# Patient Record
Sex: Male | Born: 1955 | Race: White | Hispanic: No | Marital: Married | Smoking: Former smoker
Health system: Southern US, Community
[De-identification: ages and names within clinical notes are randomized; demographics above are authoritative.]

## PROBLEM LIST (undated history)

## (undated) DIAGNOSIS — I10 Essential (primary) hypertension: Secondary | ICD-10-CM

## (undated) DIAGNOSIS — M1990 Unspecified osteoarthritis, unspecified site: Secondary | ICD-10-CM

## (undated) DIAGNOSIS — E119 Type 2 diabetes mellitus without complications: Secondary | ICD-10-CM

## (undated) DIAGNOSIS — I2510 Atherosclerotic heart disease of native coronary artery without angina pectoris: Secondary | ICD-10-CM

## (undated) DIAGNOSIS — E785 Hyperlipidemia, unspecified: Secondary | ICD-10-CM

## (undated) DIAGNOSIS — I639 Cerebral infarction, unspecified: Secondary | ICD-10-CM

## (undated) DIAGNOSIS — N209 Urinary calculus, unspecified: Secondary | ICD-10-CM

## (undated) DIAGNOSIS — C6290 Malignant neoplasm of unspecified testis, unspecified whether descended or undescended: Secondary | ICD-10-CM

## (undated) HISTORY — DX: Type 2 diabetes mellitus without complications: E11.9

## (undated) HISTORY — DX: Atherosclerotic heart disease of native coronary artery without angina pectoris: I25.10

## (undated) HISTORY — DX: Urinary calculus, unspecified: N20.9

## (undated) HISTORY — DX: Unspecified osteoarthritis, unspecified site: M19.90

## (undated) HISTORY — DX: Essential (primary) hypertension: I10

## (undated) HISTORY — DX: Hyperlipidemia, unspecified: E78.5

## (undated) HISTORY — PX: OTHER SURGICAL HISTORY: SHX169

## (undated) HISTORY — DX: Malignant neoplasm of unspecified testis, unspecified whether descended or undescended: C62.90

## (undated) HISTORY — DX: Cerebral infarction, unspecified: I63.9

## (undated) HISTORY — PX: COLONOSCOPY: SHX174

---

## 1976-09-28 DIAGNOSIS — C6290 Malignant neoplasm of unspecified testis, unspecified whether descended or undescended: Secondary | ICD-10-CM

## 1976-09-28 HISTORY — DX: Malignant neoplasm of unspecified testis, unspecified whether descended or undescended: C62.90

## 2003-09-29 HISTORY — PX: CORONARY STENT PLACEMENT: SHX1402

## 2004-07-14 ENCOUNTER — Inpatient Hospital Stay (HOSPITAL_COMMUNITY): Admission: AD | Admit: 2004-07-14 | Discharge: 2004-07-16 | Payer: Self-pay | Admitting: Endocrinology

## 2004-08-06 ENCOUNTER — Ambulatory Visit: Payer: Self-pay | Admitting: Cardiology

## 2004-09-03 ENCOUNTER — Ambulatory Visit: Payer: Self-pay | Admitting: Endocrinology

## 2004-11-24 ENCOUNTER — Ambulatory Visit: Payer: Self-pay | Admitting: Endocrinology

## 2004-11-28 ENCOUNTER — Ambulatory Visit: Payer: Self-pay | Admitting: Cardiology

## 2004-12-01 ENCOUNTER — Ambulatory Visit: Payer: Self-pay | Admitting: Endocrinology

## 2005-04-23 ENCOUNTER — Encounter: Payer: Self-pay | Admitting: Cardiology

## 2005-05-11 ENCOUNTER — Ambulatory Visit: Payer: Self-pay | Admitting: Endocrinology

## 2005-05-18 ENCOUNTER — Ambulatory Visit: Payer: Self-pay | Admitting: Endocrinology

## 2005-06-29 ENCOUNTER — Ambulatory Visit: Payer: Self-pay | Admitting: Cardiology

## 2005-07-06 ENCOUNTER — Ambulatory Visit: Payer: Self-pay | Admitting: Cardiology

## 2006-07-09 ENCOUNTER — Ambulatory Visit: Payer: Self-pay | Admitting: Endocrinology

## 2006-07-09 LAB — CONVERTED CEMR LAB
ALT: 22 units/L (ref 0–40)
AST: 23 units/L (ref 0–37)
Albumin: 3.6 g/dL (ref 3.5–5.2)
Alkaline Phosphatase: 74 units/L (ref 39–117)
BUN: 17 mg/dL (ref 6–23)
Basophils Absolute: 0 10*3/uL (ref 0.0–0.1)
Basophils Relative: 0.6 % (ref 0.0–1.0)
Bilirubin Urine: NEGATIVE
CO2: 27 meq/L (ref 19–32)
Calcium: 9.7 mg/dL (ref 8.4–10.5)
Chloride: 108 meq/L (ref 96–112)
Chol/HDL Ratio, serum: 3.1
Cholesterol: 127 mg/dL (ref 0–200)
Creatinine, Ser: 1.1 mg/dL (ref 0.4–1.5)
Eosinophil percent: 2 % (ref 0.0–5.0)
GFR calc non Af Amer: 75 mL/min
Glomerular Filtration Rate, Af Am: 90 mL/min/{1.73_m2}
Glucose, Bld: 107 mg/dL — ABNORMAL HIGH (ref 70–99)
HCT: 43.5 % (ref 39.0–52.0)
HDL: 40.6 mg/dL (ref >39.0)
Hemoglobin, Urine: NEGATIVE
Hemoglobin: 14.4 g/dL (ref 13.0–17.0)
Ketones, ur: NEGATIVE mg/dL
LDL Cholesterol: 67 mg/dL (ref 0–99)
Leukocytes, UA: NEGATIVE
Lymphocytes Relative: 24.1 % (ref 12.0–46.0)
MCHC: 33 g/dL (ref 30.0–36.0)
MCV: 91.8 fL (ref 78.0–100.0)
Monocytes Absolute: 0.5 10*3/uL (ref 0.2–0.7)
Monocytes Relative: 5.9 % (ref 3.0–11.0)
Neutro Abs: 5.1 10*3/uL (ref 1.4–7.7)
Neutrophils Relative %: 67.4 % (ref 43.0–77.0)
Nitrite: NEGATIVE
PSA: 0.22 ng/mL (ref 0.10–4.00)
Platelets: 109 10*3/uL — ABNORMAL LOW (ref 150–400)
Potassium: 5.4 meq/L — ABNORMAL HIGH (ref 3.5–5.1)
RBC: 4.74 M/uL (ref 4.22–5.81)
RDW: 13.4 % (ref 11.5–14.6)
Sodium: 142 meq/L (ref 135–145)
Specific Gravity, Urine: 1.02 (ref 1.000–1.03)
TSH: 1 microintl units/mL (ref 0.35–5.50)
Total Bilirubin: 0.6 mg/dL (ref 0.3–1.2)
Total Protein, Urine: NEGATIVE mg/dL
Total Protein: 6.5 g/dL (ref 6.0–8.3)
Triglyceride fasting, serum: 96 mg/dL (ref 0–149)
Urine Glucose: NEGATIVE mg/dL
Urobilinogen, UA: 0.2 (ref 0.0–1.0)
VLDL: 19 mg/dL (ref 0–40)
WBC: 7.7 10*3/uL (ref 4.5–10.5)
pH: 6 (ref 5.0–8.0)

## 2006-07-26 ENCOUNTER — Ambulatory Visit: Payer: Self-pay | Admitting: Cardiology

## 2006-07-27 ENCOUNTER — Ambulatory Visit: Payer: Self-pay | Admitting: Endocrinology

## 2007-02-25 ENCOUNTER — Ambulatory Visit: Payer: Self-pay | Admitting: Endocrinology

## 2007-02-25 LAB — CONVERTED CEMR LAB
ALT: 18 units/L (ref 0–40)
AST: 18 units/L (ref 0–37)
Albumin: 3.9 g/dL (ref 3.5–5.2)
Alkaline Phosphatase: 72 units/L (ref 39–117)
Bilirubin, Direct: 0.1 mg/dL (ref 0.0–0.3)
Cholesterol: 141 mg/dL (ref 0–200)
Creatinine,U: 134.8 mg/dL
HDL: 44.9 mg/dL (ref >39.0)
Hgb A1c MFr Bld: 6.3 % — ABNORMAL HIGH (ref 4.6–6.0)
LDL Cholesterol: 69 mg/dL (ref 0–99)
Microalb Creat Ratio: 4.5 mg/g (ref 0.0–30.0)
Microalb, Ur: 0.6 mg/dL (ref 0.0–1.9)
Total Bilirubin: 0.6 mg/dL (ref 0.3–1.2)
Total CHOL/HDL Ratio: 3.1
Total Protein: 7.3 g/dL (ref 6.0–8.3)
Triglycerides: 134 mg/dL (ref 0–149)
VLDL: 27 mg/dL (ref 0–40)

## 2007-03-11 ENCOUNTER — Ambulatory Visit: Payer: Self-pay | Admitting: Cardiology

## 2007-03-21 ENCOUNTER — Ambulatory Visit: Payer: Self-pay | Admitting: Cardiology

## 2007-04-07 ENCOUNTER — Ambulatory Visit: Payer: Self-pay | Admitting: Physician Assistant

## 2007-05-05 ENCOUNTER — Encounter: Payer: Self-pay | Admitting: Physician Assistant

## 2007-05-09 ENCOUNTER — Ambulatory Visit: Payer: Self-pay | Admitting: Cardiology

## 2007-07-02 ENCOUNTER — Encounter: Payer: Self-pay | Admitting: *Deleted

## 2007-07-02 DIAGNOSIS — K8020 Calculus of gallbladder without cholecystitis without obstruction: Secondary | ICD-10-CM | POA: Insufficient documentation

## 2007-07-02 DIAGNOSIS — I25118 Atherosclerotic heart disease of native coronary artery with other forms of angina pectoris: Secondary | ICD-10-CM | POA: Insufficient documentation

## 2007-07-02 DIAGNOSIS — E782 Mixed hyperlipidemia: Secondary | ICD-10-CM | POA: Insufficient documentation

## 2007-07-02 DIAGNOSIS — Z8547 Personal history of malignant neoplasm of testis: Secondary | ICD-10-CM | POA: Insufficient documentation

## 2007-07-02 DIAGNOSIS — I2510 Atherosclerotic heart disease of native coronary artery without angina pectoris: Secondary | ICD-10-CM | POA: Insufficient documentation

## 2007-07-02 DIAGNOSIS — F17200 Nicotine dependence, unspecified, uncomplicated: Secondary | ICD-10-CM | POA: Insufficient documentation

## 2007-07-25 ENCOUNTER — Ambulatory Visit: Payer: Self-pay | Admitting: Endocrinology

## 2007-07-25 LAB — CONVERTED CEMR LAB
Hgb A1c MFr Bld: 6.2 % — ABNORMAL HIGH (ref 4.6–6.0)
Sed Rate: 23 mm/hr — ABNORMAL HIGH (ref 0–20)

## 2007-07-29 ENCOUNTER — Ambulatory Visit: Payer: Self-pay

## 2007-08-12 ENCOUNTER — Telehealth (INDEPENDENT_AMBULATORY_CARE_PROVIDER_SITE_OTHER): Payer: Self-pay | Admitting: *Deleted

## 2008-07-05 ENCOUNTER — Ambulatory Visit: Payer: Self-pay | Admitting: Endocrinology

## 2008-07-07 LAB — CONVERTED CEMR LAB
ALT: 20 units/L (ref 0–53)
AST: 22 units/L (ref 0–37)
Albumin: 3.8 g/dL (ref 3.5–5.2)
Alkaline Phosphatase: 66 units/L (ref 39–117)
BUN: 17 mg/dL (ref 6–23)
Basophils Absolute: 0 10*3/uL (ref 0.0–0.1)
Basophils Relative: 0.5 % (ref 0.0–3.0)
Bilirubin Urine: NEGATIVE
Bilirubin, Direct: 0.1 mg/dL (ref 0.0–0.3)
CO2: 29 meq/L (ref 19–32)
Calcium: 9.7 mg/dL (ref 8.4–10.5)
Chloride: 103 meq/L (ref 96–112)
Cholesterol: 146 mg/dL (ref 0–200)
Creatinine, Ser: 1 mg/dL (ref 0.4–1.5)
Creatinine,U: 123.6 mg/dL
Eosinophils Absolute: 0.2 10*3/uL (ref 0.0–0.7)
Eosinophils Relative: 2.1 % (ref 0.0–5.0)
GFR calc Af Amer: 100 mL/min
GFR calc non Af Amer: 83 mL/min
Glucose, Bld: 120 mg/dL — ABNORMAL HIGH (ref 70–99)
HCT: 45.5 % (ref 39.0–52.0)
HDL: 41.4 mg/dL (ref >39.0)
Hemoglobin, Urine: NEGATIVE
Hemoglobin: 15.4 g/dL (ref 13.0–17.0)
Hgb A1c MFr Bld: 6.2 % — ABNORMAL HIGH (ref 4.6–6.0)
Ketones, ur: NEGATIVE mg/dL
LDL Cholesterol: 79 mg/dL (ref 0–99)
Leukocytes, UA: NEGATIVE
Lymphocytes Relative: 22.5 % (ref 12.0–46.0)
MCHC: 33.9 g/dL (ref 30.0–36.0)
MCV: 93.2 fL (ref 78.0–100.0)
Microalb Creat Ratio: 1.6 mg/g (ref 0.0–30.0)
Microalb, Ur: 0.2 mg/dL (ref 0.0–1.9)
Monocytes Absolute: 0.4 10*3/uL (ref 0.1–1.0)
Monocytes Relative: 5.8 % (ref 3.0–12.0)
Neutro Abs: 5.1 10*3/uL (ref 1.4–7.7)
Neutrophils Relative %: 69.1 % (ref 43.0–77.0)
Nitrite: NEGATIVE
PSA: 0.29 ng/mL (ref 0.10–4.00)
Platelets: 126 10*3/uL — ABNORMAL LOW (ref 150–400)
Potassium: 4.9 meq/L (ref 3.5–5.1)
RBC: 4.88 M/uL (ref 4.22–5.81)
RDW: 13.3 % (ref 11.5–14.6)
Sodium: 140 meq/L (ref 135–145)
Specific Gravity, Urine: 1.015 (ref 1.000–1.03)
TSH: 2.11 microintl units/mL (ref 0.35–5.50)
Total Bilirubin: 0.7 mg/dL (ref 0.3–1.2)
Total CHOL/HDL Ratio: 3.5
Total Protein, Urine: NEGATIVE mg/dL
Total Protein: 7.1 g/dL (ref 6.0–8.3)
Triglycerides: 127 mg/dL (ref 0–149)
Urine Glucose: NEGATIVE mg/dL
Urobilinogen, UA: 0.2 (ref 0.0–1.0)
VLDL: 25 mg/dL (ref 0–40)
WBC: 7.3 10*3/uL (ref 4.5–10.5)
pH: 7 (ref 5.0–8.0)

## 2008-07-13 ENCOUNTER — Ambulatory Visit: Payer: Self-pay | Admitting: Endocrinology

## 2008-07-13 DIAGNOSIS — R609 Edema, unspecified: Secondary | ICD-10-CM | POA: Insufficient documentation

## 2008-09-03 ENCOUNTER — Ambulatory Visit: Payer: Self-pay | Admitting: Cardiology

## 2008-09-27 ENCOUNTER — Ambulatory Visit: Payer: Self-pay | Admitting: Internal Medicine

## 2008-10-02 ENCOUNTER — Telehealth: Payer: Self-pay | Admitting: Endocrinology

## 2008-10-04 ENCOUNTER — Ambulatory Visit: Payer: Self-pay | Admitting: Internal Medicine

## 2008-10-04 ENCOUNTER — Telehealth: Payer: Self-pay | Admitting: Endocrinology

## 2008-10-04 LAB — HM COLONOSCOPY

## 2009-06-13 ENCOUNTER — Ambulatory Visit: Payer: Self-pay | Admitting: Endocrinology

## 2009-06-13 DIAGNOSIS — D696 Thrombocytopenia, unspecified: Secondary | ICD-10-CM | POA: Insufficient documentation

## 2009-06-13 DIAGNOSIS — I10 Essential (primary) hypertension: Secondary | ICD-10-CM | POA: Insufficient documentation

## 2009-06-17 ENCOUNTER — Telehealth: Payer: Self-pay | Admitting: Endocrinology

## 2009-06-21 ENCOUNTER — Ambulatory Visit: Payer: Self-pay | Admitting: Cardiology

## 2009-06-21 ENCOUNTER — Encounter (INDEPENDENT_AMBULATORY_CARE_PROVIDER_SITE_OTHER): Payer: Self-pay | Admitting: *Deleted

## 2009-06-21 DIAGNOSIS — R079 Chest pain, unspecified: Secondary | ICD-10-CM | POA: Insufficient documentation

## 2009-07-08 ENCOUNTER — Encounter: Payer: Self-pay | Admitting: Cardiology

## 2009-07-08 ENCOUNTER — Ambulatory Visit: Payer: Self-pay | Admitting: Cardiology

## 2009-07-15 ENCOUNTER — Encounter: Payer: Self-pay | Admitting: Endocrinology

## 2009-07-15 ENCOUNTER — Ambulatory Visit: Payer: Self-pay | Admitting: Endocrinology

## 2009-07-15 DIAGNOSIS — M542 Cervicalgia: Secondary | ICD-10-CM | POA: Insufficient documentation

## 2009-07-25 ENCOUNTER — Telehealth: Payer: Self-pay | Admitting: Endocrinology

## 2009-08-30 ENCOUNTER — Telehealth: Payer: Self-pay | Admitting: Endocrinology

## 2009-09-11 ENCOUNTER — Telehealth: Payer: Self-pay | Admitting: Endocrinology

## 2009-09-17 ENCOUNTER — Telehealth: Payer: Self-pay | Admitting: Endocrinology

## 2009-12-06 ENCOUNTER — Ambulatory Visit: Payer: Self-pay | Admitting: Cardiology

## 2009-12-19 ENCOUNTER — Telehealth: Payer: Self-pay | Admitting: Endocrinology

## 2010-02-04 ENCOUNTER — Encounter: Payer: Self-pay | Admitting: Cardiology

## 2010-03-27 ENCOUNTER — Encounter: Payer: Self-pay | Admitting: Endocrinology

## 2010-04-01 ENCOUNTER — Telehealth: Payer: Self-pay | Admitting: Endocrinology

## 2010-04-02 ENCOUNTER — Telehealth: Payer: Self-pay | Admitting: Endocrinology

## 2010-04-02 ENCOUNTER — Ambulatory Visit: Payer: Self-pay | Admitting: Endocrinology

## 2010-04-02 DIAGNOSIS — I6350 Cerebral infarction due to unspecified occlusion or stenosis of unspecified cerebral artery: Secondary | ICD-10-CM | POA: Insufficient documentation

## 2010-04-02 DIAGNOSIS — M1990 Unspecified osteoarthritis, unspecified site: Secondary | ICD-10-CM | POA: Insufficient documentation

## 2010-05-23 ENCOUNTER — Encounter: Payer: Self-pay | Admitting: Endocrinology

## 2010-07-07 ENCOUNTER — Telehealth: Payer: Self-pay | Admitting: Endocrinology

## 2010-08-01 ENCOUNTER — Ambulatory Visit: Payer: Self-pay | Admitting: Endocrinology

## 2010-08-02 LAB — CONVERTED CEMR LAB
ALT: 27 units/L (ref 0–53)
AST: 25 units/L (ref 0–37)
Albumin: 3.7 g/dL (ref 3.5–5.2)
Alkaline Phosphatase: 63 units/L (ref 39–117)
BUN: 14 mg/dL (ref 6–23)
Basophils Absolute: 0 10*3/uL (ref 0.0–0.1)
Basophils Relative: 0.6 % (ref 0.0–3.0)
Bilirubin Urine: NEGATIVE
Bilirubin, Direct: 0.1 mg/dL (ref 0.0–0.3)
CO2: 27 meq/L (ref 19–32)
Calcium: 9.4 mg/dL (ref 8.4–10.5)
Chloride: 106 meq/L (ref 96–112)
Cholesterol: 143 mg/dL (ref 0–200)
Creatinine, Ser: 1.1 mg/dL (ref 0.4–1.5)
Eosinophils Absolute: 0.2 10*3/uL (ref 0.0–0.7)
Eosinophils Relative: 3.1 % (ref 0.0–5.0)
GFR calc non Af Amer: 73.57 mL/min (ref >60)
Glucose, Bld: 101 mg/dL — ABNORMAL HIGH (ref 70–99)
HCT: 41.6 % (ref 39.0–52.0)
HDL: 52.7 mg/dL (ref >39.00)
Hemoglobin, Urine: NEGATIVE
Hemoglobin: 14.4 g/dL (ref 13.0–17.0)
Hgb A1c MFr Bld: 6.6 % — ABNORMAL HIGH (ref 4.6–6.5)
Ketones, ur: NEGATIVE mg/dL
LDL Cholesterol: 70 mg/dL (ref 0–99)
Leukocytes, UA: NEGATIVE
Lymphocytes Relative: 34.9 % (ref 12.0–46.0)
Lymphs Abs: 1.7 10*3/uL (ref 0.7–4.0)
MCHC: 34.7 g/dL (ref 30.0–36.0)
MCV: 93 fL (ref 78.0–100.0)
Monocytes Absolute: 0.4 10*3/uL (ref 0.1–1.0)
Monocytes Relative: 8.5 % (ref 3.0–12.0)
Neutro Abs: 2.6 10*3/uL (ref 1.4–7.7)
Neutrophils Relative %: 52.9 % (ref 43.0–77.0)
Nitrite: NEGATIVE
PSA: 0.18 ng/mL (ref 0.10–4.00)
Platelets: 139 10*3/uL — ABNORMAL LOW (ref 150.0–400.0)
Potassium: 5.4 meq/L — ABNORMAL HIGH (ref 3.5–5.1)
RBC: 4.47 M/uL (ref 4.22–5.81)
RDW: 13.7 % (ref 11.5–14.6)
Sodium: 140 meq/L (ref 135–145)
Specific Gravity, Urine: <=1.005 (ref 1.000–1.030)
TSH: 1.27 microintl units/mL (ref 0.35–5.50)
Total Bilirubin: 0.6 mg/dL (ref 0.3–1.2)
Total CHOL/HDL Ratio: 3
Total Protein, Urine: NEGATIVE mg/dL
Total Protein: 6.5 g/dL (ref 6.0–8.3)
Triglycerides: 102 mg/dL (ref 0.0–149.0)
Urine Glucose: NEGATIVE mg/dL
Urobilinogen, UA: 0.2 (ref 0.0–1.0)
VLDL: 20.4 mg/dL (ref 0.0–40.0)
WBC: 4.9 10*3/uL (ref 4.5–10.5)
pH: 6.5 (ref 5.0–8.0)

## 2010-08-08 ENCOUNTER — Ambulatory Visit: Payer: Self-pay | Admitting: Endocrinology

## 2010-08-08 ENCOUNTER — Encounter: Payer: Self-pay | Admitting: Endocrinology

## 2010-08-08 DIAGNOSIS — B079 Viral wart, unspecified: Secondary | ICD-10-CM | POA: Insufficient documentation

## 2010-10-26 LAB — CONVERTED CEMR LAB
ALT: 21 units/L (ref 0–53)
AST: 21 units/L (ref 0–37)
Albumin: 3.8 g/dL (ref 3.5–5.2)
Alkaline Phosphatase: 67 units/L (ref 39–117)
BUN: 12 mg/dL (ref 6–23)
Basophils Absolute: 0.2 10*3/uL — ABNORMAL HIGH (ref 0.0–0.1)
Basophils Relative: 3.1 % — ABNORMAL HIGH (ref 0.0–3.0)
Bilirubin Urine: NEGATIVE
Bilirubin, Direct: 0.1 mg/dL (ref 0.0–0.3)
CO2: 32 meq/L (ref 19–32)
Calcium: 9.8 mg/dL (ref 8.4–10.5)
Chloride: 104 meq/L (ref 96–112)
Cholesterol: 144 mg/dL (ref 0–200)
Creatinine, Ser: 1 mg/dL (ref 0.4–1.5)
Creatinine,U: 155.1 mg/dL
Eosinophils Absolute: 0.1 10*3/uL (ref 0.0–0.7)
Eosinophils Relative: 1.9 % (ref 0.0–5.0)
GFR calc non Af Amer: 82.46 mL/min (ref >60)
Glucose, Bld: 166 mg/dL — ABNORMAL HIGH (ref 70–99)
HCT: 44.5 % (ref 39.0–52.0)
HDL: 46.1 mg/dL (ref >39.00)
Hemoglobin, Urine: NEGATIVE
Hemoglobin: 15.3 g/dL (ref 13.0–17.0)
Hgb A1c MFr Bld: 6.3 % (ref 4.6–6.5)
Ketones, ur: NEGATIVE mg/dL
LDL Cholesterol: 71 mg/dL (ref 0–99)
Leukocytes, UA: NEGATIVE
Lymphocytes Relative: 27.9 % (ref 12.0–46.0)
Lymphs Abs: 2.1 10*3/uL (ref 0.7–4.0)
MCHC: 34.4 g/dL (ref 30.0–36.0)
MCV: 92.1 fL (ref 78.0–100.0)
Microalb Creat Ratio: 8.4 mg/g (ref 0.0–30.0)
Microalb, Ur: 1.3 mg/dL (ref 0.0–1.9)
Monocytes Absolute: 0.4 10*3/uL (ref 0.1–1.0)
Monocytes Relative: 5.4 % (ref 3.0–12.0)
Neutro Abs: 4.7 10*3/uL (ref 1.4–7.7)
Neutrophils Relative %: 61.7 % (ref 43.0–77.0)
Nitrite: NEGATIVE
Platelets: 126 10*3/uL — ABNORMAL LOW (ref 150.0–400.0)
Potassium: 4.2 meq/L (ref 3.5–5.1)
RBC: 4.83 M/uL (ref 4.22–5.81)
RDW: 13.7 % (ref 11.5–14.6)
Sodium: 143 meq/L (ref 135–145)
Specific Gravity, Urine: 1.02 (ref 1.000–1.030)
TSH: 1.39 microintl units/mL (ref 0.35–5.50)
Total Bilirubin: 0.7 mg/dL (ref 0.3–1.2)
Total CHOL/HDL Ratio: 3
Total Protein, Urine: NEGATIVE mg/dL
Total Protein: 7.3 g/dL (ref 6.0–8.3)
Triglycerides: 135 mg/dL (ref 0.0–149.0)
Urine Glucose: NEGATIVE mg/dL
Urobilinogen, UA: 0.2 (ref 0.0–1.0)
VLDL: 27 mg/dL (ref 0.0–40.0)
WBC: 7.5 10*3/uL (ref 4.5–10.5)
pH: 6 (ref 5.0–8.0)

## 2010-10-28 NOTE — Letter (Signed)
Summary: [facility]  [facility]   Imported By: Lester Aberdeen 04/07/2010 09:18:41  _____________________________________________________________________  External Attachment:    Type:   Image     Comment:   External Document

## 2010-10-28 NOTE — Assessment & Plan Note (Signed)
Summary: PHYSICAL  STC   Vital Signs:  Patient profile:   55 year old male Height:      72 inches (182.88 cm) Weight:      271.38 pounds (123.35 kg) BMI:     36.94 O2 Sat:      92 % on Room air Temp:     98.1 degrees F (36.72 degrees C) oral Pulse rate:   80 / minute BP sitting:   132 / 72  (left arm) Cuff size:   large  Vitals Entered By: Brenton Grills CMA Duncan Dull) (August 08, 2010 3:50 PM)  O2 Flow:  Room air CC: Physical/pt declined flu shot/aj Is Patient Diabetic? Yes   Primary Provider:  Dr. Romero Belling  CC:  Physical/pt declined flu shot/aj.  History of Present Illness: here for regular wellness examination.  He's feeling pretty well in general, and does not drink or smoke.  he wants to taper off the wellbutrin, as he quit smoking 3 mos ago.  Current Medications (verified): 1)  Fish Oil 1000 Mg  Caps (Omega-3 Fatty Acids) .... Take 1 By Mouth Qd 2)  Glucophage 500 Mg  Tabs (Metformin Hcl) .... Take 1 By Mouth Two Times A Day Qd 3)  Zocor 80 Mg  Tabs (Simvastatin) .... Take 1 Tab By Mouth At Bedtime 4)  Actos 30 Mg Tabs (Pioglitazone Hcl) .... Once Daily.qdtab 5)  Celebrex 200 Mg Caps (Celecoxib) .Marland Kitchen.. 1 Qd 6)  Nitrostat 0.4 Mg Subl (Nitroglycerin) .Marland Kitchen.. 1 Tablet Under Tongue At Onset of Chest Pain; You May Repeat Every 5 Minutes For Up To 3 Doses. 7)  Aggrenox 25-200 Mg Xr12h-Cap (Aspirin-Dipyridamole) .Marland Kitchen.. 1 Two Times A Day 8)  Bupropion Hcl 200 Mg Xr12h-Tab (Bupropion Hcl) .Marland Kitchen.. 1 Once Daily 9)  Tramadol Hcl 50 Mg Tabs (Tramadol Hcl) .Marland Kitchen.. 1 Every 4 Hrs As Needed For Pain  Allergies (verified): No Known Drug Allergies  Past History:  Past Medical History: Last updated: 12/06/2009 Cholelithiasis CAD - DES RCA 10/05 Diabetes Type 2 Hyperlipidemia Hypertension Testicular cancer 1978  Family History: Reviewed history from 12/06/2009 and no changes required. No FH of Colon Cancer Family History of Diabetes: Father  Social History: Reviewed history from  12/06/2009 and no changes required. Married IT trainer Patient currently smokes. -30 cigarrettes daily Alcohol Use - no Daily Caffeine Use-5-6 cups daily Illicit Drug Use - no Patient does not get regular exercise  Review of Systems       The patient complains of weight gain.  The patient denies fever, vision loss, decreased hearing, chest pain, syncope, dyspnea on exertion, prolonged cough, headaches, abdominal pain, melena, hematochezia, severe indigestion/heartburn, hematuria, suspicious skin lesions, and depression.    Physical Exam  General:  obese.  no distress  Head:  head: no deformity eyes: no periorbital swelling, no proptosis external nose and ears are normal mouth: no lesion seen Neck:  Supple without thyroid enlargement or tenderness.  Lungs:  Clear to auscultation bilaterally. Normal respiratory effort.  Heart:  Regular rate and rhythm without murmurs or gallops noted. Normal S1,S2.   Abdomen:  abdomen is soft, nontender.  no hepatosplenomegaly.   not distended.  no hernia  Rectal:  normal external and internal exam.  heme neg  Prostate:  Normal size prostate without masses or tenderness.  Msk:  muscle bulk and strength are grossly normal.  no obvious joint swelling.  gait is normal and steady  Pulses:  dorsalis pedis intact bilat.  no carotid bruit  Extremities:  no deformity.  no ulcer on the feet.  feet are of normal color and temp.  no edema  Neurologic:  cn 2-12 grossly intact.   readily moves all 4's.   sensation is intact to touch on the feet  Skin:  normal texture and temp.  no rash.  not diaphoretic  Cervical Nodes:  No significant adenopathy.  Psych:  Alert and cooperative; normal mood and affect; normal attention span and concentration.   Additional Exam:  SEPARATE EVALUATION AND TREATMENT FOLLOWS--EACH PROBLEM HERE IS NEW, NOT RESPONDING TO TREATMENT, OR POSES SIGNIFICANT RISK TO THE PATIENT'S HEALTH:  liquid nitrogen is applied to 2 warts on the  left thumb     Impression & Recommendations:  Problem # 1:  ROUTINE GENERAL MEDICAL EXAM@HEALTH  CARE FACL (ICD-V70.0)  Medications Added to Medication List This Visit: 1)  Bupropion Hcl 100 Mg Xr12h-tab (Bupropion hcl) .Marland Kitchen.. 1 tab once daily  Other Orders: EKG w/ Interpretation (93000) Est. Patient 40-64 years ([address]) Cryotherapy/Destruction benign or premalignant lesion (1st lesion)  (17000)  Patient Instructions: 1)  reduce buproprion to 100 mg once daily.  take this x 1 month, then stop it.   2)  please consider these measures for your health:  minimize alcohol.  do not use tobacco products.  have a colonoscopy at least every 10 years from age 49.  keep firearms safely stored.  always use seat belts.  have working smoke alarms in your home.  see an eye doctor and dentist regularly.  never drive under the influence of alcohol or drugs (including prescription drugs).  those with fair skin should take precautions against the sun. 3)  Please schedule a follow-up appointment in 6 months. Prescriptions: BUPROPION HCL 100 MG XR12H-TAB (BUPROPION HCL) 1 tab once daily  #30 x 0   Entered and Authorized by:   Minus Breeding MD   Signed by:   Minus Breeding MD on 08/08/2010   Method used:   [facility]  [address] [id number]       [address]       [place], [place]  [address]       Ph: [phone] or [phone]       Fax: [phone]   RxID:   [id number]    Orders Added: 1)  EKG w/ Interpretation [93000] 2)  Est. Patient 40-64 years [99396] 3)  Cryotherapy/Destruction benign or premalignant lesion (1st lesion)  [17000]

## 2010-10-28 NOTE — Letter (Signed)
Summary: [facility]  [facility]   Imported By: Sherian Rein 05/28/2010 11:30:39  _____________________________________________________________________  External Attachment:    Type:   Image     Comment:   External Document

## 2010-10-28 NOTE — Assessment & Plan Note (Signed)
Summary: PER DAHLIA FU--STC   Vital Signs:  Patient profile:   55 year old male Height:      72 inches (182.88 cm) Weight:      249 pounds (113.18 kg) BMI:     33.89 O2 Sat:      95 % on Room air Temp:     97.3 degrees F (36.28 degrees C) oral Pulse rate:   69 / minute BP sitting:   118 / 82 Cuff size:   large  Vitals Entered By: Brenton Grills MA (April 02, 2010 11:49 AM)  O2 Flow:  Room air CC: f/u appt pt was in hosp. a few weeks ago/wants to discuss quitting smoking/pt no longer taking adult aspirin 81mg  or plavix/aj Comments pt currently taking Aggrenox SA capsule- taking 1 cap every day for 3-4 days then 1 capsule two times a day    Primary Provider:  Dr. Romero Belling  CC:  f/u appt pt was in hosp. a few weeks ago/wants to discuss quitting smoking/pt no longer taking adult aspirin 81mg  or plavix/aj.  History of Present Illness: the status of at least 3 ongoing medical problems is addressed today: pt was in hospital ([facility]) last week with a small cva. he feels better in general. risk-factors wave been well-controlled, except for smoking.  he wants to quit, but he is concerned about the risk of chantix. pt says his Zeb Comfort has been worse since he was reassigned to a different job.  Current Medications (verified): 1)  Fish Oil 1000 Mg  Caps (Omega-3 Fatty Acids) .... Take 1 By Mouth Qd 2)  Adult Aspirin Low Strength 81 Mg  Tbdp (Aspirin) .... Take 1 By Mouth Qd 3)  Plavix 75 Mg  Tabs (Clopidogrel Bisulfate) .... Take 1 By Mouth Qd 4)  Glucophage 500 Mg  Tabs (Metformin Hcl) .... Take 1 By Mouth Two Times A Day Qd 5)  Zocor 80 Mg  Tabs (Simvastatin) .... Take 1 Tab By Mouth At Bedtime 6)  Actos 30 Mg Tabs (Pioglitazone Hcl) .... Once Daily.qdtab 7)  Celebrex 200 Mg Caps (Celecoxib) .Marland Kitchen.. 1 Qd 8)  Nitrostat 0.4 Mg Subl (Nitroglycerin) .Marland Kitchen.. 1 Tablet Under Tongue At Onset of Chest Pain; You May Repeat Every 5 Minutes For Up To 3 Doses.  Allergies (verified): No Known Drug  Allergies  Past History:  Past Medical History: Last updated: 12/06/2009 Cholelithiasis CAD - DES RCA 10/05 Diabetes Type 2 Hyperlipidemia Hypertension Testicular cancer 1978  Review of Systems       no further numbness.  he has pain in the lower back with lifting  Physical Exam  General:  obese.  no distress  Pulses:  dorsalis pedis intact bilat.  Extremities:  no deformity.  no ulcer on the feet.  feet are of normal color and temp.  no edema  Neurologic:  sensation is intact to touch on the feet    Impression & Recommendations:  Problem # 1:  CVA (ICD-434.91) Assessment Improved  Problem # 2:  SMOKER (ICD-305.1) this places him at risk for another cva  Problem # 3:  OSTEOARTHRITIS (ICD-715.90) pain needs increased rx  Medications Added to Medication List This Visit: 1)  Aggrenox 25-200 Mg Xr12h-cap (Aspirin-dipyridamole) .Marland Kitchen.. 1 two times a day 2)  Bupropion Hcl 200 Mg Xr12h-tab (Bupropion hcl) .Marland Kitchen.. 1 once daily 3)  Tramadol Hcl 50 Mg Tabs (Tramadol hcl) .Marland Kitchen.. 1 every 4 hrs as needed for pain  Other Orders: Est. Patient Level IV (91478)  Patient Instructions: 1)  continue aggrenox. 2)  add welbutrin-xl 200 mg once daily.  call if you wish to increase this to 300 mg once daily. 3)  physical is due in 2-3 months. 4)  same celebrex 5)  also take tramadol 50 mg every 4 hrs as needed for pain.   Prescriptions: TRAMADOL HCL 50 MG TABS (TRAMADOL HCL) 1 every 4 hrs as needed for pain  #50 x 3   Entered and Authorized by:   Minus Breeding MD   Signed by:   Minus Breeding MD on 04/02/2010   Method used:   [facility]        [facility]  [address] [id number]       [address]       [place], [place]  [address]       Ph: [phone] or [phone]       Fax: [phone]   RxID:   [id number] BUPROPION HCL 200 MG XR12H-TAB (BUPROPION HCL) 1 once daily  #30 x 11   Entered and Authorized by:   Minus Breeding MD   Signed by:   Minus Breeding MD on 04/02/2010   Method used:   [facility]        [facility]  [address] [id number]       [address]       [place], [place]  [address]       Ph: [phone] or [phone]       Fax: [phone]   RxID:   [id number]

## 2010-10-28 NOTE — Assessment & Plan Note (Signed)
Summary: 4 MO FU PER JAN REMINDER-SRS   Visit Type:  Follow-up Primary Provider:  Dr. Romero Belling   History of Present Illness: 55 year old male presents for a followup visit. He denies any significant angina or unusual breathlessness with activity. 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 He denies using any nitroglycerin.  We reviewed his stress testing from last year, detailed below, and overall reassuring. He continues to follow with Dr. Everardo All about every 6 months. Blood pressure and heart rate are well controlled today.   Preventive Screening-Counseling & Management  Alcohol-Tobacco     Smoking Status: current     Packs/Day: 1.5     Year Started: x 42 yrs  Current Medications (verified): 1)  Fish Oil 1000 Mg  Caps (Omega-3 Fatty Acids) .... Take 1 By Mouth Qd 2)  Adult Aspirin Low Strength 81 Mg  Tbdp (Aspirin) .... Take 1 By Mouth Qd 3)  Plavix 75 Mg  Tabs (Clopidogrel Bisulfate) .... Take 1 By Mouth Qd 4)  Glucophage 500 Mg  Tabs (Metformin Hcl) .... Take 1 By Mouth Two Times A Day Qd 5)  Zocor 80 Mg  Tabs (Simvastatin) .... Take 1 Tab By Mouth At Bedtime 6)  Actos 30 Mg Tabs (Pioglitazone Hcl) .... Once Daily.qdtab 7)  Celebrex 200 Mg Caps (Celecoxib) .Marland Kitchen.. 1 Qd 8)  Nitrolingual 0.4 Mg/spray Soln (Nitroglycerin) .... 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 -30 cigarrettes daily Alcohol Use - no Daily Caffeine Use-5-6 cups daily Illicit Drug Use - no Patient does not get regular exercise  Review of Systems  The patient denies anorexia, fever, chest pain, syncope, peripheral edema, prolonged cough, headaches, hemoptysis, abdominal pain, melena, and hematochezia.         He has had some medial right elbow discomfort that sounds potentially related to tendon or ligament pain. I asked him to discuss this with his primary care provider if symptoms persist. Otherwise reviewed and negative.  Vital Signs:  Patient profile:   55 year old male Height:      72 inches Weight:      253.4 pounds Pulse rate:   53 / minute BP sitting:   119 / 72  (left arm) Cuff size:   large  Vitals Entered By: Hoover Brunette, LPN (December 06, 2009 3:46 PM) Is Patient Diabetic? Yes Comments routine f/u   Physical Exam  Additional Exam:  Overweight male in no acute distress. HEENT: Conjunctiva and lids normal, oropharynx clear. Neck: Supple, no carotid bruits or thyromegaly. Lungs: Clear to auscultation, nonlabored. Cardiac: Regular rate and rhythm, no S3. Extremity: No pitting edema, distal pulses 2+.    Nuclear Study  Procedure date:  07/08/2009  Findings:      [facility] Cardiolite:  No diagnostic ST segment changes noted. Probable inferior wall scar with very mild ischemia in the inferior apex, overall low risk. LVEF 58%.  Impression & Recommendations:  Problem #  1:  CORONARY ARTERY DISEASE (ICD-414.00)  Symptomatically stable on medical therapy with history of drug eluding stent placement to the right coronary artery in 2005. Followup Cardiolite in October of last year was overall reassuring in the absence of active symptoms. I reminded Mr. Boudoin to be observant for any change in his functional capacity, development of chest pain, or development of unusual shortness of breath. He will otherwise  continue to follow up Dr. Everardo All on medical therapy aimed at risk factor modification, I will plan to see him back in one year's time. Refills were given for nitroglycerin, Zocor, and Plavix.  His updated medication list for this problem includes:    Adult Aspirin Low Strength 81 Mg Tbdp (Aspirin) .Marland Kitchen... Take 1 by mouth qd    Plavix 75 Mg Tabs (Clopidogrel bisulfate) .Marland Kitchen... Take 1 by mouth qd    Nitrostat 0.4 Mg Subl (Nitroglycerin) .Marland Kitchen... 1 tablet under tongue at onset of chest pain; you may repeat every 5 minutes for up to 3 doses.  Problem # 2:  HYPERLIPIDEMIA (ICD-272.4)  Followed by Dr. Everardo All, and tolerating omega-3 supplements as well as Zocor. Would aim for LDL control around 70. His updated medication list for this problem includes:    Zocor 80 Mg Tabs (Simvastatin) .Marland Kitchen... Take 1 tab by mouth at bedtime  Problem # 3:  HYPERTENSION (ICD-401.9)  Blood pressure well controlled today.  His updated medication list for this problem includes:    Adult Aspirin Low Strength 81 Mg Tbdp (Aspirin) .Marland Kitchen... Take 1 by mouth qd  His updated medication list for this problem includes:    Adult Aspirin Low Strength 81 Mg Tbdp (Aspirin) .Marland Kitchen... Take 1 by mouth qd  Patient Instructions: 1)  Your physician wants you to follow-up in: 1 year. You will receive a reminder letter in the mail one-two months in advance. If you don't receive a letter, please call our office to schedule the follow-up appointment. 2)  Your physician recommends that you continue on your current medications as directed. Please refer to the Current Medication list given to you today. Prescriptions: NITROSTAT 0.4 MG SUBL (NITROGLYCERIN) 1 tablet under tongue at onset of chest pain; you may repeat every 5 minutes for up to 3 doses.  #25 x 3   Entered by:   Cyril Loosen, RN, BSN   Authorized by:   Loreli Slot, MD, [facility]   Signed by:   Cyril Loosen, RN, BSN on 12/06/2009   Method used:   [facility]        [facility]  [address]  [address] [id number]       [address]       [place], [place]  [address]       Ph: [phone] or [phone]       Fax: [phone]   RxID:   [id number] PLAVIX 75 MG  TABS (CLOPIDOGREL BISULFATE) take 1 by mouth qd  #90 x 3   Entered by:   Cyril Loosen, RN, BSN   Authorized by:   Loreli Slot, MD, [facility]   Signed by:   Cyril Loosen, RN, BSN on 12/06/2009   Method used:   Electronically to        [facility]* (mail-order)             ,          Ph: [phone]       Fax: [phone]   RxID:  [id number] ZOCOR 80 MG  TABS (SIMVASTATIN) Take 1 tab by mouth at bedtime  #90 x 3   Entered by:   Cyril Loosen, RN, BSN   Authorized by:   Loreli Slot, MD, [facility]   Signed by:   Cyril Loosen, RN, BSN on 12/06/2009   Method used:   Electronically to        [facility]* (mail-order)             ,          Ph: [phone]       Fax: [phone]   RxID:   [id number]

## 2010-10-28 NOTE — Letter (Signed)
Summary: Occupation Health: Request for Info at [facility]  Occupation Health: Request for Info at [facility]   Imported By: Cyril Loosen, RN, BSN 02/04/2010 11:06:31  _____________________________________________________________________  External Attachment:    Type:   Image     Comment:   External Document

## 2010-10-28 NOTE — Progress Notes (Signed)
  Phone Note Call from Patient Call back at [phone]   Caller: Spouse Summary of Call: Pt's spouse left message on triage about where rx's were sent--appt today Initial call taken by: Brenton Grills MA,  April 02, 2010 2:04 PM  Follow-up for Phone Call        Informed [facility] [address] [facility] [place]. Follow-up by: Brenton Grills MA,  April 02, 2010 2:05 PM

## 2010-10-28 NOTE — Progress Notes (Signed)
  Phone Note Refill Request Message from:  Fax from Pharmacy on December 19, 2009 11:29 AM  Refills Requested: Medication #1:  GLUCOPHAGE 500 MG  TABS take 1 by mouth two times a day qd   Dosage confirmed as above?Dosage Confirmed RX sent to [facility]  Initial call taken by: Josph Macho RMA,  December 19, 2009 11:29 AM    Prescriptions: GLUCOPHAGE 500 MG  TABS (METFORMIN HCL) take 1 by mouth two times a day qd  #180 x 2   Entered by:   Josph Macho RMA   Authorized by:   Minus Breeding MD   Signed by:   Josph Macho RMA on 12/19/2009   Method used:   Electronically to        [facility] (mail-order)             ,          Ph: [phone]       Fax: [phone]   RxID:   [id number]

## 2010-10-28 NOTE — Progress Notes (Signed)
Summary: Rx refill req  Phone Note Refill Request Message from:  Patient on July 07, 2010 11:14 AM  Refills Requested: Medication #1:  ACTOS 30 MG TABS once daily.qdtab   Dosage confirmed as above?Dosage Confirmed   Supply Requested: 3 months   Notes: Rx to [facility]  Method Requested: Electronic Initial call taken by: Margaret Pyle, CMA,  July 07, 2010 11:14 AM    Prescriptions: ACTOS 30 MG TABS (PIOGLITAZONE HCL) once daily.qdtab  #90 x 0   Entered by:   Margaret Pyle, CMA   Authorized by:   Minus Breeding MD   Signed by:   Margaret Pyle, CMA on 07/07/2010   Method used:   Electronically to        [facility]* (retail)             ,          Ph: [phone]       Fax: [phone]   RxID:   [id number]

## 2010-10-28 NOTE — Progress Notes (Signed)
Summary: Rx request  Phone Note Call from Patient   Caller: Spouse  Summary of Call: Pt spouse called requesting Rx for Chantix to [facility] pharmacy Initial call taken by: Margaret Pyle, CMA,  April 01, 2010 11:47 AM  Follow-up for Phone Call        ov is due.  let's take care of it then Follow-up by: Minus Breeding MD,  April 01, 2010 12:31 PM  Additional Follow-up for Phone Call Additional follow up Details #1::        Pt informed and transferred to sch appt Additional Follow-up by: Margaret Pyle, CMA,  April 01, 2010 3:49 PM

## 2011-01-06 ENCOUNTER — Ambulatory Visit: Payer: Self-pay | Admitting: Cardiology

## 2011-01-12 LAB — GLUCOSE, CAPILLARY
Glucose-Capillary: 102 mg/dL — ABNORMAL HIGH (ref 70–99)
Glucose-Capillary: 108 mg/dL — ABNORMAL HIGH (ref 70–99)

## 2011-01-26 ENCOUNTER — Telehealth: Payer: Self-pay

## 2011-01-26 DIAGNOSIS — D696 Thrombocytopenia, unspecified: Secondary | ICD-10-CM

## 2011-01-26 DIAGNOSIS — E875 Hyperkalemia: Secondary | ICD-10-CM

## 2011-01-26 DIAGNOSIS — E119 Type 2 diabetes mellitus without complications: Secondary | ICD-10-CM

## 2011-01-26 NOTE — Telephone Encounter (Signed)
Pt's spouse advised 

## 2011-01-26 NOTE — Telephone Encounter (Signed)
Pt's wife called to requesting pt had labs prior to appt scheduled for 05/11. Okay to schedule labs?

## 2011-01-26 NOTE — Telephone Encounter (Signed)
i ordered

## 2011-02-02 ENCOUNTER — Other Ambulatory Visit (INDEPENDENT_AMBULATORY_CARE_PROVIDER_SITE_OTHER): Payer: Self-pay

## 2011-02-02 DIAGNOSIS — E119 Type 2 diabetes mellitus without complications: Secondary | ICD-10-CM

## 2011-02-02 DIAGNOSIS — D696 Thrombocytopenia, unspecified: Secondary | ICD-10-CM

## 2011-02-02 DIAGNOSIS — E875 Hyperkalemia: Secondary | ICD-10-CM

## 2011-02-02 LAB — CBC WITH DIFFERENTIAL/PLATELET
Basophils Absolute: 0 10*3/uL (ref 0.0–0.1)
Basophils Relative: 0.3 % (ref 0.0–3.0)
Eosinophils Absolute: 0.1 10*3/uL (ref 0.0–0.7)
Eosinophils Relative: 2.3 % (ref 0.0–5.0)
HCT: 43.8 % (ref 39.0–52.0)
Hemoglobin: 15.2 g/dL (ref 13.0–17.0)
Lymphocytes Relative: 29.8 % (ref 12.0–46.0)
Lymphs Abs: 1.7 10*3/uL (ref 0.7–4.0)
MCHC: 34.7 g/dL (ref 30.0–36.0)
MCV: 91.9 fl (ref 78.0–100.0)
Monocytes Absolute: 0.4 10*3/uL (ref 0.1–1.0)
Monocytes Relative: 7.6 % (ref 3.0–12.0)
Neutro Abs: 3.4 10*3/uL (ref 1.4–7.7)
Neutrophils Relative %: 60 % (ref 43.0–77.0)
Platelets: 143 10*3/uL — ABNORMAL LOW (ref 150.0–400.0)
RBC: 4.77 Mil/uL (ref 4.22–5.81)
RDW: 13.3 % (ref 11.5–14.6)
WBC: 5.7 10*3/uL (ref 4.5–10.5)

## 2011-02-02 LAB — BASIC METABOLIC PANEL
BUN: 15 mg/dL (ref 6–23)
CO2: 27 mEq/L (ref 19–32)
Calcium: 10 mg/dL (ref 8.4–10.5)
Chloride: 103 mEq/L (ref 96–112)
Creatinine, Ser: 1.1 mg/dL (ref 0.4–1.5)
GFR: 75.81 mL/min (ref >60.00)
Glucose, Bld: 120 mg/dL — ABNORMAL HIGH (ref 70–99)
Potassium: 5.3 mEq/L — ABNORMAL HIGH (ref 3.5–5.1)
Sodium: 137 mEq/L (ref 135–145)

## 2011-02-02 LAB — HEMOGLOBIN A1C: Hgb A1c MFr Bld: 7.5 % — ABNORMAL HIGH (ref 4.6–6.5)

## 2011-02-05 ENCOUNTER — Encounter: Payer: Self-pay | Admitting: Cardiology

## 2011-02-06 ENCOUNTER — Encounter: Payer: Self-pay | Admitting: Cardiology

## 2011-02-06 ENCOUNTER — Encounter: Payer: Self-pay | Admitting: Endocrinology

## 2011-02-06 ENCOUNTER — Ambulatory Visit (INDEPENDENT_AMBULATORY_CARE_PROVIDER_SITE_OTHER): Payer: BC Managed Care – PPO | Admitting: Cardiology

## 2011-02-06 ENCOUNTER — Ambulatory Visit: Payer: Self-pay | Admitting: Cardiology

## 2011-02-06 ENCOUNTER — Ambulatory Visit (INDEPENDENT_AMBULATORY_CARE_PROVIDER_SITE_OTHER): Payer: BC Managed Care – PPO | Admitting: Endocrinology

## 2011-02-06 VITALS — BP 142/87 | HR 81 | Ht 72.0 in | Wt 286.0 lb

## 2011-02-06 VITALS — BP 134/72 | HR 82 | Temp 97.7°F | Ht 73.0 in | Wt 288.0 lb

## 2011-02-06 DIAGNOSIS — E119 Type 2 diabetes mellitus without complications: Secondary | ICD-10-CM

## 2011-02-06 DIAGNOSIS — I2510 Atherosclerotic heart disease of native coronary artery without angina pectoris: Secondary | ICD-10-CM

## 2011-02-06 DIAGNOSIS — I6350 Cerebral infarction due to unspecified occlusion or stenosis of unspecified cerebral artery: Secondary | ICD-10-CM

## 2011-02-06 DIAGNOSIS — E785 Hyperlipidemia, unspecified: Secondary | ICD-10-CM

## 2011-02-06 DIAGNOSIS — I10 Essential (primary) hypertension: Secondary | ICD-10-CM

## 2011-02-06 MED ORDER — SITAGLIPTIN PHOSPHATE 100 MG PO TABS: 100.0000 mg | ORAL_TABLET | Freq: Every day | ORAL | Status: DC

## 2011-02-06 MED ORDER — COLESEVELAM HCL 625 MG PO TABS: 1875.0000 mg | ORAL_TABLET | Freq: Two times a day (BID) | ORAL | Status: DC

## 2011-02-06 NOTE — Patient Instructions (Signed)
Your physician wants you to follow-up in: 6 months. You will receive a reminder letter in the mail one-two months in advance. If you don't receive a letter, please call our office to schedule the follow-up appointment. Your physician recommends that you continue on your current medications as directed. Please refer to the Current Medication list given to you today. 

## 2011-02-06 NOTE — Assessment & Plan Note (Signed)
Recent LDL at goal. He is on high-dose simvastatin, although has tolerated this long-term. Could consider switching to high-dose Lipitor.

## 2011-02-06 NOTE — Assessment & Plan Note (Signed)
Blood pressure trend up, likely related to increased weight. Sodium restriction, diet and exercise discussed.

## 2011-02-06 NOTE — Assessment & Plan Note (Signed)
Followed by Dr. Everardo All, recent medication adjustments made.

## 2011-02-06 NOTE — Progress Notes (Signed)
  Subjective:    Patient ID: [name] [name], male    DOB: July 15, 1956, 55 y.o.   MRN: [id number]  HPI Pt states 1 year of moderate edema of the legs, and assoc weight gain. no cbg record, but states cbg's are in the low-100's. Past Medical History  Diagnosis Date  . Coronary atherosclerosis of native coronary artery     DES RCA 10/05  . Stroke     Left pons 6/11 - Aggrenox started  . Mixed hyperlipidemia   . Type 2 diabetes mellitus   . Cholelithiasis   . Testicular cancer 1978    Past Surgical History  Procedure Date  . Testicular tumor resection     Right side    History   Social History  . Marital Status: Married    Spouse Name: N/A    Number of Children: 0  . Years of Education: N/A   Occupational History  . Truck driver    Social History Main Topics  . Smoking status: Former Smoker -- 1.5 packs/day for 43 years    Types: Cigarettes    Quit date: 04/21/2010  . Smokeless tobacco: Never Used  . Alcohol Use: No  . Drug Use: No  . Sexually Active: Not on file   Other Topics Concern  . Not on file   Social History Narrative  . No narrative on file    Current Outpatient Prescriptions on File Prior to Visit  Medication Sig Dispense Refill  . celecoxib (CELEBREX) 200 MG capsule Take 200 mg by mouth daily as needed.       . dipyridamole-aspirin (AGGRENOX) 25-200 MG per 12 hr capsule Take 1 capsule by mouth 2 (two) times daily.        . fish oil-omega-3 fatty acids 1000 MG capsule Take 1 capsule by mouth daily.        . metFORMIN (GLUCOPHAGE) 500 MG tablet Take 500 mg by mouth 2 (two) times daily with a meal.        . nitroGLYCERIN (NITROSTAT) 0.4 MG SL tablet Place 0.4 mg under the tongue every 5 (five) minutes as needed. May repeat every 5 minutes for up to 3 doses       . simvastatin (ZOCOR) 80 MG tablet Take 80 mg by mouth at bedtime.          No Known Allergies  Family History  Problem Relation Age of Onset  . Diabetes type II Father     BP 134/72   Pulse 82  Temp(Src) 97.7 F (36.5 C) (Oral)  Ht 6\' 1"  (1.854 m)  Wt 288 lb (130.636 kg)  BMI 38.00 kg/m2  SpO2 97%    Review of Systems Denies sob and nausea.    Objective:   Physical Exam Pulses: dorsalis pedis intact bilat.   Feet: no deformity.  no ulcer on the feet.  feet are of normal color and temp.  1+ bilat leg edema Neuro: sensation is intact to touch on the feet. Lab Results  Component Value Date   HGBA1C 7.5* 02/02/2011     Assessment & Plan:  Edema, prob due to actos Dm, needs increased rx Dyslipidemia.  welchol would help.

## 2011-02-06 NOTE — Progress Notes (Signed)
Clinical Summary [name] [name] is a 55 y.o.male presenting for followup. He was seen in March 2011.  He has quit smoking, however gained a significant amount of weight, at least 30 pounds. 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 We discussed diet and exercise today, also possible referral to a nutritionist, although he did not want to do that as yet. Reports compliance with his medications. Some were recently switched by Dr. Everardo [name].  Lab work from May 7 showed hemoglobin A1c 7.5, sodium 137, potassium 5.3, BUN 15, creatinine 1.1, hemoglobin 15.2, platelets 143. Previous lipids from November 2011 showed cholesterol 143, crit was rides 102, HDL 52, LDL 70.  ECG is normal. Most recent followup Cardiolite is noted below.  No Known Allergies  Current outpatient prescriptions:celecoxib (CELEBREX) 200 MG capsule, Take 200 mg by mouth daily as needed. , Disp: , Rfl: ;  colesevelam (WELCHOL) 625 MG tablet, Take 3 tablets (1,875 mg total) by mouth 2 (two) times daily with a meal., Disp: 540 tablet, Rfl: 3;  dipyridamole-aspirin (AGGRENOX) 25-200 MG per 12 hr capsule, Take 1 capsule by mouth 2 (two) times daily.  , Disp: , Rfl:  fish oil-omega-3 fatty acids 1000 MG capsule, Take 1 capsule by mouth daily.  , Disp: , Rfl: ;  metFORMIN (GLUCOPHAGE) 500 MG tablet, Take 500 mg by mouth 2 (two) times daily with a meal.  , Disp: , Rfl: ;  nitroGLYCERIN (NITROSTAT) 0.4 MG SL tablet, Place 0.4 mg under the tongue every 5 (five) minutes as needed. May repeat every 5 minutes for up to 3 doses , Disp: , Rfl:  simvastatin (ZOCOR) 80 MG tablet, Take 80 mg by mouth at bedtime.  , Disp: , Rfl: ;  sitaGLIPtan (JANUVIA) 100 MG tablet, Take 1 tablet (100 mg total) by mouth daily., Disp: 90 tablet, Rfl: 3;  DISCONTD: buPROPion (WELLBUTRIN SR) 100 MG 12 hr tablet, Take 100 mg by mouth daily.  , Disp: , Rfl: ;  DISCONTD: pioglitazone (ACTOS) 30 MG tablet, Take 30 mg by mouth daily.  ,  Disp: , Rfl:  DISCONTD: traMADol (ULTRAM) 50 MG tablet, Take 50 mg by mouth every 4 (four) hours as needed. For pain , Disp: , Rfl:   Past Medical History  Diagnosis Date  . Coronary atherosclerosis of native coronary artery     DES RCA 10/05  . Stroke     Left pons 6/11 - Aggrenox started  . Mixed hyperlipidemia   . Type 2 diabetes mellitus   . Cholelithiasis   . Testicular cancer 1978    Social History [name] [name] reports that he quit smoking about 9 months ago. His smoking use included Cigarettes. He has a 64.5 pack-year smoking history. He has never used smokeless tobacco. [name] [name] reports that he does not drink alcohol.  Review of Systems No palpitations or syncope. No melena or hematochezia. Otherwise reviewed and negative.  Physical Examination Filed Vitals:   02/06/11 1449  BP: 142/87  Pulse: 81  Obese male in no acute distress. HEENT: Conjunctiva and lids normal, oropharynx clear. Neck: Supple, no carotid bruits or thyromegaly. Lungs: Clear to auscultation, nonlabored. Cardiac: Regular rate and rhythm, no S3. Abdomen: Obese, nontender, bowel sounds present. Skin: Warm and dry. Musculoskeletal: No kyphosis. Extremity: No pitting edema, distal pulses 2+. Neuropsychiatric: Alert and oriented x3, affect appropriate.  ECG Normal sinus rhythm at 75.  Studies Cardiolite 07/08/2009: No diagnostic ST segment changes noted. Probable inferior wall scar  with very mild ischemia in the inferior apex, overall low risk. LVEF 58%.  Problem List and Plan

## 2011-02-06 NOTE — Patient Instructions (Addendum)
good diet and exercise habits significanly improve the control of your diabetes.  please let me know if you wish to be referred to a dietician, or for weight-loss surgery.  high blood sugar is very risky to your health.  you should see an eye doctor every year. controlling your blood pressure and cholesterol drastically reduces the damage diabetes does to your body.  this also applies to quitting smoking.  please discuss these with your doctor.  you should take an aspirin every day, unless you have been advised by a doctor not to. check your blood sugar 1 time a day.  vary the time of day when you check, between before the 3 meals, and at bedtime.  also check if you have symptoms of your blood sugar being too high or too low.  please keep a record of the readings and bring it to your next appointment here.  please call us sooner if you are having low blood sugar episodes.  Please make a follow-up appointment in 3 months.  Stop actos.  Add welchol 6x625 mg daily (with any meal, but not along with other medications), and januvia 100 mg each morning.

## 2011-02-06 NOTE — Assessment & Plan Note (Signed)
Clinically stable on medical therapy. ECG is normal. At this point we plan observation. I spoke with the patient and his wife about the importance of diet and exercise with goal of weight loss.

## 2011-02-06 NOTE — Assessment & Plan Note (Signed)
Left pons stroke in June of last year, now on Aggrenox. Clinically stable by report.

## 2011-02-10 NOTE — Assessment & Plan Note (Signed)
[facility]                          [facility]   [name] [name], [name] [name]                      MRN:          [id number]  DATE:03/11/2007                            DOB:          1956-05-01    PRIMARY CARE PHYSICIAN:  Sean A. Everardo [name], M.D.   REASON FOR VISIT:  Cardiac followup and shortness of breath.   HISTORY OF PRESENT ILLNESS:  I saw [name] [name] back in October of 2006.  Since then, he was seen by [name] [name] in October of 2007.  He reports  generally doing fairly well, without angina, although he has had  progressive dyspnea on exertion over the last three months.  He reports  gaining a significant amount of weight in the interim, although his  weights by our scales are only up two pounds over the last year.  He  reports being compliant with his medications.  His resting  electrocardiogram today is normal.  He has not had any interval ischemic  testing since his intervention with thrombectomy and drug eluting stent  placement to the right coronary artery in 2005.  He has had no problem  with cough, hemoptysis, wheezing, orthopnea or PND.  He has had some  lower extremity edema.   ALLERGIES:  No known drug allergies.   PRESENT MEDICATIONS:  1. Actos 45 mg p.o. daily.  2. Metformin 500 mg p.o. b.i.d.  3. Plavix 75 mg p.o. daily.  4. Fish oil 1000 mg p.o. b.i.d.  5. Lipitor 40 mg p.o. daily.  6. Aspirin 81 mg p.o. daily.  7. Avalide 12.5 mg p.o. daily.  8. Sublingual nitroglycerin 0.4 mg p.r.n.   REVIEW OF SYSTEMS:  As described in History of Present Illness.   EXAMINATION:  Blood pressure 99/65, heart rate is 83, weight is 246.6  pounds.  This is an obese male, in no acute distress, without active  chest pain or dizziness.  HEENT:  Conjunctiva is normal.  Oropharynx is clear.  NECK:  Supple, no jugular venous pressure, without bruits.  No  thyromegaly is noted.  LUNGS:  Clear without labored breathing at rest.  CARDIAC EXAM:   Reveals a regular rate and rhythm, no S3 gallop or  pericardial rub.  ABDOMEN:  Soft, nontender, normoactive bowel sounds.  EXTREMITIES:  Exhibit trace edema below the knees.  Skin is warm and  dry.  Distal pulses are 2+.  MUSCULOSKELETAL:  No kyphosis is noted.  NEUROPSYCHIATRIC:  Patient is alert and oriented times three.   IMPRESSION/RECOMMENDATIONS:  Coronary artery disease, status post  thrombectomy and drug eluting stent placement to the right coronary  artery in October of 2005, associated with normal left ventricular  systolic function.  He has manifested progressive dyspnea on exertion,  although no obvious chest pain.  Resting electrocardiogram is normal.  We will plan a followup exercise Myoview and also, given his lower  extremity edema, an echocardiogram.  I will then have him follow up in  the office over the next two to three weeks for review.     [name] Sidle, MD  Electronically Signed  SGM/MedQ  DD: 03/11/2007  DT: 03/11/2007  Job #: [id number]   cc:   [name] Signs A. Everardo All, MD

## 2011-02-10 NOTE — Assessment & Plan Note (Signed)
[facility]                          [facility]   SYNCERE, EBLE                      MRN:          [id number]  DATE:04/07/2007                            DOB:          1956-07-12    CARDIOLOGY OFFICE NOTE   ENDOCRINOLOGIST:  Cleophas Dunker. Everardo All, M.D.   HISTORY OF PRESENT ILLNESS:  [name] [name] is a 55 year old male patient  followed by Dr. Diona Browner with a history of coronary artery disease  status post thrombectomy and CYPHER stenting to the distal RCA October  2005, who presents to the office today for followup.  He last saw Dr.  Diona Browner on March 11, 2007 with complaints of dyspnea on exertion.  At  that time, he was set up for a stress Cardiolite study and  echocardiogram.  The patient exercised for 9 minutes and achieved a work  level of 10.4 METS.  His nuclear images were negative for ischemia.  His  echocardiogram revealed normal left ventricular function with an EF of  65%.  He returns today for followup.  He notes that he is doing well.  He actually feels like his shortness of breath is better since he  learned the negative results of his test.  He denies any wheezing.  His  wife says he does have a nonproductive cough.  He also snores and she  thinks he has some apneic episodes.  He really denies any true daytime  hypersomnolence.  He does continue to smoke about a pack-and-a-half per  day.  He does admit to smoking for 40+ years.   CURRENT MEDICATIONS:  1. Actos 45 mg a day.  2. Metformin 500 mg b.i.d.  3. Plavix 75 mg daily.  4. Fish oil.  5. Lipitor 40 mg daily.  6. Aspirin 81 mg daily.  7. Avalide ?/12.5 mg daily.  8. Nitroglycerin p.r.n. chest pain.   ALLERGIES:  No known drug allergies.   PHYSICAL EXAM:  He is a well-developed, well-nourished male in no acute  distress.  Blood pressure 124/77, pulse 73, weight 242 pounds.  HEENT:  Normal.  NECK:  Without JVD.  CARDIAC:  S1, S2.  Regular rate and rhythm  without murmurs.  LUNGS:  Clear to auscultation bilaterally without wheeze, rales, or  rhonchi.  ABDOMEN:  Soft and nontender with normoactive bowel sounds.  No  organomegaly.  EXTREMITIES:  Without edema.  Calves soft and nontender.  SKIN:  Warm and dry.  NEUROLOGIC:  He is alert and oriented x3.  Cranial nerves 2-12 grossly  intact.   IMPRESSION:  1. Dyspnea on exertion.  2. Coronary artery disease.      a.     Status post thrombectomy/CYPHER stenting to the distal right       coronary artery October 2005.  3. Good left ventricular function.  4. Recent negative Cardiolite study.  5. Hyperlipidemia.  6. Diabetes mellitus.  7. History of mild thrombocytopenia.  8. Continued tobacco abuse.      a.     Rule out chronic obstructive pulmonary disease.   PLAN:  The patient presents to the office today  for followup on his  dyspnea on exertion and recent testing.  As noted above, the stress test  and echocardiogram were both normal.  No further cardiovascular testing  is warranted at this time.  I had a long talk with the patient about  smoking.  I think he probably has a degree of COPD.  I recommended a  chest x-ray and pulmonary function test with DLCO.  If he has an  obstructive lung defect, then he would require followup with his primary  care physician.  He is going to check with Dr. Everardo All to see if he can  continue followup with him for all of his primary care issues, or set  himself up with a primary care doctor in [place].  In any event, the  results will go to his primary care physician for further followup.  He  also has symptoms suggestive of sleep apnea, but notes that he would  never wear the CPAP machine and is not interested in testing at this  time.  I have explained to him the importance of testing for and  treating sleep apnea, and if he ever changes his mind in the near  future, he should let us know.  I have set him up with followup with Dr.  Diona Browner in the next 4  months' time.      Tereso Newcomer, PA-C  Electronically Signed      Learta Codding, MD,FACC  Electronically Signed   SW/MedQ  DD: 04/07/2007  DT: 04/07/2007  Job #: [id number]

## 2011-02-10 NOTE — Assessment & Plan Note (Signed)
[facility]                          [facility]   [name] [name], [name] [name]                      MRN:          [id number]  DATE:09/03/2008                            DOB:          11-23-1955    PRIMARY CARE PHYSICIAN:  Sean A. Everardo All, MD   REASON FOR VISIT:  Routine followup.   HISTORY OF PRESENT ILLNESS:  [name] [name] comes in for an annual visit.  He  denies having any significant exertional angina or limiting  breathlessness.  His electrocardiogram today is normal showing sinus  rhythm at 69 beats per minute.  He continues on the medications outlined  below and had lipids obtained by Dr. Everardo All back in October  demonstrating good LDL control at 79, HDL of 41, total cholesterol of  146, and triglycerides of 96.  He states that he is due to have a  screening colonoscopy in the near future.  He does remain on aspirin and  Plavix with previous history of thrombectomy and drug-eluting stent  placement to the distal right coronary artery in October 2005.  Last  ischemic evaluation was in June 2008 showing no evidence of ischemia.  He is not reporting any wheezing or breathlessness beyond NYHA class II.  Today, we talked about smoking cessation and also trying to maintain a  more regular exercise regimen.  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   ALLERGIES:  No known drug allergies.   MEDICATIONS:  1. Metformin 500 mg p.o. b.i.d.  2. Plavix 75 mg p.o. daily.  3. Omega-3 supplements 1000 mg p.o. b.i.d.  4. Aspirin 81 mg p.o. daily.  5. Avalide 150/12.5 mg p.o. daily.  6. Simvastatin 80 mg p.o. nightly.  7. Actos 30 mg p.o. daily.  8. Sublingual nitroglycerin 0.4 mg p.r.n.   REVIEW OF SYSTEMS:  As described in the history of present illness.  No  claudication, palpitations, or syncope.  Otherwise, negative.   PHYSICAL EXAMINATION:  VITALS SIGNS:  Blood pressure is 121/73, heart  rate  is 75, weight is 244 pounds.  GENERAL:  This is an obese male in no acute distress.  NECK:  Examination of the neck reveals no elevated jugular venous  pressure.  No loud bruits.  No thyromegaly.  CHEST:  Lungs are clear without labored breathing at rest.  HEART:  Cardiac exam reveals a regular rate and rhythm.  No pericardial  rub or S3 gallop.  ABDOMEN:  Abdomen is soft and nontender.  No bruits.  EXTREMITIES:  Extremities exhibit no significant pitting edema.  Distal  pulses are 2+.   IMPRESSION AND RECOMMENDATIONS:  1. Coronary artery disease status post thrombectomy with drug-eluting      stent placement to the distal right coronary artery in October 2005      and associated with normal left ventricular systolic function.  Mr.      [name] is symptomatically stable and had a nonischemic Cardiolite in      2008.  We will continue medical therapy and annual followup unless  he develops symptoms in the interim.  I think he should be able to      temporarily discontinue Plavix for screening colonoscopy as needed.  2. Hyperlipidemia, well controlled on simvastatin and omega-3      supplements.     Jonelle Sidle, MD  Electronically Signed    SGM/MedQ  DD: 09/03/2008  DT: 09/03/2008  Job #: [id number]   cc:   Gregary Signs A. Everardo All, MD

## 2011-02-10 NOTE — Assessment & Plan Note (Signed)
[facility]                          [facility]   ADRIENE, PADULA                      MRN:          [id number]  DATE:05/09/2007                            DOB:          1956/05/01    CARDIOLOGIST:  Jonelle Sidle, MD   PRIMARY CARE PHYSICIAN:  Cleophas Dunker. Everardo All, MD   HISTORY OF PRESENT ILLNESS:  Mr. Marchitto is a 55 year old male patient  followed by Dr. Diona Browner with a history of coronary artery disease, who  I last saw on April 07, 2007.  Please see that note for complete details.  He had recently been evaluated with a stress Myoview test as well as an  echocardiogram, which both returned normal.  He complained of dyspnea on  exertion and we set him up for PFTs with DLCO.  PFTs came back and  revealed normal airway flow rates but moderately severe degree of  decrease in his DLCO at 50% of predicted.  The patient returns today for  follow-up.  He says he is actually feeling much better.  He denies any  more dyspnea on exertion.  He denies any chest pain.  He denies  orthopnea, PND or pedal edema.  He denies any syncope.   CURRENT MEDICATIONS:  1. Actos 45 mg a day.  2. Metformin 500 mg b.i.d.  3. Plavix 75 mg daily.  4. Fish oil 1 g b.i.d.  5. Lipitor 40 mg daily.  6. Aspirin 81 mg daily.  7. Avalide 12.5 mg daily.  8. Nitroglycerin p.r.n. chest pain.   ALLERGIES:  No known drug allergies.   SOCIAL HISTORY:  He continues to smoke a pack and a half of cigarettes  per day.   IMPRESSION:  1. Dyspnea on exertion, resolved.  2. Coronary artery disease.      a.     Status post thrombectomy/Cypher stenting to the distal right       coronary artery in October 2005.      b.     Recent negative Cardiolite study.  3. Good left ventricular function.  4. Hyperlipidemia.  5. Diabetes mellitus.  6. History of mild thrombocytopenia.  7. Continued tobacco abuse with recent abnormal pulmonary function      tests.   PLAN:  The  patient presents now today for follow-up.  As noted above,  his PFTs revealed significant reduction in the DLCO.  Otherwise his PFTs  were fairly normal.  Although the patient has symptomatically improved,  I think he needs further workup with pulmonology.  We will refer him to  Dr. Orson Aloe for further evaluation.  No medication change has been  made today, and he can follow up with Dr. Diona Browner in 6 months' time.  Again I reviewed the benefits of proceeding with testing for sleep apnea  and treatment of sleep apnea.  He continues to refuse.  I also stressed  to him the importance of quitting smoking again.      Tereso Newcomer, PA-C  Electronically Signed      Jonelle Sidle, MD  Electronically Signed   SW/MedQ  DD: 05/09/2007  DT: 05/10/2007  Job #: [id number]   cc:   Gregary Signs A. Everardo All, MD

## 2011-02-13 NOTE — Cardiovascular Report (Signed)
NAME:  TORRY, ISTRE               ACCOUNT NO.:  [id number]   MEDICAL RECORD NO.:  [id number]          PATIENT TYPE:  INP   LOCATION:  6529                         FACILITY:  [facility]   PHYSICIAN:  Learta Codding, M.D. LHCDATE OF BIRTH:  [birth year]   DATE OF PROCEDURE:  07/15/2004  DATE OF DISCHARGE:  07/16/2004                              CARDIAC CATHETERIZATION   REFERRING PHYSICIAN:  Dr. Romero Belling   CARDIOLOGIST:  Dr. Antoine Poche   PROCEDURES PERFORMED:  1.  Left heart catheterization with selective coronary angiography.  2.  Ventriculography.  3.  Single vessel coronary artery disease with a high grade stenosis of the      distal right coronary artery.   INDICATIONS:  Patient is a 55 year old male with history of substernal chest  pain.  The patient is admitted for catheterization to rule out underlying  coronary artery disease.   DESCRIPTION OF PROCEDURE:  After informed consent was obtained the patient  was brought to the catheterization laboratory.  The right groin was  sterilely prepped and draped.  A 6-French arterial sheath was placed in the  right femoral artery using the modified Seldinger technique.  Standard JL4  and JR4 catheters were used for coronary angiography.  A 6-French pigtail  catheter was used for ventriculography.  At termination of the procedure the  patient proceeded to have intervention done by Dr. Gerri Spore.   No complications were, however, encountered during the diagnostic  catheterization.   FINDINGS:   HEMODYNAMICS:  Left ventricular pressure 117/8 mmHg.  Aortic pressure 117/68  mmHg.   VENTRICULOGRAPHY:  Ejection fraction 60% with normal wall motion.  No  significant mitral regurgitation.   SELECTIVE CORONARY ANGIOGRAPHY:  Left main coronary artery was a large  caliber vessel.  No evidence of flow-limiting disease.   Left anterior descending artery and diagonal branch were free of flow-  limiting disease.   Circumflex coronary artery  demonstrated no evidence of flow-limiting  lesions.   Right coronary artery was a large caliber vessel with focal stenosis in the  distal aspect of the right coronary artery.  The lesion was approximately  80% after intracoronary nitroglycerin.   RECOMMENDATIONS:  Results of the catheterization were discussed with the  patient.  Dr. Gerri Spore plans to proceed to PCI to the right coronary  artery.      GED/MEDQ  D:  12/03/2004  T:  12/03/2004  Job:  [id number]   cc:   Rollene Rotunda, M.D.

## 2011-02-13 NOTE — Cardiovascular Report (Signed)
NAME:  [name] [name], [name] [name]               ACCOUNT NO.:  [id number]   MEDICAL RECORD NO.:  [id number]          PATIENT TYPE:  INP   LOCATION:  2017                         FACILITY:  [facility]   PHYSICIAN:  Carole Binning, M.D. LHCDATE OF BIRTH:  Dec 13, 1955   DATE OF PROCEDURE:  07/15/2004  DATE OF DISCHARGE:                              CARDIAC CATHETERIZATION   PROCEDURE PERFORMED:  Thrombectomy followed by percutaneous transluminal  coronary angioplasty with placement of a drug eluting stent of the distal  right coronary artery.   INDICATIONS FOR PROCEDURE:  [name] is a 55 year old male with recently  diagnosed diabetes.  He presented to the hospital with unstable angina.  Cardiac catheterization today by Dr. Andee Lineman revealed an 80% stenosis in the  distal right coronary artery.  We, therefore, opted to proceed with  percutaneous coronary intervention.   PROCEDURE NOTE:  We utilized the pre-existing 6 French sheath in the right  femoral artery.  Heparin and Integrillin were administered per protocol.  We  used a 6 [place] JR4 guiding catheter.  Our initial guide shots revealed the  presence of a small thrombus associated with his lesion.  It was not clear  whether this thrombus was related to the guide catheter.  Nevertheless, we  opted to proceed with thrombectomy prior to stent placement.  We advanced an  [employer]ahi soft coronary guide wire into the distal right coronary artery.  We  then performed thrombectomy with a catheter for a total of two passes, this  removed any residual evidence of thrombus.  We then positioned a 3.5 by 18  mm Cypher drug eluting stent across the lesion and deployed the stent at 17  atmospheres.  Following this, we positioned a 3.75 by  12 mm Quantum balloon  and inflated this to 18 atmospheres in the proximal aspect of the stent and  14 atmospheres in the mid aspect of the stent.  Intermittent doses of  intracoronary nitroglycerin were administered.  Final  angiographic images  were obtained revealing patency of the right coronary artery with 0% of  residual stenosis at the stent site and TIMI III flow.   COMPLICATIONS:  None.   RESULTS:  Successful thrombectomy followed by placement of a drug eluting  stent in the distal right coronary artery.  An 80% stenosis with thrombus  was reduced to 0% residual with TIMI III flow.   PLAN:  Integrillin will be continued overnight.  The patient will be treated  with Plavix for a minimum of six months and preferably 12 months.       MWP/MEDQ  D:  07/15/2004  T:  07/15/2004  Job:  [id number]   cc:   Gregary Signs A. Everardo All, M.D. [facility]   Rollene Rotunda, M.D.

## 2011-02-13 NOTE — Discharge Summary (Signed)
[name] [name], [name] [name]               ACCOUNT NO.:  [id number]   MEDICAL RECORD NO.:  [id number]          PATIENT TYPE:  INP   LOCATION:  6529                         FACILITY:  [facility]   PHYSICIAN:  Cornell Barman, P.A. LHCDATE OF BIRTH:  Sep 02, 1956   DATE OF ADMISSION:  07/14/2004  DATE OF DISCHARGE:  07/16/2004                                 DISCHARGE SUMMARY   DISCHARGE DIAGNOSES:  1.  Chest pain.  2.  Single vessel coronary artery disease.  3.  Status post PCI to the RCA.  4.  Hypertriglyceridemia.  5.  Tobacco abuse.   BRIEF ADMISSION HISTORY:  Mr. Frady is a 55 year old white male who  presented with two weeks of intermittent superior mid chest pain.  The pain  lasts 5-10 minutes.   PAST MEDICAL HISTORY:  1.  Osteoarthritis.  2.  Adult onset diabetes mellitus.  3.  Hypertension.  4.  Hypercholesterolemia.  5.  History of renal calculi.  6.  Testicular cancer.   HOSPITAL COURSE:  #1 - CARDIOVASCULAR:  The patient presented with chest  pain.  Serial cardiac enzymes were negative.  The patient was seen in  consultation by Cardiology.  The patient had an abnormal EKG and multiple  cardiac risk factors.  Therefore, they recommended presenting with cardiac  catheterization.  Cardiac catheterization was performed on July 15, 2004,  revealing a single vessel disease.  The patient underwent PCI to the RCA.  EF was preserved at 60%.  It was recommended the patient stay on Plavix for  at least 12 months.  Also, recommended managing as many risk factors as  possible including diabetes, hypertension, high cholesterol and tobacco  abuse.   #2 - ADULT ONSET DIABETES MELLITUS.  The patient's blood sugars were well  controlled.   #3 - HYPERCHOLESTEROLEMIA.  Fasting lipid profile did reveal that his  cholesterol was normal at 155.  However, triglycerides were elevated at 206,  HDL was low at 37.  Will have him continue his Lipitor for now.   #4 - PULMONARY.  The patient has ongoing  tobacco abuse and is currently not  interested in smoking cessation.   DISCHARGE LABORATORIES:  CBC was normal except for platelets of 109,000.  B-  MET was also normal.  TSH and D. dimer were all normal.  Chest x-ray was  negative.   DISCHARGE MEDICATIONS:  1.  Actos 45 mg daily.  2.  Glucophage XR 1 g b.i.d. to resume on July 18, 2004.  3.  Glipizide 10 mg b.i.d.  4.  Lipitor 20 mg daily.  5.  Avalide 150/12.5 mg daily.  6.  Protonix 40 mg daily.  7.  Plavix 75 mg daily.  8.  Aspirin 325 mg daily.   FOLLOWUP:  Follow up with Dr. Everardo All in 1-2 weeks.  Follow up with Roxanne Mins, P.A. for Dr. Andee Lineman on October 26 at 11:30 a.m.       LC/MEDQ  D:  07/16/2004  T:  07/16/2004  Job:  [id number]   cc:   Gregary Signs A. Everardo All, M.D. [facility]   Learta Codding, M.D. [facility]

## 2011-02-13 NOTE — H&P (Signed)
NAMEJAYZIAH, [name] [name]               ACCOUNT NO.:  [id number]   MEDICAL RECORD NO.:  [id number]          PATIENT TYPE:  INP   LOCATION:  2017                         FACILITY:  [facility]   PHYSICIAN:  Sean A. Everardo All, M.D. [facility] OF BIRTH:  [birth year]   DATE OF ADMISSION:  07/14/2004  DATE OF DISCHARGE:                                HISTORY & PHYSICAL   REASON FOR ADMISSION:  Chest pain.   HISTORY OF PRESENT ILLNESS:  The patient is a 55 year old man with 2 weeks  of intermittent, severe pain at the mid chest.  Each episode lasted about 5  or 10 minutes and he had a total of about 15 episodes.  No associated  symptoms and no particular context.   Regarding his diabetes, his glucoses are still in the high 100s but he  states he has been taking his medications intermittently.   Regarding his dyslipidemia, he states he is taking his Lipitor as  prescribed.   PAST MEDICAL HISTORY:  1.  Osteoarthritis.  2.  Testicular cancer in 1978.  3.  Hypertension.  4.  Urolithiasis.  5.  Cigarette smoker.   MEDICATIONS:  1.  Glipizide XR 10 mg twice daily.  2.  Actos 45 mg a day.  3.  Glucophage XR 1000 mg twice daily.  4.  Avalide 150/12.5 one daily.  5.  Lipitor 20 mg daily.   SOCIAL HISTORY:  The patient is married.  He is a Naval architect.   FAMILY HISTORY:  There is no heart disease in his immediate family except  his father had an MI in his 69s.   REVIEW OF SYSTEMS:  Denies the following:  Fever, weight change, syncope,  shortness of breath, nausea, vomiting, rectal bleeding, hematuria, urinary  incontinence, fecal incontinence, decreased urinary force, skin rash,  anxiety, and depression.   PHYSICAL EXAMINATION:  VITAL SIGNS:  Blood pressure 138/78, heart rate is  92, temperature is 98.0, the weight is 228.  GENERAL:  No distress.  SKIN:  Not diaphoretic.  HEENT:  Head is atraumatic, sclerae nonicteric.  Pharynx with no erythema.  NECK:  Supple.  CHEST:  Clear to auscultation.   Chest walls nontender.  CARDIOVASCULAR:  No JVD, no edema, regular rate and rhythm, no murmur.  Pedal pulses are intact and carotid arteries have no bruit.  ABDOMEN:  Soft, obese, nontender.  No hepatosplenomegaly, no mass.  GENITAL, RECTAL:  Not done at this time due to patient condition.  EXTREMITIES:  Mild osteoarthritic changes.  NEUROLOGIC:  Alert, well oriented, moves all fours.  Cranial nerves appear  to be intact and gait is observed in the office to be normal.   Electrocardiogram shows a question of ST segment depression in lead III and  aVF.   IMPRESSION:  1.  Chest pain in a patient with risk factors.  2.  Diabetes.  Therapy is limited by nonadherence with medications.  3.  Dyslipidemia.   PLAN:  1.  Admit to [facility].  2.  Consult cardiology.  3.  Symptomatic therapy.  4.  Check home glucoses on this regimen.  5.  Recheck lipids.  6.  Full Code per the patient.       SAE/MEDQ  D:  07/14/2004  T:  07/14/2004  Job:  [id number]

## 2011-02-13 NOTE — Consult Note (Signed)
NAMEGREGORY, BARRICK               ACCOUNT NO.:  [id number]   MEDICAL RECORD NO.:  [id number]          PATIENT TYPE:  INP   LOCATION:  2017                         FACILITY:  [facility]   PHYSICIAN:  Delton See, P.A. LHCDATE OF BIRTH:  02-08-1956   DATE OF CONSULTATION:  DATE OF DISCHARGE:                                   CONSULTATION   [facility] CONSULTATION:   DATE OF CONSULTATION:  July 14, 2004.   BRIEF HISTORY:  We were asked to see this 55 year old male admitted to [facility] today from Dr. George Hugh office for evaluation of chest pain.  The patient has no previous cardiac history.   The patient saw Dr. Everardo All today at the patient's request.  He has been  having intermittent chest pain for approximately 2 weeks.  He was started on  Metformin approximately 1 month ago.  The patient thought the pain may be  due to the medication that was recently started.  The pain is somewhat  atypical, comes on at rest, is in the center of his chest.  There is no  radiation.  There is some associated shortness of breath and weakness.  No  diaphoresis, no nausea.  He describes it as a squeezing pain, and balls up  his fist when he speaks of the pain.  It is a 7 on a 1 to 10 scale, lasts  approximately 2 to 5 minutes at a time.  He feels like he has had about 12  episodes since this started.  Nothing seems to make the pain better or  worse.  It often occurs after eating.   PAST MEDICAL HISTORY:  1.  Diabetes mellitus.  2.  Hypertension.  3.  Elevated cholesterol levels.  4.  Renal calculi.  5.  Tobacco history.  6.  Testicular cancer.  7.  Arthritis.   ALLERGIES:  No known drug allergies.   CURRENT MEDICATIONS:  1.  Actos 45 mg daily.  2.  Glucophage-XR 1000 mg b.i.d.  3.  Glipizide 10 mg b.i.d.  4.  Lipitor 20 mg daily.  5.  Avalide 150/12.5 daily.  6.  Protonix.   SOCIAL HISTORY:  The patient lives in [place].  He is married.  He has no  children.  He  smokes 1-1/2 packs of cigarettes per day and has done so for  37 years.  He drinks alcohol occasionally.  He works as a Oncologist.  He is very sedentary.   FAMILY HISTORY:  His mother is in her 6s.  Her health status is unknown.  His father is in his 10s.  He had an MI in his mid 53s.  He has 2 brothers  and 3 sisters, none of whom have coronary disease.   REVIEW OF SYSTEMS:  Negative except for as noted above as well as DJD of the  neck.   PHYSICAL EXAMINATION:  GENERAL:  A pleasant, 55 year old white male in no  acute distress.  VITAL SIGNS:  Blood pressure 138/78, pulse 92, weight 228 pounds,  temperature 98.  HEENT:  Unremarkable.  NECK:  No bruits, no jugular venous distention.  HEART:  Regular rate and rhythm without murmur.  LUNGS:  Decreased breath sounds.  ABDOMEN:  Obese, soft, nontender.  EXTREMITIES:  Pulses intact without edema.  SKIN:  Warm and dry.   LABORATORY DATA:  EKG shows sinus rhythm, rate 88 beats per minute with T-  wave abnormalities in 3 and AVF.  Other labs are pending.   IMPRESSION:  1.  Chest pain, somewhat atypical.  2.  Diabetes mellitus.  3.  History of elevated lipids.  4.  Mild obesity.  5.  Hypertension.  6.  Renal calculi.  7.  Testicular cancer.  8.  Degenerative joint disease.  9.  Ongoing tobacco use.   PLAN:  The patient was seen by Dr. Antoine Poche and myself.  Cardiac  catheterization has been recommended.  The risks and benefits have been  discussed.  The patient and his wife are in agreement with this plan.  They  are somewhat concerned about his ability to quit smoking, and they requested  a nicotine patch.  Dr. Antoine Poche would rather try anxiolytics first and then  nicotine patch if he still feels he needs further help.  Cardiac  catheterization will be planned for tomorrow in the afternoon.  He will be  given a clear liquid breakfast.   DICTATED FOR:  Rollene Rotunda, MD.       DR/MEDQ  D:  07/14/2004  T:   07/14/2004  Job:  [id number]   cc:   Gregary Signs A. Everardo All, M.D. [facility]

## 2011-02-13 NOTE — Assessment & Plan Note (Signed)
[facility]                            [facility] OFFICE NOTE   [name] [name], [name] [name]                      MRN:          [id number]  DATE:07/26/2006                            DOB:          August 08, 1956    REASON FOR VISIT:  Annual followup.   [name] [name] is a 55 year old male, with known coronary artery disease, notable  for prior CYPHER stenting of the distal RCA, following initial treatment  with PTCA and thrombectomy, in October 2005.   The patient has done quite well since then, and since last seen here in the  clinic in October of last year by [name] [name], has not had any interim  development of recurrent angina pectoris. 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 Unfortunately, he continues to smoke, but has no desire to  stop given that he enjoys it too much. However, he does report compliance  with his medications.   CURRENT MEDICATIONS:  1. Actos 45 daily.  2. Metformin 500 b.i.d.  3. Plavix.  4. Coated aspirin 325 daily.  5. Fish oil 1000 b.i.d.  6. Avapro 150 daily.  7. Lipitor 40 daily.   PHYSICAL EXAMINATION:  VITAL SIGNS:  Blood pressure 114/72, pulse 80,  regular, weight 244.  GENERAL:  A 55 year old male sitting upright in no apparent distress.  NECK:  Palpable carotid pulses without bruits.  LUNGS:  Clear to auscultation in [name] fields.  HEART:  Regular rate and rhythm (S1, S2). No significant murmurs.  ABDOMEN:  Soft, nontender, with intact bowel sounds.  EXTREMITIES:  Palpable pulses with trace edema.  NEUROLOGIC:  No focal deficit.   IMPRESSION:  1. Single-vessel coronary artery disease.      a.     Status post thrombectomy/CYPHER stenting distal right coronary       artery October 2005.      b.     Normal left ventricular function.  2. Ongoing tobacco.  3. Hyperlipidemia.      a.     Followup by [name] [name].  4. Type 2 diabetes mellitus.  5. History of mild thrombocytopenia.      a.      Tolerating full dose aspirin/Plavix.   PLAN:  1. Continue Plavix indefinitely, and decrease aspirin to 81 mg daily.  2. Defer aggressive lipid management to [name] [name] with target LDL      goal of 70 or less, particularly in light of patient's known diabetes      mellitus.  3. Patient strongly encouraged to initiate a walking program, or some form      of regular exercise program, in order to lose weight and improve his      overall health profile. Also strongly advised to stop smoking, although      he made it quite clear that he is not quite yet ready to do so.  4. Schedule return clinic followup in one year with [name] [name], who      performed the initial diagnostic cardiac catheterization in 2005.     ______________________________  [name] Searing, PA-C  ______________________________  [name] Sidle, MD   GS/MedQ  DD: 07/26/2006  DT: 07/27/2006  Job #: [id number]   cc:   [name] Signs A. Everardo All, MD

## 2011-02-14 ENCOUNTER — Other Ambulatory Visit: Payer: Self-pay | Admitting: Cardiology

## 2011-02-16 NOTE — Telephone Encounter (Signed)
[place] pt. 

## 2011-02-24 ENCOUNTER — Telehealth: Payer: Self-pay

## 2011-02-24 NOTE — Telephone Encounter (Signed)
Pt's spouse called stating that pt is experiencing severe muscle cramps with Welchol. Pt has stopped medication since Sunday and is requesting alternate medication, please advise

## 2011-02-24 NOTE — Telephone Encounter (Signed)
F/u ov is due.  Let's address then.  You can stop welchol for now.

## 2011-02-25 NOTE — Telephone Encounter (Signed)
Last OV 02/06/2011, advised to follow up in 3 month. Does pt still need follow up appt now?

## 2011-02-25 NOTE — Telephone Encounter (Signed)
Pt advised.

## 2011-02-25 NOTE — Telephone Encounter (Signed)
Thank you. Stop welchol for now. We'll recheck at next ov

## 2011-03-13 ENCOUNTER — Other Ambulatory Visit: Payer: Self-pay | Admitting: *Deleted

## 2011-03-13 ENCOUNTER — Telehealth: Payer: Self-pay | Admitting: *Deleted

## 2011-03-13 MED ORDER — ASPIRIN-DIPYRIDAMOLE ER 25-200 MG PO CP12: 1.0000 | ORAL_CAPSULE | Freq: Two times a day (BID) | ORAL | Status: DC

## 2011-03-13 NOTE — Telephone Encounter (Signed)
Pt needs rx for Aggrenox sent in to [facility] in [place] and needs 90 day supply sent to [facility]-pt is almost out of medications

## 2011-03-13 NOTE — Telephone Encounter (Signed)
Pt's wife left message on voicemail asking for Dr. Diona Browner to refill the pt's Aggrenox. She requested in the message that [facility] and [facility] to [facility].  Spoke with pt's wife who states the doctor in the hospital (Dr. Doyne Keel) started him on this about 1 yr ago when he was admitted for a stroke. He does not see neurology or a specialist for the stroke. Pt's wife notified that primary MD should prescribe the Aggrenox for the patient. Pt's wife asked if we would have samples he could have. Notified pt's wife that we do not have samples of this medication as we do not generally prescribe it. Pt's wife will contact primary MD.

## 2011-03-14 ENCOUNTER — Other Ambulatory Visit: Payer: Self-pay | Admitting: Endocrinology

## 2011-04-27 ENCOUNTER — Other Ambulatory Visit: Payer: Self-pay | Admitting: Endocrinology

## 2011-04-30 ENCOUNTER — Ambulatory Visit: Payer: BC Managed Care – PPO

## 2011-04-30 DIAGNOSIS — E119 Type 2 diabetes mellitus without complications: Secondary | ICD-10-CM

## 2011-04-30 LAB — HEMOGLOBIN A1C: Hgb A1c MFr Bld: 7.8 % — ABNORMAL HIGH (ref 4.6–6.5)

## 2011-05-04 ENCOUNTER — Other Ambulatory Visit: Payer: BC Managed Care – PPO

## 2011-05-08 ENCOUNTER — Encounter: Payer: Self-pay | Admitting: Endocrinology

## 2011-05-08 ENCOUNTER — Ambulatory Visit (INDEPENDENT_AMBULATORY_CARE_PROVIDER_SITE_OTHER): Payer: BC Managed Care – PPO | Admitting: Endocrinology

## 2011-05-08 DIAGNOSIS — E119 Type 2 diabetes mellitus without complications: Secondary | ICD-10-CM

## 2011-05-08 DIAGNOSIS — R5383 Other fatigue: Secondary | ICD-10-CM

## 2011-05-08 DIAGNOSIS — R5381 Other malaise: Secondary | ICD-10-CM

## 2011-05-08 NOTE — Progress Notes (Signed)
Subjective:    Patient ID: [name] [name], male    DOB: 10/18/1955, 55 y.o.   MRN: [id number]  HPI Pt says he got abd pain with welchol.   He is discouraged about ongoing weight gain.  He is concerned that his dm will not be controlled on orals, because he needs to keep his cdl. He resumed the actos to improve dm control, but edema is less this time.    Past Medical History  Diagnosis Date  . Coronary atherosclerosis of native coronary artery     DES RCA 10/05  . Stroke     Left pons 6/11 - Aggrenox started  . Mixed hyperlipidemia   . Type 2 diabetes mellitus   . Cholelithiasis   . Testicular cancer 1978    Past Surgical History  Procedure Date  . Testicular tumor resection     Right side    History   Social History  . Marital Status: Married    Spouse Name: N/A    Number of Children: 0  . Years of Education: N/A   Occupational History  . Truck driver    Social History Main Topics  . Smoking status: Former Smoker -- 1.5 packs/day for 43 years    Types: Cigarettes    Quit date: 04/21/2010  . Smokeless tobacco: Never Used  . Alcohol Use: No  . Drug Use: No  . Sexually Active: Not on file   Other Topics Concern  . Not on file   Social History Narrative  . No narrative on file    Current Outpatient Prescriptions on File Prior to Visit  Medication Sig Dispense Refill  . ACTOS 30 MG tablet TAKE 1 TABLET DAILY  90 tablet  1  . celecoxib (CELEBREX) 200 MG capsule Take 200 mg by mouth daily as needed.       . dipyridamole-aspirin (AGGRENOX) 25-200 MG per 12 hr capsule Take 1 capsule by mouth 2 (two) times daily.  60 capsule  1  . fish oil-omega-3 fatty acids 1000 MG capsule Take 1 capsule by mouth 2 (two) times daily.       . metFORMIN (GLUCOPHAGE) 500 MG tablet TAKE 1 TABLET TWICE A DAY  180 tablet  2  . nitroGLYCERIN (NITROSTAT) 0.4 MG SL tablet Place 0.4 mg under the tongue every 5 (five) minutes as needed. May repeat every 5 minutes for up to 3 doses       .  simvastatin (ZOCOR) 80 MG tablet TAKE 1 TABLET AT BEDTIME  90 tablet  2  . sitaGLIPtan (JANUVIA) 100 MG tablet Take 1 tablet (100 mg total) by mouth daily.  90 tablet  3  . colesevelam (WELCHOL) 625 MG tablet Take 3 tablets (1,875 mg total) by mouth 2 (two) times daily with a meal.  540 tablet  3   No Known Allergies  Family History  Problem Relation Age of Onset  . Diabetes type II Father    BP 138/70  Pulse 70  Temp(Src) 98.7 F (37.1 C) (Oral)  Ht 6' (1.829 m)  Wt 293 lb (132.904 kg)  BMI 39.74 kg/m2  SpO2 97%  Review of Systems He reports weight gain.  He attributes doe to obesity.      Objective:   Physical Exam Pulses: dorsalis pedis intact bilat.   Feet: no deformity.  no ulcer on the feet.  feet are of normal color and temp.  Trace bilat leg edema Neuro: sensation is intact to touch on the feet. Lab Results  Component Value Date   HGBA1C 7.8* 04/30/2011      Assessment & Plan:  Dm, Needs increased rx, if it can be done with a regimen that avoids or minimizes hypoglycemia. Obesity:  This will need effective rx if he is to keep dm well-controlled without the need for insulin (truck driver) sbd pain, prob due to welchol.  He should try a lower dosage.

## 2011-05-08 NOTE — Patient Instructions (Addendum)
Please make a regular physical appointment in 3 months.  Reduce welchol to 3x625 mg daily (with any meal, but not along with other medications).

## 2011-06-03 ENCOUNTER — Encounter: Payer: Self-pay | Admitting: Endocrinology

## 2011-06-29 ENCOUNTER — Telehealth: Payer: Self-pay | Admitting: *Deleted

## 2011-06-29 NOTE — Telephone Encounter (Signed)
Wife left VM - Pt has been taking welchol and c/o breast tenderness. He wants to know if this is something to worry about? Or is this possible side effect of medication?

## 2011-06-29 NOTE — Telephone Encounter (Signed)
Pt's spouse informed, states that they will callback if they decide to move up appointment.

## 2011-06-29 NOTE — Telephone Encounter (Signed)
Very unlikely to be the medication.  We can check at next ov, which you can move-up if you wish

## 2011-06-29 NOTE — Telephone Encounter (Signed)
Left message for pt or spouse to callback office.

## 2011-07-03 ENCOUNTER — Telehealth: Payer: Self-pay | Admitting: Cardiology

## 2011-07-03 NOTE — Telephone Encounter (Signed)
[name] [name] called in regards to getting reminder for a 6 month fu for her husband. They feel he is doing ok now and Was under the impression it would be a year follow up. Explained to [name] [name] it would be next week before we  Returned her telephone call.

## 2011-07-07 ENCOUNTER — Encounter: Payer: Self-pay | Admitting: Endocrinology

## 2011-07-07 ENCOUNTER — Ambulatory Visit (INDEPENDENT_AMBULATORY_CARE_PROVIDER_SITE_OTHER): Payer: BC Managed Care – PPO | Admitting: Endocrinology

## 2011-07-07 VITALS — BP 138/88 | HR 96 | Temp 97.7°F | Ht 73.0 in | Wt 284.0 lb

## 2011-07-07 DIAGNOSIS — Z79899 Other long term (current) drug therapy: Secondary | ICD-10-CM | POA: Insufficient documentation

## 2011-07-07 DIAGNOSIS — M5416 Radiculopathy, lumbar region: Secondary | ICD-10-CM | POA: Insufficient documentation

## 2011-07-07 DIAGNOSIS — IMO0002 Reserved for concepts with insufficient information to code with codable children: Secondary | ICD-10-CM

## 2011-07-07 MED ORDER — OXYCODONE-ACETAMINOPHEN 10-325 MG PO TABS: 1.0000 | ORAL_TABLET | ORAL | Status: DC | PRN

## 2011-07-07 NOTE — Telephone Encounter (Signed)
Left message to call back on voice mail

## 2011-07-07 NOTE — Patient Instructions (Addendum)
Here is a prescription for a stronger pain medication. Let's check the mri of the spine.  you will receive a phone call, about a day and time for an appointment.

## 2011-07-07 NOTE — Progress Notes (Signed)
Subjective:    Patient ID: [name] [name], male    DOB: 1956-07-31, 55 y.o.   MRN: [id number]  HPI 10 days of severe pain at the lower back, which happened in the context of heavy lifting.  There is assoc radiation to the left hip and anterolat thigh. No help with advil.  Pain is not improving.   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 Past Medical History  Diagnosis Date  . Coronary atherosclerosis of native coronary artery     DES RCA 10/05  . Stroke     Left pons 6/11 - Aggrenox started  . Mixed hyperlipidemia   . Type 2 diabetes mellitus   . Cholelithiasis   . Testicular cancer 1978    Past Surgical History  Procedure Date  . Testicular tumor resection     Right side    History   Social History  . Marital Status: Married    Spouse Name: N/A    Number of Children: 0  . Years of Education: N/A   Occupational History  . Truck driver    Social History Main Topics  . Smoking status: Former Smoker -- 1.5 packs/day for 43 years    Types: Cigarettes    Quit date: 04/21/2010  . Smokeless tobacco: Never Used  . Alcohol Use: No  . Drug Use: No  . Sexually Active: Not on file   Other Topics Concern  . Not on file   Social History Narrative  . No narrative on file    Current Outpatient Prescriptions on File Prior to Visit  Medication Sig Dispense Refill  . ACTOS 30 MG tablet TAKE 1 TABLET DAILY  90 tablet  1  . celecoxib (CELEBREX) 200 MG capsule Take 200 mg by mouth daily as needed.       . colesevelam (WELCHOL) 625 MG tablet Take 1,875 mg by mouth daily.        Marland Kitchen dipyridamole-aspirin (AGGRENOX) 25-200 MG per 12 hr capsule Take 1 capsule by mouth 2 (two) times daily.  60 capsule  1  . fish oil-omega-3 fatty acids 1000 MG capsule Take 1 capsule by mouth 2 (two) times daily.       . metFORMIN (GLUCOPHAGE) 500 MG tablet TAKE 1 TABLET TWICE A DAY  180 tablet  2  . nitroGLYCERIN (NITROSTAT) 0.4 MG SL tablet Place 0.4 mg under the tongue every 5 (five) minutes as needed. May  repeat every 5 minutes for up to 3 doses       . simvastatin (ZOCOR) 80 MG tablet TAKE 1 TABLET AT BEDTIME  90 tablet  2  . sitaGLIPtan (JANUVIA) 100 MG tablet Take 1 tablet (100 mg total) by mouth daily.  90 tablet  3    No Known Allergies  Family History  Problem Relation Age of Onset  . Diabetes type II Father     BP 138/88  Pulse 96  Temp(Src) 97.7 F (36.5 C) (Oral)  Ht 6\' 1"  (1.854 m)  Wt 284 lb (128.822 kg)  BMI 37.47 kg/m2  SpO2 97%  Review of Systems There is slight numbness of the left anterolat thigh. No bowel or bladder retention.      Objective:   Physical Exam VITAL SIGNS:  See vs page GENERAL: no distress Spine: nontender Neuro: sensation is intact to touch on the left thigh MUSCULOSKELETAL: normal strength throughout the left lower extremity.   Gait: slightly favors left leg.        Assessment & Plan:  Left leg radiculopathy, prob L4, new Dm.  This is a relative contraindication to steroids. Htn, prob exac by pain

## 2011-07-08 NOTE — Telephone Encounter (Signed)
Pt's wife notified last office note indicates 6 month f/u. Pt's wife verbalized understanding. She states she will notify her husband and have him call to schedule.

## 2011-07-15 ENCOUNTER — Telehealth: Payer: Self-pay | Admitting: *Deleted

## 2011-07-15 ENCOUNTER — Ambulatory Visit (HOSPITAL_COMMUNITY)
Admission: RE | Admit: 2011-07-15 | Discharge: 2011-07-15 | Disposition: A | Discharge status: Home / Self Care | Payer: BC Managed Care – PPO | Source: Ambulatory Visit | Attending: Endocrinology | Admitting: Endocrinology

## 2011-07-15 DIAGNOSIS — M5416 Radiculopathy, lumbar region: Secondary | ICD-10-CM

## 2011-07-15 DIAGNOSIS — IMO0002 Reserved for concepts with insufficient information to code with codable children: Secondary | ICD-10-CM | POA: Insufficient documentation

## 2011-07-15 LAB — CREATININE, SERUM
GFR calc Af Amer: >90 mL/min (ref >90)
GFR calc non Af Amer: 90 mL/min — ABNORMAL LOW (ref >90)

## 2011-07-15 NOTE — Telephone Encounter (Signed)
Done

## 2011-07-15 NOTE — Telephone Encounter (Signed)
Pt is requesting order for Open MRI at [facility] had appt for MRI today but was unable to tolerate it.

## 2011-07-15 NOTE — Telephone Encounter (Signed)
Pt informed of order via VM, left message for pt to callback office with any questions/concerns.

## 2011-07-20 ENCOUNTER — Telehealth: Payer: Self-pay

## 2011-07-20 ENCOUNTER — Other Ambulatory Visit (HOSPITAL_COMMUNITY): Payer: BC Managed Care – PPO

## 2011-07-20 MED ORDER — TRIAZOLAM 0.25 MG PO TABS: 0.2500 mg | ORAL_TABLET | Freq: Once | ORAL | Status: DC

## 2011-07-20 NOTE — Telephone Encounter (Signed)
Pt's spouse called requesting Rx for Valium for pt to take prior to MRI, please advise.

## 2011-07-20 NOTE — Telephone Encounter (Signed)
i printed 

## 2011-07-20 NOTE — Telephone Encounter (Signed)
Rx faxed to [facility] in [place], pt's spouse informed.

## 2011-07-22 ENCOUNTER — Ambulatory Visit
Admission: RE | Admit: 2011-07-22 | Discharge: 2011-07-22 | Disposition: A | Discharge status: Home / Self Care | Payer: BC Managed Care – PPO | Source: Ambulatory Visit | Attending: Endocrinology | Admitting: Endocrinology

## 2011-07-22 DIAGNOSIS — M5416 Radiculopathy, lumbar region: Secondary | ICD-10-CM

## 2011-07-22 MED ORDER — GADOBENATE DIMEGLUMINE 529 MG/ML IV SOLN
20.0000 mL | Freq: Once | INTRAVENOUS | Status: AC | PRN
  Administered 2011-07-22: 20 mL via INTRAVENOUS

## 2011-07-24 ENCOUNTER — Telehealth: Payer: Self-pay | Admitting: *Deleted

## 2011-07-24 MED ORDER — OXYCODONE-ACETAMINOPHEN 10-325 MG PO TABS: 1.0000 | ORAL_TABLET | ORAL | Status: AC | PRN

## 2011-07-24 NOTE — Telephone Encounter (Signed)
i printed 

## 2011-07-24 NOTE — Telephone Encounter (Signed)
Pt's spouse called on behalf of pt, pt would like referral to pain management specialist. Also pt is still in a lot of pain and is requesting refill of pain medication given previously until he can get appointment with specialist-please advise

## 2011-07-24 NOTE — Telephone Encounter (Signed)
Pt's spouse informed, rx placed upfront in cabinet ready for pickup.

## 2011-08-03 ENCOUNTER — Other Ambulatory Visit (INDEPENDENT_AMBULATORY_CARE_PROVIDER_SITE_OTHER): Payer: BC Managed Care – PPO

## 2011-08-03 DIAGNOSIS — Z79899 Other long term (current) drug therapy: Secondary | ICD-10-CM

## 2011-08-03 DIAGNOSIS — R5383 Other fatigue: Secondary | ICD-10-CM

## 2011-08-03 DIAGNOSIS — E119 Type 2 diabetes mellitus without complications: Secondary | ICD-10-CM

## 2011-08-03 DIAGNOSIS — R5381 Other malaise: Secondary | ICD-10-CM

## 2011-08-03 LAB — BASIC METABOLIC PANEL
BUN: 20 mg/dL (ref 6–23)
Chloride: 105 mEq/L (ref 96–112)
GFR: 72.54 mL/min (ref >60.00)
Potassium: 4.5 mEq/L (ref 3.5–5.1)
Sodium: 139 mEq/L (ref 135–145)

## 2011-08-03 LAB — HEMOGLOBIN A1C: Hgb A1c MFr Bld: 7.3 % — ABNORMAL HIGH (ref 4.6–6.5)

## 2011-08-04 LAB — TESTOSTERONE: Testosterone: 181.4 ng/dL — ABNORMAL LOW (ref 350.00–890.00)

## 2011-08-07 ENCOUNTER — Other Ambulatory Visit (INDEPENDENT_AMBULATORY_CARE_PROVIDER_SITE_OTHER): Payer: BC Managed Care – PPO

## 2011-08-07 ENCOUNTER — Encounter: Payer: Self-pay | Admitting: Endocrinology

## 2011-08-07 ENCOUNTER — Ambulatory Visit (INDEPENDENT_AMBULATORY_CARE_PROVIDER_SITE_OTHER): Payer: BC Managed Care – PPO | Admitting: Endocrinology

## 2011-08-07 VITALS — BP 124/72 | HR 92 | Temp 98.2°F | Ht 73.0 in | Wt 285.0 lb

## 2011-08-07 DIAGNOSIS — M5416 Radiculopathy, lumbar region: Secondary | ICD-10-CM

## 2011-08-07 DIAGNOSIS — N62 Hypertrophy of breast: Secondary | ICD-10-CM

## 2011-08-07 DIAGNOSIS — Z0000 Encounter for general adult medical examination without abnormal findings: Secondary | ICD-10-CM

## 2011-08-07 DIAGNOSIS — E291 Testicular hypofunction: Secondary | ICD-10-CM

## 2011-08-07 DIAGNOSIS — Z8547 Personal history of malignant neoplasm of testis: Secondary | ICD-10-CM

## 2011-08-07 LAB — LUTEINIZING HORMONE: LH: 20.64 m[IU]/mL — ABNORMAL HIGH (ref 1.50–9.30)

## 2011-08-07 NOTE — Assessment & Plan Note (Signed)
Uncertain type.  Had surgery and chemotx in 1970's

## 2011-08-07 NOTE — Patient Instructions (Addendum)
Refer to pmr specialist.  you will receive a phone call, about a day and time for an appointment. please consider these measures for your health:  minimize alcohol.  do not use tobacco products.  have a colonoscopy at least every 10 years from age 55.  Women should have an annual mammogram from age 69.  keep firearms safely stored.  always use seat belts.  have working smoke alarms in your home.  see an eye doctor and dentist regularly.  never drive under the influence of alcohol or drugs (including prescription drugs).  those with fair skin should take precautions against the sun. please let me know what your wishes would be, if artificial life support measures should become necessary.  it is critically important to prevent falling down (keep floor areas well-lit, dry, and free of loose objects.  If you have a cane, walker, or wheelchair, you should use it, even for short trips around the house.  Also, try not to rush) blood tests are being requested for you today.  please call [phone] to hear your test results.  You will be prompted to enter the 9-digit "MRN" number that appears at the top left of this page, followed by #.  Then you will hear the message.   Please come back for a follow-up appointment in 3 months. Continue your dietary efforts. (update: we discussed code status.  pt requests full code, but would not want to be started or maintained on artificial life-support measures if there was not a reasonable chance of recovery) (update: i left message on phone-tree: i advised androgel).

## 2011-08-07 NOTE — Progress Notes (Signed)
Subjective:    Patient ID: [name] [name], male    DOB: 09-03-1956, 55 y.o.   MRN: [id number]  HPI here for regular wellness examination.  He's feeling pretty well in general, and says chronic med probs are stable, except as noted below Low-back pain is improved but not resolved. Past Medical History  Diagnosis Date  . Coronary atherosclerosis of native coronary artery     DES RCA 10/05  . Stroke     Left pons 6/11 - Aggrenox started  . Mixed hyperlipidemia   . Type 2 diabetes mellitus   . Cholelithiasis   . Testicular cancer 1978    Past Surgical History  Procedure Date  . Testicular tumor resection     Right side    History   Social History  . Marital Status: Married    Spouse Name: N/A    Number of Children: 0  . Years of Education: N/A   Occupational History  . Truck driver    Social History Main Topics  . Smoking status: Former Smoker -- 1.5 packs/day for 43 years    Types: Cigarettes    Quit date: 04/21/2010  . Smokeless tobacco: Never Used  . Alcohol Use: No  . Drug Use: No  . Sexually Active: Not on file   Other Topics Concern  . Not on file   Social History Narrative  . No narrative on file    Current Outpatient Prescriptions on File Prior to Visit  Medication Sig Dispense Refill  . ACTOS 30 MG tablet TAKE 1 TABLET DAILY  90 tablet  1  . celecoxib (CELEBREX) 200 MG capsule Take 200 mg by mouth daily as needed.       . colesevelam (WELCHOL) 625 MG tablet Take 1,875 mg by mouth daily.        Marland Kitchen dipyridamole-aspirin (AGGRENOX) 25-200 MG per 12 hr capsule Take 1 capsule by mouth 2 (two) times daily.  60 capsule  1  . fish oil-omega-3 fatty acids 1000 MG capsule Take 1 capsule by mouth 2 (two) times daily.       . metFORMIN (GLUCOPHAGE) 500 MG tablet TAKE 1 TABLET TWICE A DAY  180 tablet  2  . nitroGLYCERIN (NITROSTAT) 0.4 MG SL tablet Place 0.4 mg under the tongue every 5 (five) minutes as needed. May repeat every 5 minutes for up to 3 doses       .  simvastatin (ZOCOR) 80 MG tablet TAKE 1 TABLET AT BEDTIME  90 tablet  2  . sitaGLIPtan (JANUVIA) 100 MG tablet Take 1 tablet (100 mg total) by mouth daily.  90 tablet  3    No Known Allergies  Family History  Problem Relation Age of Onset  . Diabetes type II Father     BP 124/72  Pulse 92  Temp(Src) 98.2 F (36.8 C) (Oral)  Ht 6\' 1"  (1.854 m)  Wt 285 lb (129.275 kg)  BMI 37.60 kg/m2  SpO2 96%    Review of Systems  Constitutional: Negative for fever.       He has lost weight, due to his efforts  HENT: Negative for hearing loss.   Eyes: Negative for visual disturbance.  Respiratory:       Slight doe  Cardiovascular: Negative for chest pain.  Gastrointestinal: Negative for anal bleeding.  Genitourinary: Negative for hematuria.  Musculoskeletal: Negative for gait problem.  Skin: Negative for rash.  Neurological: Negative for syncope and numbness.  Hematological: Does not bruise/bleed easily.  Psychiatric/Behavioral: Negative for  dysphoric mood.      Objective:   Physical Exam VS: see vs page GEN: no distress HEAD: head: no deformity eyes: no periorbital swelling, no proptosis external nose and ears are normal mouth: no lesion seen NECK: supple, thyroid is not enlarged CHEST WALL: no deformity LUNGS: clear to auscultation CV: reg rate and rhythm, no murmur ABD: abdomen is soft, nontender.  no hepatosplenomegaly.  not distended.  no hernia RECTAL: normal external and internal exam.  heme neg. PROSTATE:  Normal size.  No nodule MUSCULOSKELETAL: muscle bulk and strength are grossly normal.  no obvious joint swelling.  gait is normal and steady EXTEMITIES: no deformity.  no ulcer on the feet.  feet are of normal color and temp.  no edema PULSES: dorsalis pedis intact bilat.  no carotid bruit NEURO:  cn 2-12 grossly intact.   readily moves all 4's.  sensation is intact to touch on the feet SKIN:  Normal texture and temperature.  No rash or suspicious lesion is visible.    NODES:  None palpable at the neck PSYCH: alert, oriented x3.  Does not appear anxious nor depressed.     Assessment & Plan:  Wellness visit today, with problems stable, except as noted.    SEPARATE EVALUATION FOLLOWS--EACH PROBLEM HERE IS NEW, NOT RESPONDING TO TREATMENT, OR POSES SIGNIFICANT RISK TO THE PATIENT'S HEALTH: HISTORY OF THE PRESENT ILLNESS: Pt states 6 mos of slight pain at the breast areas, and assoc swelling.   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  LAB/XRAY RESULTS: Lab Results  Component Value Date   TESTOSTERONE 181.40* 08/03/2011  FSH and LH are elveated IMPRESSION: Hypogonadism, primary.  Due to orchiectomy and chemotx. Gynecomastia, mild, prob due to hypogonadism PLAN: See instruction page

## 2011-08-14 ENCOUNTER — Telehealth: Payer: Self-pay

## 2011-08-14 MED ORDER — TESTOSTERONE 2.5 MG/24HR TD PT24: 1.0000 | MEDICATED_PATCH | Freq: Every day | TRANSDERMAL | Status: DC

## 2011-08-14 NOTE — Telephone Encounter (Signed)
i printed rx 

## 2011-08-14 NOTE — Telephone Encounter (Signed)
Pt's wife states they did not hear another message regarding other lab results besides testosterone and is asking for results of labs

## 2011-08-14 NOTE — Telephone Encounter (Signed)
Pt's spouse called stating that pt would like to start hormone replacement in patch form. Please contact spouse in regard to Rx

## 2011-08-16 NOTE — Telephone Encounter (Signed)
The blood test results said the low testosterone was probably from your cancer treatment many years ago.  testosterone would help

## 2011-08-17 NOTE — Telephone Encounter (Signed)
Pt's spouse is requesting the results of the other labs (LH, FSH, and Prolactin).

## 2011-08-17 NOTE — Telephone Encounter (Signed)
Left message for pt's spouse to callback office.  

## 2011-08-17 NOTE — Telephone Encounter (Signed)
Prolactin was normal, so this is not the cause of the low testosterone.  FSH and LH are high, just ike a menopausal woman.

## 2011-08-18 NOTE — Telephone Encounter (Signed)
Pt informed of lab results. 

## 2011-08-26 LAB — ESTRADIOL, FREE: Estradiol: 36 pg/mL

## 2011-08-31 ENCOUNTER — Other Ambulatory Visit: Payer: Self-pay | Admitting: *Deleted

## 2011-08-31 MED ORDER — NITROGLYCERIN 0.4 MG SL SUBL: 0.4000 mg | SUBLINGUAL_TABLET | SUBLINGUAL | Status: DC | PRN

## 2011-09-10 ENCOUNTER — Other Ambulatory Visit: Payer: Self-pay | Admitting: Endocrinology

## 2011-09-15 ENCOUNTER — Ambulatory Visit: Payer: BC Managed Care – PPO | Admitting: Physical Medicine & Rehabilitation

## 2011-10-12 ENCOUNTER — Other Ambulatory Visit: Payer: Self-pay | Admitting: Endocrinology

## 2011-10-12 MED ORDER — TESTOSTERONE 2.5 MG/24HR TD PT24: 1.0000 | MEDICATED_PATCH | Freq: Every day | TRANSDERMAL | Status: DC

## 2011-10-12 NOTE — Telephone Encounter (Signed)
Pt's spouse requesting refill of Androderm patch be sent to [facility]-please advise

## 2011-10-12 NOTE — Telephone Encounter (Signed)
Rx faxed to [facility], pt's spouse informed.

## 2011-10-12 NOTE — Telephone Encounter (Signed)
Per wife need hormone patch--request to to [facility]mail order if this will be an ongoing prescription--

## 2011-10-12 NOTE — Telephone Encounter (Signed)
i printed refill 

## 2011-10-15 ENCOUNTER — Other Ambulatory Visit: Payer: Self-pay | Admitting: Endocrinology

## 2011-10-15 MED ORDER — TESTOSTERONE 2 MG/24HR TD PT24: 1.0000 | MEDICATED_PATCH | Freq: Every day | TRANSDERMAL | Status: DC

## 2011-10-19 ENCOUNTER — Telehealth: Payer: Self-pay

## 2011-10-19 NOTE — Telephone Encounter (Signed)
A user error has taken place: encounter opened in error, closed for administrative reasons.

## 2011-10-20 ENCOUNTER — Other Ambulatory Visit: Payer: Self-pay | Admitting: Endocrinology

## 2011-10-20 MED ORDER — TESTOSTERONE 2 MG/24HR TD PT24: 1.0000 | MEDICATED_PATCH | Freq: Every day | TRANSDERMAL | Status: DC

## 2011-11-06 ENCOUNTER — Other Ambulatory Visit: Payer: Self-pay | Admitting: *Deleted

## 2011-11-06 MED ORDER — SIMVASTATIN 80 MG PO TABS: 80.0000 mg | ORAL_TABLET | Freq: Every day | ORAL | Status: DC

## 2011-11-23 ENCOUNTER — Other Ambulatory Visit: Payer: Self-pay | Admitting: Endocrinology

## 2011-12-11 ENCOUNTER — Telehealth: Payer: Self-pay | Admitting: *Deleted

## 2011-12-11 DIAGNOSIS — Z0389 Encounter for observation for other suspected diseases and conditions ruled out: Secondary | ICD-10-CM

## 2011-12-11 DIAGNOSIS — Z0000 Encounter for general adult medical examination without abnormal findings: Secondary | ICD-10-CM

## 2011-12-11 DIAGNOSIS — E119 Type 2 diabetes mellitus without complications: Secondary | ICD-10-CM

## 2011-12-11 DIAGNOSIS — E291 Testicular hypofunction: Secondary | ICD-10-CM

## 2011-12-11 NOTE — Telephone Encounter (Signed)
CPX labs entered into Epic for upcoming appointment. Do you want to recheck pt's testosterone when he come in for CPX? (Wife asked)

## 2011-12-11 NOTE — Telephone Encounter (Signed)
i ordered

## 2011-12-11 NOTE — Telephone Encounter (Signed)
Message copied by Carin Primrose on Fri Dec 11, 2011  4:33 PM ------      Message from: Newell Coral      Created: Wed Dec 02, 2011  2:07 PM      Regarding: cpe sch, needs labs ordered        cpe scheduled for the end of the month, they are hoping to come in before for labs. Thanks!

## 2011-12-16 ENCOUNTER — Other Ambulatory Visit (INDEPENDENT_AMBULATORY_CARE_PROVIDER_SITE_OTHER): Payer: BC Managed Care – PPO

## 2011-12-16 DIAGNOSIS — E291 Testicular hypofunction: Secondary | ICD-10-CM

## 2011-12-16 DIAGNOSIS — Z0000 Encounter for general adult medical examination without abnormal findings: Secondary | ICD-10-CM

## 2011-12-16 DIAGNOSIS — E119 Type 2 diabetes mellitus without complications: Secondary | ICD-10-CM

## 2011-12-16 DIAGNOSIS — Z0389 Encounter for observation for other suspected diseases and conditions ruled out: Secondary | ICD-10-CM

## 2011-12-16 LAB — URINALYSIS, ROUTINE W REFLEX MICROSCOPIC
Hgb urine dipstick: NEGATIVE
Leukocytes, UA: NEGATIVE
Specific Gravity, Urine: >=1.03 (ref 1.000–1.030)
Urobilinogen, UA: 0.2 (ref 0.0–1.0)

## 2011-12-16 LAB — LIPID PANEL
Total CHOL/HDL Ratio: 2
VLDL: 18 mg/dL (ref 0.0–40.0)

## 2011-12-16 LAB — TESTOSTERONE: Testosterone: 295.05 ng/dL — ABNORMAL LOW (ref 350.00–890.00)

## 2011-12-16 LAB — HEPATIC FUNCTION PANEL
AST: 53 U/L — ABNORMAL HIGH (ref 0–37)
Albumin: 3.9 g/dL (ref 3.5–5.2)
Alkaline Phosphatase: 56 U/L (ref 39–117)
Bilirubin, Direct: 0.2 mg/dL (ref 0.0–0.3)
Total Protein: 7 g/dL (ref 6.0–8.3)

## 2011-12-16 LAB — BASIC METABOLIC PANEL
CO2: 23 mEq/L (ref 19–32)
GFR: 92.28 mL/min (ref >60.00)
Glucose, Bld: 131 mg/dL — ABNORMAL HIGH (ref 70–99)
Potassium: 4.8 mEq/L (ref 3.5–5.1)
Sodium: 136 mEq/L (ref 135–145)

## 2011-12-16 LAB — CBC WITH DIFFERENTIAL/PLATELET
Eosinophils Relative: 1.9 % (ref 0.0–5.0)
Monocytes Relative: 6.6 % (ref 3.0–12.0)
Neutrophils Relative %: 49.2 % (ref 43.0–77.0)
Platelets: 124 10*3/uL — ABNORMAL LOW (ref 150.0–400.0)
WBC: 6 10*3/uL (ref 4.5–10.5)

## 2011-12-16 LAB — PSA: PSA: 0.25 ng/mL (ref 0.10–4.00)

## 2011-12-16 LAB — HEMOGLOBIN A1C: Hgb A1c MFr Bld: 7.1 % — ABNORMAL HIGH (ref 4.6–6.5)

## 2011-12-16 LAB — TSH: TSH: 1.21 u[IU]/mL (ref 0.35–5.50)

## 2011-12-22 ENCOUNTER — Ambulatory Visit (INDEPENDENT_AMBULATORY_CARE_PROVIDER_SITE_OTHER): Payer: BC Managed Care – PPO | Admitting: Endocrinology

## 2011-12-22 ENCOUNTER — Encounter: Payer: Self-pay | Admitting: Endocrinology

## 2011-12-22 VITALS — BP 136/82 | HR 78 | Temp 98.5°F | Ht 72.0 in | Wt 289.0 lb

## 2011-12-22 DIAGNOSIS — E119 Type 2 diabetes mellitus without complications: Secondary | ICD-10-CM

## 2011-12-22 MED ORDER — CELECOXIB 200 MG PO CAPS: 200.0000 mg | ORAL_CAPSULE | Freq: Every day | ORAL | Status: DC | PRN

## 2011-12-22 NOTE — Patient Instructions (Addendum)
We'll recheck the liver and testosterone at your next appointment. please consider these measures for your health:  minimize alcohol.  do not use tobacco products.  have a colonoscopy at least every 10 years from age 56.  keep firearms safely stored.  always use seat belts.  have working smoke alarms in your home.  see an eye doctor and dentist regularly.  never drive under the influence of alcohol or drugs (including prescription drugs).  those with fair skin should take precautions against the sun.

## 2011-12-22 NOTE — Progress Notes (Signed)
Subjective:    Patient ID: [name] [name], male    DOB: 20-Dec-1955, 56 y.o.   MRN: [id number]  HPI here for regular wellness examination.  He's feeling pretty well in general, and says chronic med probs are stable, except as noted below Past Medical History  Diagnosis Date  . Coronary atherosclerosis of native coronary artery     DES RCA 10/05  . Stroke     Left pons 6/11 - Aggrenox started  . Mixed hyperlipidemia   . Type 2 diabetes mellitus   . Cholelithiasis   . Testicular cancer 1978    Past Surgical History  Procedure Date  . Testicular tumor resection     Right side    History   Social History  . Marital Status: Married    Spouse Name: N/A    Number of Children: 0  . Years of Education: N/A   Occupational History  . Truck driver    Social History Main Topics  . Smoking status: Former Smoker -- 1.5 packs/day for 43 years    Types: Cigarettes    Quit date: 04/21/2010  . Smokeless tobacco: Never Used  . Alcohol Use: No  . Drug Use: No  . Sexually Active: Not on file   Other Topics Concern  . Not on file   Social History Narrative  . No narrative on file    Current Outpatient Prescriptions on File Prior to Visit  Medication Sig Dispense Refill  . AGGRENOX 25-200 MG per 12 hr capsule TAKE 1 CAPSULE TWICE A DAY  180 capsule  2  . colesevelam (WELCHOL) 625 MG tablet Take 1,875 mg by mouth daily.        . fish oil-omega-3 fatty acids 1000 MG capsule Take 1 capsule by mouth 2 (two) times daily.       . metFORMIN (GLUCOPHAGE) 500 MG tablet TAKE 1 TABLET TWICE A DAY  180 tablet  2  . nitroGLYCERIN (NITROSTAT) 0.4 MG SL tablet Place 1 tablet (0.4 mg total) under the tongue every 5 (five) minutes as needed. May repeat every 5 minutes for up to 3 doses  25 tablet  6  . pioglitazone (ACTOS) 30 MG tablet TAKE 1 TABLET DAILY  90 tablet  2  . simvastatin (ZOCOR) 80 MG tablet Take 1 tablet (80 mg total) by mouth at bedtime.  90 tablet  0  . sitaGLIPtan (JANUVIA) 100 MG  tablet Take 1 tablet (100 mg total) by mouth daily.  90 tablet  3  . Testosterone (ANDRODERM) 2 MG/24HR PT24 Place 1 patch onto the skin daily.  90 patch  1    No Known Allergies  Family History  Problem Relation Age of Onset  . Diabetes type II Father     BP 142/78  Pulse 78  Temp(Src) 98.5 F (36.9 C) (Oral)  Ht 6' (1.829 m)  Wt 289 lb (131.09 kg)  BMI 39.20 kg/m2  SpO2 96%    Review of Systems  Constitutional: Negative for fever and unexpected weight change.  HENT: Negative for hearing loss.   Eyes: Negative for visual disturbance.  Respiratory: Negative for shortness of breath.   Cardiovascular: Negative for chest pain.  Gastrointestinal: Negative for blood in stool.  Genitourinary: Negative for hematuria and difficulty urinating.  Musculoskeletal: Negative for back pain.  Skin: Negative for rash.  Neurological: Negative for syncope and numbness.  Hematological: Does not bruise/bleed easily.  Psychiatric/Behavioral: Negative for dysphoric mood.       Objective:   Physical  Exam VS: see vs page GEN: no distress HEAD: head: no deformity eyes: no periorbital swelling, no proptosis external nose and ears are normal mouth: no lesion seen NECK: supple, thyroid is not enlarged CHEST WALL: no deformity LUNGS: clear to auscultation CV: reg rate and rhythm, no murmur ABD: abdomen is soft, nontender.  no hepatosplenomegaly.  not distended.  no hernia.    RECTAL: normal external and internal exam.  heme neg. PROSTATE:  Normal size.  No nodule MUSCULOSKELETAL: muscle bulk and strength are grossly normal.  no obvious joint swelling.  gait is normal and steady EXTEMITIES: no deformity.  no ulcer on the feet.  feet are of normal color and temp.  no edema PULSES: dorsalis pedis intact bilat.  no carotid bruit NEURO:  cn 2-12 grossly intact.   readily moves all 4's.  sensation is intact to touch on the feet SKIN:  Normal texture and temperature.  No rash or suspicious lesion  is visible.   NODES:  None palpable at the neck PSYCH: alert, oriented x3.  Does not appear anxious nor depressed.     Assessment & Plan:  Wellness visit today, with problems stable, except as noted.   SEPARATE EVALUATION FOLLOWS--EACH PROBLEM HERE IS NEW, NOT RESPONDING TO TREATMENT, OR POSES SIGNIFICANT RISK TO THE PATIENT'S HEALTH: HISTORY OF THE PRESENT ILLNESS: Pt says he feels no better since on the testosterone. He has slight breast pain.   PAST MEDICAL HISTORY reviewed and up to date today REVIEW OF SYSTEMS: Denies decreased urinary stream PHYSICAL EXAMINATION: VITAL SIGNS:  See vs page GENERAL: no distress BREASTS:  Slight bilat gynecomastia and pseudogynecomastia. LAB/XRAY RESULTS: Hepatic panel is noted Lab Results  Component Value Date   TESTOSTERONE 295.05* 12/16/2011  IMPRESSION: elev hepatic transaminases, uncertain etiology Hypogonadism, improved.  However,  PLAN: See instruction page

## 2011-12-30 ENCOUNTER — Other Ambulatory Visit: Payer: Self-pay | Admitting: Endocrinology

## 2012-02-01 ENCOUNTER — Other Ambulatory Visit: Payer: Self-pay | Admitting: Cardiology

## 2012-04-04 ENCOUNTER — Other Ambulatory Visit: Payer: Self-pay | Admitting: *Deleted

## 2012-04-04 MED ORDER — SITAGLIPTIN PHOSPHATE 100 MG PO TABS: 100.0000 mg | ORAL_TABLET | Freq: Every day | ORAL | Status: DC

## 2012-04-04 NOTE — Telephone Encounter (Signed)
Refill junuvia sent to [facility]

## 2012-04-11 ENCOUNTER — Other Ambulatory Visit: Payer: Self-pay | Admitting: Cardiology

## 2012-04-16 ENCOUNTER — Other Ambulatory Visit: Payer: Self-pay | Admitting: Endocrinology

## 2012-05-14 ENCOUNTER — Other Ambulatory Visit: Payer: Self-pay | Admitting: Endocrinology

## 2012-06-20 ENCOUNTER — Other Ambulatory Visit: Payer: Self-pay | Admitting: Cardiology

## 2012-06-20 MED ORDER — SIMVASTATIN 80 MG PO TABS: 80.0000 mg | ORAL_TABLET | Freq: Every day | ORAL | Status: DC

## 2012-06-29 ENCOUNTER — Other Ambulatory Visit: Payer: Self-pay | Admitting: General Practice

## 2012-06-29 MED ORDER — TESTOSTERONE 2 MG/24HR TD PT24: 1.0000 | MEDICATED_PATCH | Freq: Every day | TRANSDERMAL | Status: DC

## 2012-06-30 ENCOUNTER — Other Ambulatory Visit: Payer: Self-pay | Admitting: General Practice

## 2012-07-01 ENCOUNTER — Other Ambulatory Visit: Payer: Self-pay | Admitting: Endocrinology

## 2012-07-01 MED ORDER — TESTOSTERONE 2 MG/24HR TD PT24: 1.0000 | MEDICATED_PATCH | Freq: Every day | TRANSDERMAL | Status: DC

## 2012-07-29 ENCOUNTER — Ambulatory Visit: Payer: BC Managed Care – PPO | Admitting: Cardiology

## 2012-08-09 ENCOUNTER — Other Ambulatory Visit: Payer: Self-pay | Admitting: Endocrinology

## 2012-09-12 ENCOUNTER — Ambulatory Visit (INDEPENDENT_AMBULATORY_CARE_PROVIDER_SITE_OTHER): Payer: BC Managed Care – PPO | Admitting: Cardiology

## 2012-09-12 ENCOUNTER — Encounter: Payer: Self-pay | Admitting: Cardiology

## 2012-09-12 VITALS — BP 138/77 | HR 76 | Ht 73.0 in | Wt 287.0 lb

## 2012-09-12 DIAGNOSIS — E119 Type 2 diabetes mellitus without complications: Secondary | ICD-10-CM

## 2012-09-12 DIAGNOSIS — I2510 Atherosclerotic heart disease of native coronary artery without angina pectoris: Secondary | ICD-10-CM

## 2012-09-12 DIAGNOSIS — E785 Hyperlipidemia, unspecified: Secondary | ICD-10-CM

## 2012-09-12 NOTE — Assessment & Plan Note (Signed)
Lipid control has been good overall, LDL 50 back in March.

## 2012-09-12 NOTE — Progress Notes (Signed)
Clinical Summary Mr. Swett is a 56 y.o.male presenting for followup. He was last seen in May of 2012. Had followup with Dr. Everardo All in March of this year.  Lab work from March reviewed finding AST 53, ALT 57, TSH 1.2, hemoglobin 14.9, platelets 124, potassium 4.8, BUN 18, creatinine 0.9, cholesterol 120, triglycerides 90, HDL 52, LDL 50, hemoglobin A1c 7.1%.  Exercise Cardiolite in October 2010 demonstrated no diagnostic ST segment changes, low risk perfusion imaging with possible very mild ischemia in the inferior wall, LVEF 58%. His ECG today shows normal sinus rhythm.  He reports no active angina symptoms, no shortness of breath beyond NYHA class II. 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 We discussed his medications today.   No Known Allergies  Current Outpatient Prescriptions  Medication Sig Dispense Refill  . AGGRENOX 25-200 MG per 12 hr capsule TAKE 1 CAPSULE TWICE A DAY  60 capsule  10  . celecoxib (CELEBREX) 200 MG capsule Take 1 capsule (200 mg total) by mouth daily as needed.  90 capsule  3  . colesevelam (WELCHOL) 625 MG tablet       . fish oil-omega-3 fatty acids 1000 MG capsule Take 1 capsule by mouth 2 (two) times daily.       . metFORMIN (GLUCOPHAGE) 500 MG tablet TAKE 1 TABLET TWICE A DAY  180 tablet  3  . pioglitazone (ACTOS) 30 MG tablet TAKE 1 TABLET DAILY  90 tablet  2  . simvastatin (ZOCOR) 80 MG tablet Take 1 tablet (80 mg total) by mouth at bedtime.  90 tablet  0  . sitaGLIPtin (JANUVIA) 100 MG tablet Take 1 tablet (100 mg total) by mouth daily.  90 tablet  3  . Testosterone (ANDRODERM) 2 MG/24HR PT24 Place 1 patch onto the skin daily.  90 patch  1  . nitroGLYCERIN (NITROSTAT) 0.4 MG SL tablet Place 1 tablet (0.4 mg total) under the tongue every 5 (five) minutes as needed. May repeat every 5 minutes for up to 3 doses  25 tablet  6    Past Medical History  Diagnosis Date  . Coronary atherosclerosis of native coronary artery     DES RCA 10/05  . Stroke     Left pons 6/11 - Aggrenox started  . Mixed hyperlipidemia   . Type 2 diabetes mellitus   . Cholelithiasis   . Testicular cancer 1978    Social History Mr. Bednarczyk reports that he quit smoking about 2 years ago. His smoking use included Cigarettes. He has a 64.5 pack-year smoking history. He has never used smokeless tobacco. Mr. Pirro reports that he does not drink alcohol.  Review of Systems No palpitations. Complaints of chronic foot pain, neuropathy. No definite claudication symptoms. No orthopnea or PND. No headaches. Otherwise negative.  Physical Examination Filed Vitals:   09/12/12 0841  BP: 138/77  Pulse: 76   Filed Weights   09/12/12 0841  Weight: 287 lb (130.182 kg)    Obese male in no acute distress.  HEENT: Conjunctiva and lids normal, oropharynx clear.  Neck: Supple, no carotid bruits or thyromegaly.  Lungs: Clear to auscultation, nonlabored.  Cardiac: Regular rate and rhythm, no S3.  Abdomen: Obese, nontender, bowel sounds present.  Skin: Warm and dry.  Musculoskeletal: No kyphosis.  Extremity: No pitting edema, distal pulses 2+.  Neuropsychiatric: Alert and oriented x3, affect appropriate.   Problem List and Plan   Coronary atherosclerosis of native coronary artery Symptomatically stable  on medical therapy. No nitroglycerin use. He is on aspirin in the form of Aggrenox, Zocor, WelChol, and omega-3 supplements. No beta blocker over time. ECG is normal today. Previous Cardiolite from October 2010 reviewed. We will see him back in one year, sooner if needed. Likely consider followup stress testing at that time.  DIABETES MELLITUS, TYPE II Followed by Dr. Everardo All. Hemoglobin A1c 7.1% from earlier in the year. Would also be reasonable considering placing him on an ACE inhibitor or ARB.  HYPERLIPIDEMIA Lipid control has been good overall, LDL 50 back in March.    Jonelle Sidle, M.D., F.A.C.C.

## 2012-09-12 NOTE — Assessment & Plan Note (Signed)
Symptomatically stable on medical therapy. No nitroglycerin use. He is on aspirin in the form of Aggrenox, Zocor, WelChol, and omega-3 supplements. No beta blocker over time. ECG is normal today. Previous Cardiolite from October 2010 reviewed. We will see him back in one year, sooner if needed. Likely consider followup stress testing at that time.

## 2012-09-12 NOTE — Assessment & Plan Note (Signed)
Followed by Dr. Everardo All. Hemoglobin A1c 7.1% from earlier in the year. Would also be reasonable considering placing him on an ACE inhibitor or ARB.

## 2012-09-12 NOTE — Patient Instructions (Addendum)

## 2012-10-01 ENCOUNTER — Other Ambulatory Visit: Payer: Self-pay | Admitting: Cardiology

## 2012-11-20 ENCOUNTER — Other Ambulatory Visit: Payer: Self-pay | Admitting: Endocrinology

## 2013-01-18 ENCOUNTER — Other Ambulatory Visit: Payer: Self-pay | Admitting: Endocrinology

## 2013-04-04 ENCOUNTER — Other Ambulatory Visit: Payer: Self-pay | Admitting: *Deleted

## 2013-04-04 ENCOUNTER — Other Ambulatory Visit: Payer: Self-pay | Admitting: Endocrinology

## 2013-04-04 MED ORDER — SITAGLIPTIN PHOSPHATE 100 MG PO TABS: 100.0000 mg | ORAL_TABLET | Freq: Every day | ORAL | Status: DC

## 2013-05-10 ENCOUNTER — Other Ambulatory Visit: Payer: Self-pay

## 2013-05-10 DIAGNOSIS — Z125 Encounter for screening for malignant neoplasm of prostate: Secondary | ICD-10-CM

## 2013-05-10 DIAGNOSIS — Z0000 Encounter for general adult medical examination without abnormal findings: Secondary | ICD-10-CM

## 2013-05-15 ENCOUNTER — Encounter: Payer: BC Managed Care – PPO | Admitting: Endocrinology

## 2013-05-19 ENCOUNTER — Other Ambulatory Visit (INDEPENDENT_AMBULATORY_CARE_PROVIDER_SITE_OTHER): Payer: BC Managed Care – PPO

## 2013-05-19 DIAGNOSIS — Z125 Encounter for screening for malignant neoplasm of prostate: Secondary | ICD-10-CM

## 2013-05-19 DIAGNOSIS — Z0000 Encounter for general adult medical examination without abnormal findings: Secondary | ICD-10-CM

## 2013-05-19 LAB — CBC WITH DIFFERENTIAL/PLATELET
Basophils Absolute: 0 10*3/uL (ref 0.0–0.1)
Eosinophils Absolute: 0.2 10*3/uL (ref 0.0–0.7)
Hemoglobin: 15.1 g/dL (ref 13.0–17.0)
Lymphocytes Relative: 39.4 % (ref 12.0–46.0)
Lymphs Abs: 2.7 10*3/uL (ref 0.7–4.0)
MCHC: 34 g/dL (ref 30.0–36.0)
MCV: 91.5 fl (ref 78.0–100.0)
Monocytes Absolute: 0.4 10*3/uL (ref 0.1–1.0)
Neutro Abs: 3.5 10*3/uL (ref 1.4–7.7)
RDW: 13.2 % (ref 11.5–14.6)

## 2013-05-19 LAB — HEPATIC FUNCTION PANEL
Albumin: 4.1 g/dL (ref 3.5–5.2)
Alkaline Phosphatase: 54 U/L (ref 39–117)
Total Protein: 7.1 g/dL (ref 6.0–8.3)

## 2013-05-19 LAB — LIPID PANEL
Cholesterol: 126 mg/dL (ref 0–200)
HDL: 54.7 mg/dL (ref >39.00)
LDL Cholesterol: 48 mg/dL (ref 0–99)
Triglycerides: 117 mg/dL (ref 0.0–149.0)
VLDL: 23.4 mg/dL (ref 0.0–40.0)

## 2013-05-19 LAB — URINALYSIS, ROUTINE W REFLEX MICROSCOPIC
Hgb urine dipstick: NEGATIVE
Ketones, ur: NEGATIVE
Total Protein, Urine: NEGATIVE
Urine Glucose: NEGATIVE
Urobilinogen, UA: 0.2 (ref 0.0–1.0)

## 2013-05-19 LAB — BASIC METABOLIC PANEL
CO2: 27 mEq/L (ref 19–32)
Chloride: 105 mEq/L (ref 96–112)
Potassium: 4.6 mEq/L (ref 3.5–5.1)
Sodium: 139 mEq/L (ref 135–145)

## 2013-05-19 LAB — HEMOGLOBIN A1C: Hgb A1c MFr Bld: 7.9 % — ABNORMAL HIGH (ref 4.6–6.5)

## 2013-05-19 LAB — TSH: TSH: 1.64 u[IU]/mL (ref 0.35–5.50)

## 2013-05-19 LAB — PSA: PSA: 0.18 ng/mL (ref 0.10–4.00)

## 2013-05-26 ENCOUNTER — Ambulatory Visit (INDEPENDENT_AMBULATORY_CARE_PROVIDER_SITE_OTHER): Payer: BC Managed Care – PPO | Admitting: Endocrinology

## 2013-05-26 ENCOUNTER — Encounter: Payer: Self-pay | Admitting: Endocrinology

## 2013-05-26 VITALS — BP 136/80 | HR 78 | Ht 72.0 in | Wt 292.0 lb

## 2013-05-26 DIAGNOSIS — E785 Hyperlipidemia, unspecified: Secondary | ICD-10-CM

## 2013-05-26 DIAGNOSIS — Z79899 Other long term (current) drug therapy: Secondary | ICD-10-CM

## 2013-05-26 DIAGNOSIS — Z0000 Encounter for general adult medical examination without abnormal findings: Secondary | ICD-10-CM | POA: Insufficient documentation

## 2013-05-26 DIAGNOSIS — E119 Type 2 diabetes mellitus without complications: Secondary | ICD-10-CM

## 2013-05-26 DIAGNOSIS — Z125 Encounter for screening for malignant neoplasm of prostate: Secondary | ICD-10-CM | POA: Insufficient documentation

## 2013-05-26 DIAGNOSIS — I10 Essential (primary) hypertension: Secondary | ICD-10-CM

## 2013-05-26 MED ORDER — CANAGLIFLOZIN 300 MG PO TABS: 1.0000 | ORAL_TABLET | Freq: Every day | ORAL | Status: DC

## 2013-05-26 NOTE — Progress Notes (Signed)
Subjective:    Patient ID: [name] [name], male    DOB: 1956/07/03, 57 y.o.   MRN: [id number]  HPI Pt is here for regular wellness examination, and is feeling pretty well in general, and says chronic med probs are stable, except as noted below Past Medical History  Diagnosis Date  . Coronary atherosclerosis of native coronary artery     DES RCA 10/05  . Stroke     Left pons 6/11 - Aggrenox started  . Mixed hyperlipidemia   . Type 2 diabetes mellitus   . Cholelithiasis   . Testicular cancer 1978    Past Surgical History  Procedure Laterality Date  . Testicular tumor resection      Right side    History   Social History  . Marital Status: Married    Spouse Name: N/A    Number of Children: 0  . Years of Education: N/A   Occupational History  . Truck driver    Social History Main Topics  . Smoking status: Former Smoker -- 1.50 packs/day for 43 years    Types: Cigarettes    Quit date: 04/21/2010  . Smokeless tobacco: Never Used  . Alcohol Use: No  . Drug Use: No  . Sexual Activity: Not on file   Other Topics Concern  . Not on file   Social History Narrative  . No narrative on file    Current Outpatient Prescriptions on File Prior to Visit  Medication Sig Dispense Refill  . AGGRENOX 25-200 MG per 12 hr capsule TAKE 1 CAPSULE TWICE A DAY  60 capsule  10  . celecoxib (CELEBREX) 200 MG capsule Take 1 capsule (200 mg total) by mouth daily as needed.  90 capsule  3  . colesevelam (WELCHOL) 625 MG tablet       . fish oil-omega-3 fatty acids 1000 MG capsule Take 1 capsule by mouth 2 (two) times daily.       . metFORMIN (GLUCOPHAGE) 500 MG tablet TAKE 1 TABLET TWICE A DAY  180 tablet  2  . nitroGLYCERIN (NITROSTAT) 0.4 MG SL tablet Place 1 tablet (0.4 mg total) under the tongue every 5 (five) minutes as needed. May repeat every 5 minutes for up to 3 doses  25 tablet  6  . pioglitazone (ACTOS) 30 MG tablet TAKE 1 TABLET DAILY  90 tablet  0  . simvastatin (ZOCOR) 80 MG  tablet TAKE 1 TABLET AT BEDTIME (PATIENT NEED OFFICE VISIT WITH [facility]. CALL OFFICE TO SCHEDULE APPOINTMENT)  90 tablet  6  . sitaGLIPtin (JANUVIA) 100 MG tablet Take 1 tablet (100 mg total) by mouth daily.  90 tablet  0  . Testosterone (ANDRODERM) 2 MG/24HR PT24 Place 1 patch onto the skin daily.  90 patch  1   No current facility-administered medications on file prior to visit.    No Known Allergies  Family History  Problem Relation Age of Onset  . Diabetes type II Father    BP 136/80  Pulse 78  Ht 6' (1.829 m)  Wt 292 lb (132.45 kg)  BMI 39.59 kg/m2  SpO2 98%  Review of Systems  Constitutional: Negative for fever and unexpected weight change.  HENT: Negative for hearing loss.   Eyes: Negative for visual disturbance.  Respiratory: Positive for shortness of breath.   Cardiovascular: Negative for chest pain.  Gastrointestinal: Negative for anal bleeding.  Endocrine: Negative for cold intolerance.  Genitourinary: Negative for hematuria and difficulty urinating.  Musculoskeletal: Negative for back pain.  Skin:  Negative for rash.  Allergic/Immunologic: Negative for environmental allergies.  Neurological: Negative for syncope and numbness.  Hematological: Bruises/bleeds easily.  Psychiatric/Behavioral: Negative for dysphoric mood.      Objective:   Physical Exam VS: see vs page GEN: no distress HEAD: head: no deformity eyes: no periorbital swelling, no proptosis external nose and ears are normal mouth: no lesion seen NECK: supple, thyroid is not enlarged CHEST WALL: no deformity LUNGS: clear to auscultation BREASTS:  No gynecomastia CV: reg rate and rhythm, no murmur ABD: abdomen is soft, nontender.  no hepatosplenomegaly.  not distended.  no hernia RECTAL: normal external and internal exam.  heme neg. PROSTATE:  Normal size.  No nodule MUSCULOSKELETAL: muscle bulk and strength are grossly normal.  no obvious joint swelling.  gait is normal and steady. PULSES: no  carotid bruit NEURO:  cn 2-12 grossly intact.   readily moves all 4's.   SKIN:  Normal texture and temperature.  No rash or suspicious lesion is visible.   NODES:  None palpable at the neck PSYCH: alert, oriented x3.  Does not appear anxious nor depressed.       Assessment & Plan:  Wellness visit today, with problems stable, except as noted.

## 2013-05-26 NOTE — Patient Instructions (Addendum)
i have sent a prescription to your pharmacy, to add "invokana."   please consider these measures for your health:  minimize alcohol.  do not use tobacco products.  have a colonoscopy at least every 10 years from age 57.  keep firearms safely stored.  always use seat belts.  have working smoke alarms in your home.  see an eye doctor and dentist regularly.  never drive under the influence of alcohol or drugs (including prescription drugs).  those with fair skin should take precautions against the sun.   Please come back for a follow-up appointment in 3 months.

## 2013-06-12 ENCOUNTER — Telehealth: Payer: Self-pay

## 2013-06-12 NOTE — Telephone Encounter (Signed)
fine

## 2013-06-12 NOTE — Telephone Encounter (Signed)
Pt's wife states the Carole Civil has made the patient nauseated so pt decided he would take 1/2 of the pill in the am and the other 1/2 at night, is this ok to do [phone]

## 2013-06-12 NOTE — Telephone Encounter (Signed)
Pt's wife advised.

## 2013-06-22 ENCOUNTER — Telehealth: Payer: Self-pay | Admitting: Endocrinology

## 2013-06-22 NOTE — Telephone Encounter (Signed)
Wife called, pt's meters are old. Wants to see if we can provide a newer meter and strips. Please call wife, Alexia Freestone / Roanna Raider

## 2013-06-22 NOTE — Telephone Encounter (Signed)
Pt's wife advised to get meter

## 2013-07-04 ENCOUNTER — Telehealth: Payer: Self-pay | Admitting: Endocrinology

## 2013-07-04 NOTE — Telephone Encounter (Signed)
error 

## 2013-07-05 ENCOUNTER — Telehealth: Payer: Self-pay | Admitting: Endocrinology

## 2013-07-10 ENCOUNTER — Other Ambulatory Visit: Payer: Self-pay

## 2013-07-10 MED ORDER — GLUCOSE BLOOD VI STRP: ORAL_STRIP | Status: DC

## 2013-07-10 NOTE — Telephone Encounter (Signed)
Pt's wife called back stating pt test cbg once daily, refill sent for verio iq test strips

## 2013-07-13 ENCOUNTER — Other Ambulatory Visit: Payer: Self-pay | Admitting: Endocrinology

## 2013-07-13 ENCOUNTER — Other Ambulatory Visit: Payer: Self-pay | Admitting: *Deleted

## 2013-07-13 MED ORDER — ASPIRIN-DIPYRIDAMOLE ER 25-200 MG PO CP12: 1.0000 | ORAL_CAPSULE | Freq: Two times a day (BID) | ORAL | Status: DC

## 2013-07-27 ENCOUNTER — Other Ambulatory Visit: Payer: Self-pay | Admitting: Endocrinology

## 2013-08-04 ENCOUNTER — Ambulatory Visit (INDEPENDENT_AMBULATORY_CARE_PROVIDER_SITE_OTHER): Payer: BC Managed Care – PPO | Admitting: Cardiology

## 2013-08-04 ENCOUNTER — Encounter: Payer: Self-pay | Admitting: *Deleted

## 2013-08-04 ENCOUNTER — Encounter: Payer: Self-pay | Admitting: Cardiology

## 2013-08-04 VITALS — BP 137/81 | HR 74 | Ht 72.5 in | Wt 277.0 lb

## 2013-08-04 DIAGNOSIS — E785 Hyperlipidemia, unspecified: Secondary | ICD-10-CM

## 2013-08-04 DIAGNOSIS — I2510 Atherosclerotic heart disease of native coronary artery without angina pectoris: Secondary | ICD-10-CM

## 2013-08-04 DIAGNOSIS — I10 Essential (primary) hypertension: Secondary | ICD-10-CM

## 2013-08-04 DIAGNOSIS — R0602 Shortness of breath: Secondary | ICD-10-CM

## 2013-08-04 MED ORDER — SIMVASTATIN 40 MG PO TABS: 40.0000 mg | ORAL_TABLET | Freq: Every day | ORAL | Status: DC

## 2013-08-04 NOTE — Assessment & Plan Note (Signed)
Recent lab work reviewed, LDL was 48. Also mild elevations in AST and ALT. This could be due to hepatic steatosis, although I have asked him to reduce his Zocor to 40 mg in the evening, suspect that this will still provide good LDL control and reduce possible side effects.

## 2013-08-04 NOTE — Patient Instructions (Signed)
Your physician recommends that you schedule a follow-up appointment in: 6 months. You will receive a reminder letter in the mail in about 4 months reminding you to call and schedule your appointment. If you don't receive this letter, please contact our office. Your physician has recommended you make the following change in your medication: Decrease your simvastatin to 40 mg daily. Please break your 80 mg tablet in half daily until they are finished, then start your new prescription. All other medications will remain the same. Your physician has requested that you have en exercise stress myoview. For further information please visit [email or web address] Please follow instruction sheet, as given.

## 2013-08-04 NOTE — Assessment & Plan Note (Signed)
No change to current regimen. 

## 2013-08-04 NOTE — Progress Notes (Signed)
Clinical Summary Mr. [name] [name] is a 57 y.o.male last seen in December 2013. He is here with his wife. He denies any exertional angina, has not required nitroglycerin. Does seem to feel more exertional fatigue and breathlessness however compared to last visit.  Lipid panel from August showed cholesterol 126, cholesterol is 117, HDL 54, LDL 48. Of note, AST was recently 52 and ALT 65. Last hemoglobin A1c 7.9.  Exercise Cardiolite in October 2010 demonstrated no diagnostic ST segment changes, low risk perfusion imaging with possible very mild ischemia in the inferior wall, LVEF 58%. His ECG today shows normal sinus rhythm.  ECG today shows normal sinus rhythm.   No Known Allergies  Current Outpatient Prescriptions  Medication Sig Dispense Refill  . Canagliflozin (INVOKANA) 300 MG TABS Take 1 tablet (300 mg total) by mouth daily.  90 tablet  3  . celecoxib (CELEBREX) 200 MG capsule Take 1 capsule (200 mg total) by mouth daily as needed.  90 capsule  3  . dipyridamole-aspirin (AGGRENOX) 200-25 MG per 12 hr capsule Take 1 capsule by mouth 2 (two) times daily.  180 capsule  3  . fish oil-omega-3 fatty acids 1000 MG capsule Take 1 capsule by mouth 2 (two) times daily.       Marland Kitchen glucose blood test strip Use as instructed  100 each  12  . metFORMIN (GLUCOPHAGE) 500 MG tablet TAKE 1 TABLET TWICE A DAY  180 tablet  2  . nitroGLYCERIN (NITROSTAT) 0.4 MG SL tablet Place 1 tablet (0.4 mg total) under the tongue every 5 (five) minutes as needed. May repeat every 5 minutes for up to 3 doses  25 tablet  6  . pioglitazone (ACTOS) 30 MG tablet TAKE 1 TABLET DAILY  90 tablet  0  . sitaGLIPtin (JANUVIA) 100 MG tablet Take 1 tablet (100 mg total) by mouth daily.  90 tablet  0  . WELCHOL 625 MG tablet TAKE 3 TABLETS (1875 MG) TWICE A DAY WITH MEALS  540 tablet  1   No current facility-administered medications for this visit.    Past Medical History  Diagnosis Date  . Coronary atherosclerosis of native coronary  artery     DES RCA 10/05  . Stroke     Left pons 6/11 - Aggrenox started  . Mixed hyperlipidemia   . Type 2 diabetes mellitus   . Cholelithiasis   . Testicular cancer 1978    Social History Mr. Swatek reports that he quit smoking about 3 years ago. His smoking use included Cigarettes. He has a 64.5 pack-year smoking history. He has never used smokeless tobacco. Mr. Hudnall reports that he does not drink alcohol.  Review of Systems No palpitations, dizziness, syncope. No claudication. Otherwise negative.  Physical Examination Filed Vitals:   08/04/13 0935  BP: 137/81  Pulse: 74   Filed Weights   08/04/13 0935  Weight: 277 lb (125.646 kg)    Obese male in no acute distress.  HEENT: Conjunctiva and lids normal, oropharynx clear.  Neck: Supple, no carotid bruits or thyromegaly.  Lungs: Clear to auscultation, nonlabored.  Cardiac: Regular rate and rhythm, no S3.  Abdomen: Obese, nontender, bowel sounds present.  Skin: Warm and dry.  Musculoskeletal: No kyphosis.  Extremity: No pitting edema, distal pulses 2+.  Neuropsychiatric: Alert and oriented x3, affect appropriate.    Problem List and Plan   Coronary atherosclerosis of native coronary artery History of DES to the RCA in 2005. Last ischemic test was 4 years ago. He  is reporting an increased since of fatigue and exertional breathlessness. ECG reviewed above. Plan is to followup with an exercise Cardiolite on medical therapy for reassessment of ischemic burden.  HYPERTENSION No change to current regimen.  HYPERLIPIDEMIA Recent lab work reviewed, LDL was 48. Also mild elevations in AST and ALT. This could be due to hepatic steatosis, although I have asked him to reduce his Zocor to 40 mg in the evening, suspect that this will still provide good LDL control and reduce possible side effects.    Jonelle Sidle, M.D., F.A.C.C.

## 2013-08-04 NOTE — Assessment & Plan Note (Signed)
History of DES to the RCA in 2005. Last ischemic test was 4 years ago. He is reporting an increased since of fatigue and exertional breathlessness. ECG reviewed above. Plan is to followup with an exercise Cardiolite on medical therapy for reassessment of ischemic burden.

## 2013-08-18 ENCOUNTER — Encounter (HOSPITAL_COMMUNITY)
Admission: RE | Admit: 2013-08-18 | Discharge: 2013-08-18 | Disposition: A | Discharge status: Home / Self Care | Payer: BC Managed Care – PPO | Source: Ambulatory Visit | Attending: Cardiology | Admitting: Cardiology

## 2013-08-18 ENCOUNTER — Encounter (HOSPITAL_COMMUNITY): Payer: Self-pay

## 2013-08-18 DIAGNOSIS — I2510 Atherosclerotic heart disease of native coronary artery without angina pectoris: Secondary | ICD-10-CM

## 2013-08-18 DIAGNOSIS — R0602 Shortness of breath: Secondary | ICD-10-CM

## 2013-08-18 DIAGNOSIS — R079 Chest pain, unspecified: Secondary | ICD-10-CM | POA: Insufficient documentation

## 2013-08-18 MED ORDER — SODIUM CHLORIDE 0.9 % IJ SOLN
INTRAMUSCULAR | Status: AC
  Administered 2013-08-18: 10 mL via INTRAVENOUS
  Filled 2013-08-18: qty 10

## 2013-08-18 MED ORDER — TECHNETIUM TC 99M SESTAMIBI - CARDIOLITE
30.0000 | Freq: Once | INTRAVENOUS | Status: AC | PRN
  Administered 2013-08-18: 11:00:00 30 via INTRAVENOUS

## 2013-08-18 MED ORDER — TECHNETIUM TC 99M SESTAMIBI GENERIC - CARDIOLITE
10.0000 | Freq: Once | INTRAVENOUS | Status: AC | PRN
  Administered 2013-08-18: 10 via INTRAVENOUS

## 2013-08-18 NOTE — Progress Notes (Signed)
Stress Lab Nurses Notes - Brennyn Haisley 08/18/2013 Reason for doing test: CAD and Chest Pain Type of test: Stress Cardiolite Nurse performing test: Parke Poisson, RN Nuclear Medicine Tech: Lyndel Pleasure Echo Tech: Not Applicable MD performing test: S. McDowell/ K.Lyman Bishop NP Family MD: Romero Belling  Test explained and consent signed: yes IV started: 22g jelco, Saline lock flushed, No redness or edema and Saline lock started in radiology Symptoms: Fatigue Treatment/Intervention: None Reason test stopped: reached target HR After recovery IV was: Discontinued via X-ray tech and No redness or edema Patient to return to Nuc. Med at : 11:30 Patient discharged: Home Patient's Condition upon discharge was: stable Comments: During test peak BP 202/55 & HR 142.  Recovery BP 152/61 & HR 90.  Symptoms resolved in recovery. Erskine Speed T

## 2013-08-21 ENCOUNTER — Telehealth: Payer: Self-pay | Admitting: *Deleted

## 2013-08-21 NOTE — Telephone Encounter (Signed)
Message copied by Eustace Moore on Mon Aug 21, 2013 12:38 PM ------      Message from: Jonelle Sidle      Created: Fri Aug 18, 2013  4:20 PM       Reviewed. Please let him know that the study was low risk and does not suggest significant progression compared to previous report. Would keep an eye on symptoms, we can certainly look further if things progress. ------

## 2013-08-22 NOTE — Telephone Encounter (Signed)
Patient informed. 

## 2013-09-01 ENCOUNTER — Ambulatory Visit (INDEPENDENT_AMBULATORY_CARE_PROVIDER_SITE_OTHER): Payer: BC Managed Care – PPO | Admitting: Endocrinology

## 2013-09-01 ENCOUNTER — Encounter: Payer: Self-pay | Admitting: *Deleted

## 2013-09-01 ENCOUNTER — Encounter: Payer: Self-pay | Admitting: Endocrinology

## 2013-09-01 VITALS — BP 118/70 | HR 77 | Temp 97.9°F | Ht 72.5 in | Wt 271.0 lb

## 2013-09-01 DIAGNOSIS — E1159 Type 2 diabetes mellitus with other circulatory complications: Secondary | ICD-10-CM

## 2013-09-01 DIAGNOSIS — E119 Type 2 diabetes mellitus without complications: Secondary | ICD-10-CM

## 2013-09-01 LAB — HEMOGLOBIN A1C: Hgb A1c MFr Bld: 7.1 % — ABNORMAL HIGH (ref 4.6–6.5)

## 2013-09-01 LAB — BASIC METABOLIC PANEL
BUN: 18 mg/dL (ref 6–23)
CO2: 26 mEq/L (ref 19–32)
Calcium: 9.8 mg/dL (ref 8.4–10.5)
Creatinine, Ser: 1.2 mg/dL (ref 0.4–1.5)
GFR: 68.45 mL/min (ref >60.00)
Glucose, Bld: 173 mg/dL — ABNORMAL HIGH (ref 70–99)
Potassium: 4.1 mEq/L (ref 3.5–5.1)
Sodium: 138 mEq/L (ref 135–145)

## 2013-09-01 MED ORDER — GLUCOSE BLOOD VI STRP: 1.0000 | ORAL_STRIP | Freq: Two times a day (BID) | Status: DC

## 2013-09-01 MED ORDER — BROMOCRIPTINE MESYLATE 2.5 MG PO TABS: 1.2500 mg | ORAL_TABLET | Freq: Every day | ORAL | Status: DC

## 2013-09-01 NOTE — Patient Instructions (Addendum)
blood tests are being requested for you today.  We'll contact you with results. If it is high, i will send a prescription to your pharmacy, to add "bromocriptine."   please consider these measures for your health:  minimize alcohol.  do not use tobacco products.  have a colonoscopy at least every 10 years from age 59.  keep firearms safely stored.  always use seat belts.  have working smoke alarms in your home.  see an eye doctor and dentist regularly.  never drive under the influence of alcohol or drugs (including prescription drugs).  those with fair skin should take precautions against the sun.   Please come back for a follow-up appointment in 3 months.

## 2013-09-01 NOTE — Progress Notes (Signed)
Subjective:    Patient ID: [name] [name], male    DOB: 31-Aug-1956, 57 y.o.   MRN: [id number]  HPI Pt returns for f/u of type 2 DM (dx'ed 1999, on a routine blood test; he has mild if any neuropathy of the lower extremities, but he has associated assoc CAD and CVA).  he brings a record of his cbg's which i have reviewed today.  Most are in the mid-100's.  It is in general higher as the day goes on.  pt states he feels well in general. Past Medical History  Diagnosis Date  . Coronary atherosclerosis of native coronary artery     DES RCA 10/05  . Stroke     Left pons 6/11 - Aggrenox started  . Mixed hyperlipidemia   . Type 2 diabetes mellitus   . Cholelithiasis   . Testicular cancer 1978    Past Surgical History  Procedure Laterality Date  . Testicular tumor resection      Right side    History   Social History  . Marital Status: Married    Spouse Name: N/A    Number of Children: 0  . Years of Education: N/A   Occupational History  . Truck driver    Social History Main Topics  . Smoking status: Former Smoker -- 1.50 packs/day for 43 years    Types: Cigarettes    Quit date: 04/21/2010  . Smokeless tobacco: Never Used  . Alcohol Use: No  . Drug Use: No  . Sexual Activity: Not on file   Other Topics Concern  . Not on file   Social History Narrative  . No narrative on file    Current Outpatient Prescriptions on File Prior to Visit  Medication Sig Dispense Refill  . Canagliflozin (INVOKANA) 300 MG TABS Take 1 tablet (300 mg total) by mouth daily.  90 tablet  3  . celecoxib (CELEBREX) 200 MG capsule Take 1 capsule (200 mg total) by mouth daily as needed.  90 capsule  3  . dipyridamole-aspirin (AGGRENOX) 200-25 MG per 12 hr capsule Take 1 capsule by mouth 2 (two) times daily.  180 capsule  3  . fish oil-omega-3 fatty acids 1000 MG capsule Take 1 capsule by mouth 2 (two) times daily.       . metFORMIN (GLUCOPHAGE) 500 MG tablet TAKE 1 TABLET TWICE A DAY  180 tablet   2  . nitroGLYCERIN (NITROSTAT) 0.4 MG SL tablet Place 1 tablet (0.4 mg total) under the tongue every 5 (five) minutes as needed. May repeat every 5 minutes for up to 3 doses  25 tablet  6  . pioglitazone (ACTOS) 30 MG tablet TAKE 1 TABLET DAILY  90 tablet  0  . simvastatin (ZOCOR) 40 MG tablet Take 1 tablet (40 mg total) by mouth at bedtime.  90 tablet  3  . sitaGLIPtin (JANUVIA) 100 MG tablet Take 1 tablet (100 mg total) by mouth daily.  90 tablet  0  . WELCHOL 625 MG tablet TAKE 3 TABLETS (1875 MG) TWICE A DAY WITH MEALS  540 tablet  1   No current facility-administered medications on file prior to visit.    No Known Allergies  Family History  Problem Relation Age of Onset  . Diabetes type II Father     BP 118/70  Pulse 77  Temp(Src) 97.9 F (36.6 C) (Oral)  Ht 6' 0.5" (1.842 m)  Wt 271 lb (122.925 kg)  BMI 36.23 kg/m2  SpO2 97%  Review of  Systems denies hypoglycemia.  He has lost weight.     Objective:   Physical Exam VITAL SIGNS:  See vs page GENERAL: no distress   Lab Results  Component Value Date   HGBA1C 7.1* 09/01/2013      Assessment & Plan:  DM: Needs increased rx, if it can be done with a regimen that avoids or minimizes hypoglycemia. 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 Weight loss: this helps control of DM

## 2013-09-02 DIAGNOSIS — E119 Type 2 diabetes mellitus without complications: Secondary | ICD-10-CM | POA: Insufficient documentation

## 2013-09-02 DIAGNOSIS — E1142 Type 2 diabetes mellitus with diabetic polyneuropathy: Secondary | ICD-10-CM | POA: Insufficient documentation

## 2013-10-10 ENCOUNTER — Other Ambulatory Visit: Payer: Self-pay | Admitting: Endocrinology

## 2013-11-06 ENCOUNTER — Telehealth: Payer: Self-pay

## 2013-11-06 NOTE — Telephone Encounter (Signed)
Pt called inquiring if it would be ok to come in and have a DOT medical exam done. Pt informed that would be fine do to the fact that Dr. [name] is PCP.

## 2013-11-13 ENCOUNTER — Telehealth: Payer: Self-pay

## 2013-11-13 NOTE — Telephone Encounter (Signed)
Yes, that is a new rule.  For example, here is a place that is on our same computer system, so they have access to the same screen i am looking at.  You may find this easier: [facility]  Employer: [employer]  [address], [place], [place], [address]  [phone]

## 2013-11-13 NOTE — Telephone Encounter (Signed)
i am unaware of this restriction.

## 2013-11-13 NOTE — Telephone Encounter (Signed)
Wife informed and pt's appointment cancelled.

## 2013-11-13 NOTE — Telephone Encounter (Signed)
Pt is coming in for a physical for his job this coming Friday. 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 Pt stated that he checked a physicians list who were able to preform this type of physical and your name was not on the list.  Wanted to verify that you would be able to do physical.  Please advise, Thanks!

## 2013-11-13 NOTE — Telephone Encounter (Signed)
Please disregard where i typed: "i am unaware of this restriction."

## 2013-11-17 ENCOUNTER — Encounter: Payer: BC Managed Care – PPO | Admitting: Endocrinology

## 2013-11-24 ENCOUNTER — Ambulatory Visit: Payer: Self-pay | Admitting: Family Medicine

## 2013-11-24 VITALS — BP 130/80 | HR 72 | Temp 97.1°F | Resp 18 | Ht 71.5 in | Wt 270.0 lb

## 2013-11-24 DIAGNOSIS — Z8673 Personal history of transient ischemic attack (TIA), and cerebral infarction without residual deficits: Secondary | ICD-10-CM

## 2013-11-24 DIAGNOSIS — Z021 Encounter for pre-employment examination: Secondary | ICD-10-CM

## 2013-11-24 DIAGNOSIS — E119 Type 2 diabetes mellitus without complications: Secondary | ICD-10-CM

## 2013-11-24 DIAGNOSIS — Z0289 Encounter for other administrative examinations: Secondary | ICD-10-CM

## 2013-11-24 DIAGNOSIS — I25810 Atherosclerosis of coronary artery bypass graft(s) without angina pectoris: Secondary | ICD-10-CM

## 2013-11-24 LAB — GLUCOSE, POCT (MANUAL RESULT ENTRY): POC GLUCOSE: 92 mg/dL (ref 70–99)

## 2013-11-24 NOTE — Progress Notes (Signed)
[facility] [address] [place]  [address] [phone]- [phone]  Date:  11/24/2013   Name:  [name] [name]   DOB:  [birth year]   MRN:  [id number]  PCP:  Renato Shin, MD    Chief Complaint: Employment Physical   History of Present Illness:  [name] [name] is a 58 y.o. very pleasant male patient who presents with the following:  He is here today for  DOT exam.  He has a few important history items to consider  He did have a cardiac stent placed in 2005; he was noted to have CAD on a cath done for CP.  His cardiologist is Dr. [name] at [place]. He does not believe he ever actually had an MI.  He had a stress test in November- he was told it looked fine.  Low risk scan according to report on Epic  His most recent A1c was 7.1 in December.  His PCP is Dr. [name] who also manages his DM No problems with CP or SOB.   He does check his glucose at home- he is generally running around 130 fasting.    He also was dx with a stroke in 2011.  This occurred at [facility] [place].  He was kept overnight and released.  He does not have any permanent deficit.  He states his only sx at that time was that he "could not sign my name, but I had worked 72 hours straight."  His wife took him to the hospital because he had trouble signing a credit card slip.  There he was apparently dx with a stroke in the left pons and aggrenox was started.  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  We do not have records from this incident.  He has not been seeing a neurologist.    Patient Active Problem List   Diagnosis Date Noted  . Type II or unspecified type diabetes mellitus with peripheral circulatory disorders, uncontrolled(250.72) 09/02/2013  . Screening for prostate cancer 05/26/2013  . Routine general medical examination at a health care facility 05/26/2013  . Encounter for long-term (current) use of other medications 07/07/2011  . CVA  04/02/2010  . HYPERTENSION 06/13/2009  . DIABETES MELLITUS, TYPE II 07/02/2007  . HYPERLIPIDEMIA 07/02/2007  . Coronary atherosclerosis of native coronary artery 07/02/2007  . NEOPLASM, MALIGNANT, TESTES, HX OF 07/02/2007    Past Medical History  Diagnosis Date  . Coronary atherosclerosis of native coronary artery     DES RCA 10/05  . Stroke     Left pons 6/11 - Aggrenox started  . Mixed hyperlipidemia   . Type 2 diabetes mellitus   . Cholelithiasis   . Testicular cancer 1978    Past Surgical History  Procedure Laterality Date  . Testicular tumor resection      Right side    History  Substance Use Topics  . Smoking status: Former Smoker -- 1.50 packs/day for 43 years    Types: Cigarettes    Quit date: 04/21/2010  . Smokeless tobacco: Never Used  . Alcohol Use: No    Family History  Problem Relation Age of Onset  . Diabetes type II Father   . Heart disease Father   . Diabetes Father     No Known Allergies  Medication list has been reviewed and updated.  Current Outpatient Prescriptions on File Prior to Visit  Medication Sig Dispense Refill  . bromocriptine (PARLODEL) 2.5 MG tablet  Take 0.5 tablets (1.25 mg total) by mouth at bedtime.  45 tablet  3  . Canagliflozin (INVOKANA) 300 MG TABS Take 1 tablet (300 mg total) by mouth daily.  90 tablet  3  . celecoxib (CELEBREX) 200 MG capsule Take 1 capsule (200 mg total) by mouth daily as needed.  90 capsule  3  . dipyridamole-aspirin (AGGRENOX) 200-25 MG per 12 hr capsule Take 1 capsule by mouth 2 (two) times daily.  180 capsule  3  . fish oil-omega-3 fatty acids 1000 MG capsule Take 1 capsule by mouth 2 (two) times daily.       Marland Kitchen glucose blood (ONETOUCH VERIO) test strip 1 each by Other route 2 (two) times daily. And lancets 2/day 250.01  180 each  3  . metFORMIN (GLUCOPHAGE) 500 MG tablet TAKE 1 TABLET TWICE A DAY  180 tablet  2  . nitroGLYCERIN (NITROSTAT) 0.4 MG SL tablet Place 1 tablet (0.4 mg total) under the  tongue every 5 (five) minutes as needed. May repeat every 5 minutes for up to 3 doses  25 tablet  6  . pioglitazone (ACTOS) 30 MG tablet Take 1 Tab daily  90 tablet  0  . simvastatin (ZOCOR) 40 MG tablet Take 1 tablet (40 mg total) by mouth at bedtime.  90 tablet  3  . sitaGLIPtin (JANUVIA) 100 MG tablet Take 1 tablet (100 mg total) by mouth daily.  90 tablet  0  . WELCHOL 625 MG tablet TAKE 3 TABLETS (1875 MG) TWICE A DAY WITH MEALS  540 tablet  1   No current facility-administered medications on file prior to visit.    Review of Systems:  As per HPI- otherwise negative.   Physical Examination: Filed Vitals:   11/24/13 1319  BP: 130/80  Pulse: 72  Temp: 97.1 F (36.2 C)  Resp: 18   Filed Vitals:   11/24/13 1319  Height: 5' 11.5" (1.816 m)  Weight: 270 lb (122.471 kg)   Body mass index is 37.14 kg/(m^2). Ideal Body Weight: Weight in (lb) to have BMI = 25: 181.4  GEN: WDWN, NAD, Non-toxic, A & O x 3, obese, looks well HEENT: Atraumatic, Normocephalic. Neck supple. No masses, No LAD.  Bilateral TM wnl, oropharynx normal.  PEERL,EOMI.   Ears and Nose: No external deformity. CV: RRR, No M/G/R. No JVD. No thrill. No extra heart sounds. PULM: CTA B, no wheezes, crackles, rhonchi. No retractions. No resp. distress. No accessory muscle use. ABD: S, NT, ND, +BS. No rebound. No HSM. EXTR: No c/c/e NEURO Normal gait.  He has normal strength and sensation in all extremities, normal DTR PSYCH: Normally interactive. Conversant. Not depressed or anxious appearing.  Calm demeanor.  No apparent inguinal hernia.    Results for orders placed in visit on 11/24/13  GLUCOSE, POCT (MANUAL RESULT ENTRY)      Result Value Ref Range   POC Glucose 92  70 - 99 mg/dl    Assessment and Plan: Physical exam, pre-employment  Diabetes - Plan: POCT glucose (manual entry)  CAD (coronary artery disease) of artery bypass graft  History of CVA (cerebrovascular accident)  DOT exam today.  He is UTD  on his cardiovascular monitoring, and his DM is controlled per his recent A1c and today's glucose.  Explained that his history of stroke actually does require more testing and investigation per [facility] guidelines.  He needs to see a neurologist and have a road test.  He is understandably frustrated as he has driven for over  30 years without any accident where he was at fault, and has no residual sx from his CVA.  He is not convinced that he really had a CVA in the first place.  Since it has been nearly 4 years since his CVA and his condition is stable will give him a 3 month card today.  He actually plans to retire in the next few weeks and does not plan to pursue a neurology eval/ road test.  If he changes his mind about this I am glad to help in any way.   Signed Lamar Blinks, MD

## 2013-11-24 NOTE — Progress Notes (Signed)
Sp gr-1.010 glucose 2000+ protein neg blood neg

## 2013-11-24 NOTE — Patient Instructions (Addendum)
I am going to qualify you for a 3 month card today- this is due to your history of stroke.  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  If you want to see a neurologist to determine if any furhter follow-up is necessary and if you truly had a stroke.  Let me know if you would like to see a neurologist and I would be glad to arrange this for you

## 2014-01-15 ENCOUNTER — Other Ambulatory Visit: Payer: Self-pay | Admitting: Endocrinology

## 2014-01-18 ENCOUNTER — Encounter: Payer: Self-pay | Admitting: Cardiology

## 2014-01-18 ENCOUNTER — Ambulatory Visit (INDEPENDENT_AMBULATORY_CARE_PROVIDER_SITE_OTHER): Payer: BC Managed Care – PPO | Admitting: Cardiology

## 2014-01-18 VITALS — BP 143/84 | HR 83 | Ht 72.0 in | Wt 270.8 lb

## 2014-01-18 DIAGNOSIS — I10 Essential (primary) hypertension: Secondary | ICD-10-CM

## 2014-01-18 DIAGNOSIS — E785 Hyperlipidemia, unspecified: Secondary | ICD-10-CM

## 2014-01-18 DIAGNOSIS — I2510 Atherosclerotic heart disease of native coronary artery without angina pectoris: Secondary | ICD-10-CM

## 2014-01-18 NOTE — Assessment & Plan Note (Signed)
Symptomatically stable with history of DES to the RCA in 2005. Followup Cardiolite in the last 6 months with low risk.

## 2014-01-18 NOTE — Progress Notes (Addendum)
Clinical Summary Mr. [name] [name] is a 58 y.o.male last seen in November 2014. He has been stable, no angina symptoms. 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 He states that he has logged nearly 5,000,000 miles over his career.  Followup exercise Cardiolite from November 2014 showed no diagnostic ST segment changes at maximum workload 10.4 METs, inferior wall attenuation artifact without scar or ischemia, LVEF 67%. We discussed this today.  Lipid panel from last September showed cholesterol 126, triglycerides 117, HDL 54, LDL 48.  He does not report any problems his medications.  No Known Allergies  Current Outpatient Prescriptions  Medication Sig Dispense Refill  . bromocriptine (PARLODEL) 2.5 MG tablet Take 0.5 tablets (1.25 mg total) by mouth at bedtime.  45 tablet  3  . Canagliflozin (INVOKANA) 300 MG TABS Take 1 tablet (300 mg total) by mouth daily.  90 tablet  3  . celecoxib (CELEBREX) 200 MG capsule Take 1 capsule (200 mg total) by mouth daily as needed.  90 capsule  3  . dipyridamole-aspirin (AGGRENOX) 200-25 MG per 12 hr capsule Take 1 capsule by mouth 2 (two) times daily.  180 capsule  3  . fish oil-omega-3 fatty acids 1000 MG capsule Take 1 capsule by mouth 2 (two) times daily.       . metFORMIN (GLUCOPHAGE) 500 MG tablet TAKE 1 TABLET TWICE A DAY  180 tablet  1  . Multiple Vitamin (MULTIVITAMIN) tablet Take 1 tablet by mouth daily.      . nitroGLYCERIN (NITROSTAT) 0.4 MG SL tablet Place 1 tablet (0.4 mg total) under the tongue every 5 (five) minutes as needed. May repeat every 5 minutes for up to 3 doses  25 tablet  6  . pioglitazone (ACTOS) 30 MG tablet TAKE 1 TABLET DAILY  90 tablet  1  . simvastatin (ZOCOR) 40 MG tablet Take 1 tablet (40 mg total) by mouth at bedtime.  90 tablet  3  . sitaGLIPtin (JANUVIA) 100 MG tablet Take 1 tablet (100 mg total) by mouth daily.  90 tablet  0  . WELCHOL 625 MG tablet TAKE 3 TABLETS (1875 MG) TWICE A DAY WITH MEALS  540 tablet  1     No current facility-administered medications for this visit.    Past Medical History  Diagnosis Date  . Coronary atherosclerosis of native coronary artery     DES RCA 10/05  . Stroke     Left pons 6/11 - Aggrenox started  . Mixed hyperlipidemia   . Type 2 diabetes mellitus   . Cholelithiasis   . Testicular cancer 1978    Social History Mr. Higham reports that he quit smoking about 3 years ago. His smoking use included Cigarettes. He has a 64.5 pack-year smoking history. He has never used smokeless tobacco. Mr. Craighead reports that he does not drink alcohol.  Review of Systems No palpitations or syncope. No focal motor weakness, visual changes, syncope. Otherwise negative.  Physical Examination Filed Vitals:   01/18/14 1458  BP: 143/84  Pulse: 83   Filed Weights   01/18/14 1458  Weight: 270 lb 12.8 oz (122.834 kg)    Obese male in no acute distress.  HEENT: Conjunctiva and lids normal, oropharynx clear.  Neck: Supple, no carotid bruits or thyromegaly.  Lungs: Clear to auscultation, nonlabored.  Cardiac: Regular rate and rhythm, no S3.  Abdomen: Obese, nontender, bowel sounds present.  Skin: Warm and dry.  Musculoskeletal: No kyphosis.  Extremity: No pitting edema, distal pulses 2+.  Neuropsychiatric: Alert and oriented x3, affect appropriate.    Problem List and Plan   Coronary atherosclerosis of native coronary artery Symptomatically stable with history of DES to the RCA in 2005. Followup Cardiolite in the last 6 months with low risk.  HYPERLIPIDEMIA Continues on Zocor and WelChol, LDL and triglycerides have been well controlled.  HYPERTENSION No change to current regimen.    Satira Sark, M.D., F.A.C.C.

## 2014-01-18 NOTE — Assessment & Plan Note (Signed)
Continues on Zocor and WelChol, LDL and triglycerides have been well controlled.

## 2014-01-18 NOTE — Patient Instructions (Signed)

## 2014-01-18 NOTE — Assessment & Plan Note (Signed)
No change to current regimen. 

## 2014-02-02 ENCOUNTER — Encounter: Payer: Self-pay | Admitting: *Deleted

## 2014-02-06 ENCOUNTER — Ambulatory Visit: Payer: BC Managed Care – PPO | Admitting: Cardiology

## 2014-02-21 ENCOUNTER — Ambulatory Visit: Payer: Self-pay | Admitting: Family Medicine

## 2014-02-21 VITALS — BP 126/84 | HR 83 | Temp 98.0°F | Resp 18 | Ht 72.0 in | Wt 268.0 lb

## 2014-02-21 DIAGNOSIS — Z0000 Encounter for general adult medical examination without abnormal findings: Secondary | ICD-10-CM

## 2014-02-21 DIAGNOSIS — R739 Hyperglycemia, unspecified: Secondary | ICD-10-CM

## 2014-02-21 DIAGNOSIS — Z8673 Personal history of transient ischemic attack (TIA), and cerebral infarction without residual deficits: Secondary | ICD-10-CM

## 2014-02-21 DIAGNOSIS — R7309 Other abnormal glucose: Secondary | ICD-10-CM

## 2014-02-21 LAB — GLUCOSE, POCT (MANUAL RESULT ENTRY): POC Glucose: 119 mg/dl — AB (ref 70–99)

## 2014-02-21 NOTE — Progress Notes (Signed)
[facility] [address], [place] [address] [phone]  Date:  02/21/2014   Name:  [name] [name]   DOB:  13-Jun-1956   MRN:  [id number]  PCP:  Renato Shin, MD    Chief Complaint: Annual Exam   History of Present Illness:  [name] [name] is a 58 y.o. 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  He was here in February; noted to have history of CAD/ stent in 2005, CVA 2011.  He was givne a 3 month card as he was not aware that he needed a neurology follow-up and road test.  He has recently been to see his neurologist in [place] and was cleared to return to commercial driving.  He also did do a road test on 02/16/2014 and did well.    Negative cardiolyte in 07/2013 A1c in December was 7.1%.    Patient Active Problem List   Diagnosis Date Noted  . Type II or unspecified type diabetes mellitus with peripheral circulatory disorders, uncontrolled(250.72) 09/02/2013  . Screening for prostate cancer 05/26/2013  . Routine general medical examination at a health care facility 05/26/2013  . Encounter for long-term (current) use of other medications 07/07/2011  . CVA 04/02/2010  . HYPERTENSION 06/13/2009  . DIABETES MELLITUS, TYPE II 07/02/2007  . HYPERLIPIDEMIA 07/02/2007  . Coronary atherosclerosis of native coronary artery 07/02/2007  . NEOPLASM, MALIGNANT, TESTES, HX OF 07/02/2007    Past Medical History  Diagnosis Date  . Coronary atherosclerosis of native coronary artery     DES RCA 10/05  . Stroke     Left pons 6/11 - Aggrenox started  . Other and unspecified hyperlipidemia   . Type 2 diabetes mellitus   . Cholelithiasis   . Testicular cancer 1978  . Unspecified essential hypertension   . Personal history of malignant neoplasm of testis 1978  . OA (osteoarthritis)   . Urolithiasis     Past Surgical History  Procedure Laterality Date  . Testicular tumor resection      Right side  .  Coronary stent placement  2005    PCTA, drug eluting stent  . Left heart cath      History  Substance Use Topics  . Smoking status: Former Smoker -- 1.50 packs/day for 43 years    Types: Cigarettes    Quit date: 04/21/2010  . Smokeless tobacco: Never Used  . Alcohol Use: No    Family History  Problem Relation Age of Onset  . Heart disease Father   . Diabetes Father   . Heart attack Father     No Known Allergies  Medication list has been reviewed and updated.  Current Outpatient Prescriptions on File Prior to Visit  Medication Sig Dispense Refill  . bromocriptine (PARLODEL) 2.5 MG tablet Take 0.5 tablets (1.25 mg total) by mouth at bedtime.  45 tablet  3  . dipyridamole-aspirin (AGGRENOX) 200-25 MG per 12 hr capsule Take 1 capsule by mouth 2 (two) times daily.  180 capsule  3  . fish oil-omega-3 fatty acids 1000 MG capsule Take 1 capsule by mouth 2 (two) times daily.       . metFORMIN (GLUCOPHAGE) 500 MG tablet TAKE 1 TABLET TWICE A DAY  180 tablet  1  . Multiple Vitamin (MULTIVITAMIN) tablet Take 1 tablet by mouth daily.      . nitroGLYCERIN (NITROSTAT) 0.4 MG SL tablet Place 1 tablet (0.4 mg total) under the  tongue every 5 (five) minutes as needed. May repeat every 5 minutes for up to 3 doses  25 tablet  6  . pioglitazone (ACTOS) 30 MG tablet TAKE 1 TABLET DAILY  90 tablet  1  . simvastatin (ZOCOR) 40 MG tablet Take 1 tablet (40 mg total) by mouth at bedtime.  90 tablet  3  . sitaGLIPtin (JANUVIA) 100 MG tablet Take 1 tablet (100 mg total) by mouth daily.  90 tablet  0  . WELCHOL 625 MG tablet TAKE 3 TABLETS (1875 MG) TWICE A DAY WITH MEALS  540 tablet  1   No current facility-administered medications on file prior to visit.    Review of Systems:  As per HPI- otherwise negative.   Physical Examination: Filed Vitals:   02/21/14 0951  BP: 126/84  Pulse: 83  Temp: 98 F (36.7 C)  Resp: 18   Filed Vitals:   02/21/14 0951  Height: 6' (1.829 m)  Weight: 268 lb  (121.564 kg)   Body mass index is 36.34 kg/(m^2). Ideal Body Weight: Weight in (lb) to have BMI = 25: 183.9  GEN: WDWN, NAD, Non-toxic, A & O x 3, large build HEENT: Atraumatic, Normocephalic. Neck supple. No masses, No LAD. Ears and Nose: No external deformity. CV: RRR, No M/G/R. No JVD. No thrill. No extra heart sounds. PULM: CTA B, no wheezes, crackles, rhonchi. No retractions. No resp. distress. No accessory muscle use. ABD: S, NT, ND. No rebound. No HSM.  EXTR: No c/c/e  Normal strength, ROM and DTR all extremities NEURO Normal gait.  PSYCH: Normally interactive. Conversant. Not depressed or anxious appearing.  Calm demeanor.    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   BP under control.   One year card  Signed Lamar Blinks, MD

## 2014-07-04 ENCOUNTER — Other Ambulatory Visit: Payer: Self-pay | Admitting: Endocrinology

## 2014-07-04 NOTE — Telephone Encounter (Signed)
Please refill x 1 cpx is due 

## 2014-07-11 ENCOUNTER — Ambulatory Visit: Payer: BC Managed Care – PPO | Admitting: Cardiology

## 2014-07-13 ENCOUNTER — Encounter: Payer: Self-pay | Admitting: Cardiology

## 2014-07-13 ENCOUNTER — Ambulatory Visit (INDEPENDENT_AMBULATORY_CARE_PROVIDER_SITE_OTHER): Payer: BC Managed Care – PPO | Admitting: Cardiology

## 2014-07-13 VITALS — BP 145/75 | HR 70 | Ht 72.0 in | Wt 275.2 lb

## 2014-07-13 DIAGNOSIS — I10 Essential (primary) hypertension: Secondary | ICD-10-CM

## 2014-07-13 DIAGNOSIS — E782 Mixed hyperlipidemia: Secondary | ICD-10-CM

## 2014-07-13 DIAGNOSIS — I2510 Atherosclerotic heart disease of native coronary artery without angina pectoris: Secondary | ICD-10-CM

## 2014-07-13 NOTE — Assessment & Plan Note (Signed)
Symptomatically stable on medical therapy. Followup Cardiolite from a year ago was overall low risk. Continue observation.

## 2014-07-13 NOTE — Assessment & Plan Note (Signed)
Blood pressure mildly elevated today. No changes made to current regimen. ACE inhibitor would certainly be a consideration with his diabetes mellitus. Keep followup with Dr. [name].

## 2014-07-13 NOTE — Progress Notes (Signed)
Clinical Summary [name] [name] is a 58 y.o.male last seen in April. He continues to do well, no angina symptoms. 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 He continues to enjoy working on cars and doing restorations.  Followup exercise Cardiolite from November 2014 showed no diagnostic ST segment changes at maximum workload 10.4 METs, inferior wall attenuation artifact without scar or ischemia, LVEF 67%.   Lipid panel from last year showed LDL 48. He continues to follow with Dr. [name].  No Known Allergies  Current Outpatient Prescriptions  Medication Sig Dispense Refill  . bromocriptine (PARLODEL) 2.5 MG tablet Take 0.5 tablets (1.25 mg total) by mouth at bedtime.  45 tablet  3  . dipyridamole-aspirin (AGGRENOX) 200-25 MG per 12 hr capsule TAKE 1 CAPSULE TWICE A DAY  180 capsule  0  . fish oil-omega-3 fatty acids 1000 MG capsule Take 1 capsule by mouth 2 (two) times daily.       Marland Kitchen JANUVIA 100 MG tablet TAKE 1 TABLET DAILY  90 tablet  0  . metFORMIN (GLUCOPHAGE) 500 MG tablet TAKE 1 TABLET TWICE A DAY  180 tablet  0  . Multiple Vitamin (MULTIVITAMIN) tablet Take 1 tablet by mouth daily.      . nitroGLYCERIN (NITROSTAT) 0.4 MG SL tablet Place 1 tablet (0.4 mg total) under the tongue every 5 (five) minutes as needed. May repeat every 5 minutes for up to 3 doses  25 tablet  6  . pioglitazone (ACTOS) 30 MG tablet TAKE 1 TABLET DAILY  90 tablet  0  . simvastatin (ZOCOR) 40 MG tablet Take 1 tablet (40 mg total) by mouth at bedtime.  90 tablet  3  . WELCHOL 625 MG tablet TAKE 3 TABLETS (1875 MG) TWICE A DAY WITH MEALS  540 tablet  1  . sitaGLIPtin (JANUVIA) 100 MG tablet Take 1 tablet (100 mg total) by mouth daily.  90 tablet  0   No current facility-administered medications for this visit.    Past Medical History  Diagnosis Date  . Coronary atherosclerosis of native coronary artery     DES RCA 10/05  . Stroke     Left pons 6/11 - Aggrenox started  . Other  and unspecified hyperlipidemia   . Type 2 diabetes mellitus   . Cholelithiasis   . Testicular cancer 1978  . Unspecified essential hypertension   . Personal history of malignant neoplasm of testis 1978  . OA (osteoarthritis)   . Urolithiasis     Social History Mr. Buenger reports that he quit smoking about 4 years ago. His smoking use included Cigarettes. He has a 64.5 pack-year smoking history. He has never used smokeless tobacco. Mr. Yon reports that he drinks alcohol.  Review of Systems No palpitations or syncope. No claudication. No orthopnea or PND. Other systems reviewed and negative except as outlined.  Physical Examination Filed Vitals:   07/13/14 1518  BP: 145/75  Pulse: 70   Filed Weights   07/13/14 1514  Weight: 275 lb 4 oz (124.853 kg)    Obese male in no acute distress.  HEENT: Conjunctiva and lids normal, oropharynx clear.  Neck: Supple, no carotid bruits or thyromegaly.  Lungs: Clear to auscultation, nonlabored.  Cardiac: Regular rate and rhythm, no S3.  Abdomen: Obese, nontender, bowel sounds present.  Skin: Warm and dry.  Musculoskeletal: No kyphosis.  Extremity: No pitting edema, distal pulses 2+.  Neuropsychiatric: Alert and oriented x3, affect appropriate.    Problem  List and Plan   Coronary atherosclerosis of native coronary artery Symptomatically stable on medical therapy. Followup Cardiolite from a year ago was overall low risk. Continue observation.  Mixed hyperlipidemia Continues on Zocor, followed by Dr. [name].  Essential hypertension Blood pressure mildly elevated today. No changes made to current regimen. ACE inhibitor would certainly be a consideration with his diabetes mellitus. Keep followup with Dr. [name].    Satira Sark, M.D., F.A.C.C.

## 2014-07-13 NOTE — Patient Instructions (Signed)
Continue all current medications. Your physician wants you to follow up in: 6 months.  You will receive a reminder letter in the mail one-two months in advance.  If you don't receive a letter, please call our office to schedule the follow up appointment   

## 2014-07-13 NOTE — Assessment & Plan Note (Signed)
Continues on Zocor, followed by Dr. [name].

## 2014-07-14 ENCOUNTER — Other Ambulatory Visit: Payer: Self-pay | Admitting: Endocrinology

## 2014-07-17 NOTE — Telephone Encounter (Signed)
Please advise if ok to refill. I could not locate on current medication list.  Thanks!

## 2014-07-17 NOTE — Telephone Encounter (Signed)
Please refill x 1 cpx is due 

## 2014-08-26 ENCOUNTER — Other Ambulatory Visit: Payer: Self-pay | Admitting: Endocrinology

## 2014-08-27 NOTE — Telephone Encounter (Signed)
Rx sent to [facility].

## 2014-08-27 NOTE — Telephone Encounter (Signed)
Please advise if ok to refill rx. Pt was last seen 09/01/2013. Thanks!

## 2014-08-27 NOTE — Telephone Encounter (Signed)
Please refill x 1 cpx is due (must be on or after 09/01/14)

## 2014-10-09 ENCOUNTER — Other Ambulatory Visit: Payer: Self-pay | Admitting: *Deleted

## 2014-10-09 MED ORDER — SIMVASTATIN 40 MG PO TABS: 40.0000 mg | ORAL_TABLET | Freq: Every day | ORAL | Status: DC

## 2014-10-29 ENCOUNTER — Telehealth: Payer: Self-pay | Admitting: Endocrinology

## 2014-10-29 MED ORDER — SITAGLIPTIN PHOSPHATE 100 MG PO TABS: 100.0000 mg | ORAL_TABLET | Freq: Every day | ORAL | Status: DC

## 2014-10-29 MED ORDER — METFORMIN HCL 500 MG PO TABS: 500.0000 mg | ORAL_TABLET | Freq: Two times a day (BID) | ORAL | Status: DC

## 2014-10-29 MED ORDER — CANAGLIFLOZIN 300 MG PO TABS: 300.0000 mg | ORAL_TABLET | Freq: Every day | ORAL | Status: DC

## 2014-10-29 MED ORDER — BROMOCRIPTINE MESYLATE 2.5 MG PO TABS: ORAL_TABLET | ORAL | Status: DC

## 2014-10-29 MED ORDER — PIOGLITAZONE HCL 30 MG PO TABS: 30.0000 mg | ORAL_TABLET | Freq: Every day | ORAL | Status: DC

## 2014-10-29 MED ORDER — COLESEVELAM HCL 625 MG PO TABS: ORAL_TABLET | ORAL | Status: DC

## 2014-10-29 NOTE — Telephone Encounter (Signed)
Rx for 30 day sent per pt's request. 90 day supply denied pt needs appointment for further refills. Unable to reach pt to discuss.

## 2014-10-29 NOTE — Telephone Encounter (Signed)
Pt needs refills of all meds sent to [facility] please asap he will be out soon.  Also pelase call in a 30 day supply to his [facility] [place] to hold him until the mail order comes

## 2014-10-31 ENCOUNTER — Telehealth: Payer: Self-pay | Admitting: Endocrinology

## 2014-10-31 NOTE — Telephone Encounter (Signed)
Patients wife called stating that Mr. Hammerschmidt has been having an issue with the mail order pharmacy   Patient would like to speak with Megan     Please advise   Thank you

## 2014-10-31 NOTE — Telephone Encounter (Signed)
Requested call back from pt to discuss.

## 2014-11-01 NOTE — Telephone Encounter (Addendum)
Lvom advising pt and pt's wife Pt needs a appointment before the medication can be approved for a 90 day supply with his mail order. Requested call back from pt to schedule appointment.

## 2014-11-02 ENCOUNTER — Ambulatory Visit (INDEPENDENT_AMBULATORY_CARE_PROVIDER_SITE_OTHER): Payer: BLUE CROSS/BLUE SHIELD | Admitting: Endocrinology

## 2014-11-02 ENCOUNTER — Encounter: Payer: Self-pay | Admitting: Endocrinology

## 2014-11-02 VITALS — BP 134/82 | HR 74 | Temp 98.2°F | Ht 72.0 in | Wt 274.0 lb

## 2014-11-02 DIAGNOSIS — E782 Mixed hyperlipidemia: Secondary | ICD-10-CM

## 2014-11-02 DIAGNOSIS — Z125 Encounter for screening for malignant neoplasm of prostate: Secondary | ICD-10-CM | POA: Insufficient documentation

## 2014-11-02 DIAGNOSIS — E119 Type 2 diabetes mellitus without complications: Secondary | ICD-10-CM

## 2014-11-02 DIAGNOSIS — Z0000 Encounter for general adult medical examination without abnormal findings: Secondary | ICD-10-CM | POA: Insufficient documentation

## 2014-11-02 LAB — CBC WITH DIFFERENTIAL/PLATELET
BASOS ABS: 0 10*3/uL (ref 0.0–0.1)
Basophils Relative: 0.2 % (ref 0.0–3.0)
EOS ABS: 0.1 10*3/uL (ref 0.0–0.7)
EOS PCT: 1.5 % (ref 0.0–5.0)
HCT: 45.1 % (ref 39.0–52.0)
HEMOGLOBIN: 15.4 g/dL (ref 13.0–17.0)
LYMPHS PCT: 33 % (ref 12.0–46.0)
Lymphs Abs: 2.6 10*3/uL (ref 0.7–4.0)
MCHC: 34.3 g/dL (ref 30.0–36.0)
MCV: 89.4 fl (ref 78.0–100.0)
Monocytes Absolute: 0.5 10*3/uL (ref 0.1–1.0)
Monocytes Relative: 6.6 % (ref 3.0–12.0)
Neutro Abs: 4.6 10*3/uL (ref 1.4–7.7)
Neutrophils Relative %: 58.7 % (ref 43.0–77.0)
Platelets: 184 10*3/uL (ref 150.0–400.0)
RBC: 5.04 Mil/uL (ref 4.22–5.81)
RDW: 12.9 % (ref 11.5–15.5)
WBC: 7.9 10*3/uL (ref 4.0–10.5)

## 2014-11-02 LAB — LIPID PANEL
Cholesterol: 147 mg/dL (ref 0–200)
HDL: 54.1 mg/dL (ref >39.00)
LDL Cholesterol: 56 mg/dL (ref 0–99)
NONHDL: 92.9
Total CHOL/HDL Ratio: 3
Triglycerides: 183 mg/dL — ABNORMAL HIGH (ref 0.0–149.0)
VLDL: 36.6 mg/dL (ref 0.0–40.0)

## 2014-11-02 LAB — BASIC METABOLIC PANEL
BUN: 18 mg/dL (ref 6–23)
CHLORIDE: 103 meq/L (ref 96–112)
CO2: 28 meq/L (ref 19–32)
Calcium: 10 mg/dL (ref 8.4–10.5)
Creatinine, Ser: 1.07 mg/dL (ref 0.40–1.50)
GFR: 74.83 mL/min (ref >60.00)
Glucose, Bld: 101 mg/dL — ABNORMAL HIGH (ref 70–99)
POTASSIUM: 4.8 meq/L (ref 3.5–5.1)
SODIUM: 137 meq/L (ref 135–145)

## 2014-11-02 LAB — HEPATIC FUNCTION PANEL
ALT: 42 U/L (ref 0–53)
AST: 30 U/L (ref 0–37)
Albumin: 4.2 g/dL (ref 3.5–5.2)
Alkaline Phosphatase: 53 U/L (ref 39–117)
Bilirubin, Direct: 0.1 mg/dL (ref 0.0–0.3)
Total Bilirubin: 0.6 mg/dL (ref 0.2–1.2)
Total Protein: 6.9 g/dL (ref 6.0–8.3)

## 2014-11-02 LAB — PSA: PSA: 0.29 ng/mL (ref 0.10–4.00)

## 2014-11-02 LAB — MICROALBUMIN / CREATININE URINE RATIO
Creatinine,U: 58.3 mg/dL
Microalb Creat Ratio: 1.2 mg/g (ref 0.0–30.0)
Microalb, Ur: <0.7 mg/dL (ref 0.0–1.9)

## 2014-11-02 LAB — URINALYSIS, ROUTINE W REFLEX MICROSCOPIC
Bilirubin Urine: NEGATIVE
HGB URINE DIPSTICK: NEGATIVE
KETONES UR: NEGATIVE
Leukocytes, UA: NEGATIVE
NITRITE: NEGATIVE
Specific Gravity, Urine: 1.015 (ref 1.000–1.030)
TOTAL PROTEIN, URINE-UPE24: NEGATIVE
UROBILINOGEN UA: 0.2 (ref 0.0–1.0)
pH: 5.5 (ref 5.0–8.0)

## 2014-11-02 LAB — TSH: TSH: 1.35 u[IU]/mL (ref 0.35–4.50)

## 2014-11-02 LAB — HEMOGLOBIN A1C: Hgb A1c MFr Bld: 6.7 % — ABNORMAL HIGH (ref 4.6–6.5)

## 2014-11-02 MED ORDER — LINAGLIPTIN 5 MG PO TABS: 5.0000 mg | ORAL_TABLET | Freq: Every day | ORAL | Status: DC

## 2014-11-02 MED ORDER — SITAGLIPTIN PHOSPHATE 100 MG PO TABS: 100.0000 mg | ORAL_TABLET | Freq: Every day | ORAL | Status: DC

## 2014-11-02 NOTE — Patient Instructions (Signed)
blood tests are being requested for you today.  We'll contact you with results.  check your blood sugar once a day.  vary the time of day when you check, between before the 3 meals, and at bedtime.  also check if you have symptoms of your blood sugar being too high or too low.  please keep a record of the readings and bring it to your next appointment here.  You can write it on any piece of paper.  please call us sooner if your blood sugar goes below 70, or if you have a lot of readings over 200. Please come back for a regular physical appointment in 3 months.

## 2014-11-02 NOTE — Progress Notes (Signed)
Subjective:    Patient ID: [name] [name], male    DOB: 1956/07/21, 59 y.o.   MRN: [id number]  HPI  Pt returns for f/u of diabetes mellitus: DM type: 2 Dx'ed: 7342 Complications: CAD and CVA Therapy: 6 oral meds DKA: never Severe hypoglycemia: never Pancreatitis: never Other:  Interval history: no cbg record, but states cbg's are well-controlled.  pt states he feels well in general. 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   Past Medical History  Diagnosis Date  . Coronary atherosclerosis of native coronary artery     DES RCA 10/05  . Stroke     Left pons 6/11 - Aggrenox started  . Other and unspecified hyperlipidemia   . Type 2 diabetes mellitus   . Cholelithiasis   . Testicular cancer 1978  . Unspecified essential hypertension   . Personal history of malignant neoplasm of testis 1978  . OA (osteoarthritis)   . Urolithiasis     Past Surgical History  Procedure Laterality Date  . Testicular tumor resection      Right side  . Coronary stent placement  2005    PCTA, drug eluting stent  . Left heart cath      History   Social History  . Marital Status: Married    Spouse Name: N/A    Number of Children: 0  . Years of Education: N/A   Occupational History  . Truck driver    Social History Main Topics  . Smoking status: Former Smoker -- 1.50 packs/day for 43 years    Types: Cigarettes    Quit date: 04/21/2010  . Smokeless tobacco: Never Used  . Alcohol Use: Yes     Comment: "Not much.. 6 packs a year"  . Drug Use: No  . Sexual Activity: Not on file   Other Topics Concern  . Not on file   Social History Narrative    Current Outpatient Prescriptions on File Prior to Visit  Medication Sig Dispense Refill  . bromocriptine (PARLODEL) 2.5 MG tablet TAKE ONE-HALF (1/2) TABLET (1.25 MG TOTAL) AT BEDTIME *APPOINTMENT NEEDED FOR FURTHER REFILLS* 15 tablet 0  . canagliflozin (INVOKANA) 300 MG TABS tablet Take 300 mg by mouth daily. APPOINTMENT NEEDED FOR FURTHER  REFILLS 30 tablet 0  . colesevelam (WELCHOL) 625 MG tablet TAKE 3 TABLETS (1875 MG) TWICE A DAY WITH MEALS. *APPOINTMENT NEEDED FOR FURTHER REFILLS* 180 tablet 1  . dipyridamole-aspirin (AGGRENOX) 200-25 MG per 12 hr capsule TAKE 1 CAPSULE TWICE A DAY 180 capsule 0  . fish oil-omega-3 fatty acids 1000 MG capsule Take 1 capsule by mouth 2 (two) times daily.     . metFORMIN (GLUCOPHAGE) 500 MG tablet Take 1 tablet (500 mg total) by mouth 2 (two) times daily. *APPOINTMENT NEED FOR FURTHER REFILLS* 60 tablet 0  . Multiple Vitamin (MULTIVITAMIN) tablet Take 1 tablet by mouth daily.    . nitroGLYCERIN (NITROSTAT) 0.4 MG SL tablet Place 1 tablet (0.4 mg total) under the tongue every 5 (five) minutes as needed. May repeat every 5 minutes for up to 3 doses 25 tablet 6  . pioglitazone (ACTOS) 30 MG tablet Take 1 tablet (30 mg total) by mouth daily. *APPOINTMENT NEEDED FOR FURTHER REFILLS. 30 tablet 0  . simvastatin (ZOCOR) 40 MG tablet Take 1 tablet (40 mg total) by mouth at bedtime. 90 tablet 3   No current facility-administered medications on file prior to visit.    No Known Allergies  Family History  Problem Relation Age of Onset  .  Heart disease Father   . Diabetes Father   . Heart attack Father     BP 134/82 mmHg  Pulse 74  Temp(Src) 98.2 F (36.8 C) (Oral)  Ht 6' (1.829 m)  Wt 274 lb (124.286 kg)  BMI 37.15 kg/m2  SpO2 92%  Review of Systems He denies hypoglycemia and weight change    Objective:   Physical Exam VITAL SIGNS:  See vs page GENERAL: no distress Pulses: dorsalis pedis intact bilat.   MSK: no deformity of the feet CV: trace bilat leg edema.   Skin:  no ulcer on the feet.  normal color and temp on the feet. Neuro: sensation is intact to touch on the feet.   Lab Results  Component Value Date   WBC 7.9 11/02/2014   HGB 15.4 11/02/2014   HCT 45.1 11/02/2014   PLT 184.0 11/02/2014   GLUCOSE 101* 11/02/2014   CHOL 147 11/02/2014   TRIG 183.0* 11/02/2014   HDL  54.10 11/02/2014   LDLCALC 56 11/02/2014   ALT 42 11/02/2014   AST 30 11/02/2014   NA 137 11/02/2014   K 4.8 11/02/2014   CL 103 11/02/2014   CREATININE 1.07 11/02/2014   BUN 18 11/02/2014   CO2 28 11/02/2014   TSH 1.35 11/02/2014   PSA 0.29 11/02/2014   HGBA1C 6.7* 11/02/2014   MICROALBUR <0.7 11/02/2014      Assessment & Plan:  DM: overcontrolled, given this sulfonylurea-containing regimen. Dyslipidemia: well-controlled HTN: well-controlled   Patient is advised the following: Patient Instructions  blood tests are being requested for you today.  We'll contact you with results.  check your blood sugar once a day.  vary the time of day when you check, between before the 3 meals, and at bedtime.  also check if you have symptoms of your blood sugar being too high or too low.  please keep a record of the readings and bring it to your next appointment here.  You can write it on any piece of paper.  please call us sooner if your blood sugar goes below 70, or if you have a lot of readings over 200. Please come back for a regular physical appointment in 3 months.

## 2014-11-06 ENCOUNTER — Other Ambulatory Visit: Payer: Self-pay | Admitting: Endocrinology

## 2014-11-07 ENCOUNTER — Other Ambulatory Visit: Payer: Self-pay

## 2014-11-07 MED ORDER — COLESEVELAM HCL 625 MG PO TABS: ORAL_TABLET | ORAL | Status: DC

## 2014-11-07 MED ORDER — BROMOCRIPTINE MESYLATE 2.5 MG PO TABS: ORAL_TABLET | ORAL | Status: DC

## 2014-11-07 MED ORDER — PIOGLITAZONE HCL 30 MG PO TABS: 30.0000 mg | ORAL_TABLET | Freq: Every day | ORAL | Status: DC

## 2014-11-07 MED ORDER — CANAGLIFLOZIN 300 MG PO TABS: 300.0000 mg | ORAL_TABLET | Freq: Every day | ORAL | Status: DC

## 2014-11-07 MED ORDER — METFORMIN HCL 500 MG PO TABS: 500.0000 mg | ORAL_TABLET | Freq: Two times a day (BID) | ORAL | Status: DC

## 2014-11-07 MED ORDER — ASPIRIN-DIPYRIDAMOLE ER 25-200 MG PO CP12: 1.0000 | ORAL_CAPSULE | Freq: Two times a day (BID) | ORAL | Status: DC

## 2014-11-09 ENCOUNTER — Telehealth: Payer: Self-pay | Admitting: Endocrinology

## 2014-11-09 MED ORDER — METFORMIN HCL 500 MG PO TABS
500.0000 mg | ORAL_TABLET | Freq: Two times a day (BID) | ORAL | Status: DC
Start: 2014-11-09 — End: 2015-05-14

## 2014-11-09 MED ORDER — PIOGLITAZONE HCL 30 MG PO TABS: 30.0000 mg | ORAL_TABLET | Freq: Every day | ORAL | Status: DC

## 2014-11-09 MED ORDER — BROMOCRIPTINE MESYLATE 2.5 MG PO TABS: ORAL_TABLET | ORAL | Status: DC

## 2014-11-09 MED ORDER — LINAGLIPTIN 5 MG PO TABS: 5.0000 mg | ORAL_TABLET | Freq: Every day | ORAL | Status: DC

## 2014-11-09 MED ORDER — COLESEVELAM HCL 625 MG PO TABS: ORAL_TABLET | ORAL | Status: DC

## 2014-11-09 MED ORDER — CANAGLIFLOZIN 300 MG PO TABS: 300.0000 mg | ORAL_TABLET | Freq: Every day | ORAL | Status: DC

## 2014-11-09 MED ORDER — ASPIRIN-DIPYRIDAMOLE ER 25-200 MG PO CP12: 1.0000 | ORAL_CAPSULE | Freq: Two times a day (BID) | ORAL | Status: DC

## 2014-11-09 NOTE — Telephone Encounter (Signed)
Pt's wife called wanting to verify if the pt is to starting taking Tradjenta 5mg . Medication was sent on 11/02/2014 after ov visit. Pt states he does not remember being instructed to being taking the medication.  Please advise, Thanks!

## 2014-11-09 NOTE — Telephone Encounter (Signed)
Pt's wife advised of note below and voiced understanding.  

## 2014-11-09 NOTE — Telephone Encounter (Signed)
The tradjenta replaces [place] (they are similar meds, but tradjenta is cheaper)

## 2014-11-09 NOTE — Telephone Encounter (Signed)
Patient would like for you to call her °

## 2014-11-12 ENCOUNTER — Telehealth: Payer: Self-pay

## 2014-11-12 NOTE — Telephone Encounter (Signed)
Error

## 2014-11-13 ENCOUNTER — Telehealth: Payer: Self-pay

## 2014-11-13 NOTE — Telephone Encounter (Signed)
Pt's wife called stating the Tradjenta copayment is too expensive. Pt is going to go back on the januiva due to the lower price.  Wanted MD to be advised.

## 2014-12-18 ENCOUNTER — Other Ambulatory Visit: Payer: Self-pay | Admitting: Endocrinology

## 2015-01-04 ENCOUNTER — Encounter: Payer: Self-pay | Admitting: Cardiology

## 2015-01-04 ENCOUNTER — Ambulatory Visit (INDEPENDENT_AMBULATORY_CARE_PROVIDER_SITE_OTHER): Payer: BLUE CROSS/BLUE SHIELD | Admitting: Cardiology

## 2015-01-04 VITALS — BP 138/84 | HR 69 | Ht 72.0 in | Wt 273.0 lb

## 2015-01-04 DIAGNOSIS — I10 Essential (primary) hypertension: Secondary | ICD-10-CM

## 2015-01-04 DIAGNOSIS — E782 Mixed hyperlipidemia: Secondary | ICD-10-CM | POA: Diagnosis not present

## 2015-01-04 DIAGNOSIS — I2510 Atherosclerotic heart disease of native coronary artery without angina pectoris: Secondary | ICD-10-CM | POA: Diagnosis not present

## 2015-01-04 NOTE — Progress Notes (Signed)
Cardiology Office Note  Date: 01/04/2015   ID: [name] [name], DOB Sep 01, 1956, MRN [id number]  PCP: Renato Shin, MD  Primary Cardiologist: [name], MD   Chief Complaint  Patient presents with  . Coronary Artery Disease  . Hyperlipidemia    History of Present Illness: [name] [name] is a 59 y.o. male last seen in October 2015. He presents for a routine follow-up visit. 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 He just had a DOT physical, and his last stress test was within the last 2 years. He does not report any angina symptoms or unusual shortness of breath with activity. Follow-up tracing is normal and outlined below.  I reviewed his lab work from February showing good lipid control. He continues on Zocor and WelChol.   Past Medical History  Diagnosis Date  . Coronary atherosclerosis of native coronary artery     DES RCA 10/05  . Stroke     Left pons 6/11 - Aggrenox started  . Hyperlipidemia   . Type 2 diabetes mellitus   . Cholelithiasis   . Testicular cancer 1978  . Essential hypertension   . OA (osteoarthritis)   . Urolithiasis      Current Outpatient Prescriptions  Medication Sig Dispense Refill  . bromocriptine (PARLODEL) 2.5 MG tablet TAKE ONE-HALF (1/2) TABLET (1.25 MG TOTAL) AT BEDTIME 45 tablet 2  . canagliflozin (INVOKANA) 300 MG TABS tablet Take 300 mg by mouth daily. 90 tablet 2  . colesevelam (WELCHOL) 625 MG tablet TAKE 3 TABLETS (1875 MG) TWICE A DAY WITH MEALS. 270 tablet 2  . dipyridamole-aspirin (AGGRENOX) 200-25 MG per 12 hr capsule Take 1 capsule by mouth 2 (two) times daily. 180 capsule 2  . fish oil-omega-3 fatty acids 1000 MG capsule Take 1 capsule by mouth 2 (two) times daily.     Marland Kitchen JANUVIA 100 MG tablet TAKE 1 TABLET BY MOUTH DAILY 30 tablet 2  . metFORMIN (GLUCOPHAGE) 500 MG tablet Take 1 tablet (500 mg total) by mouth 2 (two) times daily. 180 tablet 2  . Multiple Vitamin (MULTIVITAMIN) tablet Take  1 tablet by mouth daily.    . nitroGLYCERIN (NITROSTAT) 0.4 MG SL tablet Place 1 tablet (0.4 mg total) under the tongue every 5 (five) minutes as needed. May repeat every 5 minutes for up to 3 doses 25 tablet 6  . pioglitazone (ACTOS) 30 MG tablet Take 1 tablet (30 mg total) by mouth daily. 90 tablet 2  . simvastatin (ZOCOR) 40 MG tablet Take 1 tablet (40 mg total) by mouth at bedtime. 90 tablet 3   No current facility-administered medications for this visit.    Allergies:  Review of patient's allergies indicates no known allergies.   Social History: The patient  reports that he quit smoking about 4 years ago. His smoking use included Cigarettes. He has a 64.5 pack-year smoking history. He has never used smokeless tobacco. He reports that he drinks alcohol. He reports that he does not use illicit drugs.   ROS:  Please see the history of present illness. Otherwise, complete review of systems is positive for some trouble with tendinitis right arm.  All other systems are reviewed and negative.   Physical Exam: VS:  BP 138/84 mmHg  Pulse 69  Ht 6' (1.829 m)  Wt 273 lb (123.832 kg)  BMI 37.02 kg/m2  SpO2 95%, BMI Body mass index is 37.02 kg/(m^2).  Wt Readings from Last 3 Encounters:  01/04/15  273 lb (123.832 kg)  11/02/14 274 lb (124.286 kg)  07/13/14 275 lb 4 oz (124.853 kg)     Obese male in no acute distress.  HEENT: Conjunctiva and lids normal, oropharynx clear.  Neck: Supple, no carotid bruits or thyromegaly.  Lungs: Clear to auscultation, nonlabored.  Cardiac: Regular rate and rhythm, no S3.  Abdomen: Obese, nontender, bowel sounds present.  Skin: Warm and dry.  Musculoskeletal: No kyphosis.  Extremity: No pitting edema, distal pulses 2+.  Neuropsychiatric: Alert and oriented x3, affect appropriate.    ECG: ECG is ordered today and reviewed showing normal sinus rhythm.   Recent Labwork: 11/02/2014: ALT 42; AST 30; BUN 18; Creatinine 1.07; Hemoglobin 15.4;  Platelets 184.0; Potassium 4.8; Sodium 137; TSH 1.35     Component Value Date/Time   CHOL 147 11/02/2014 1424   TRIG 183.0* 11/02/2014 1424   TRIG 96 07/09/2006 1049   HDL 54.10 11/02/2014 1424   CHOLHDL 3 11/02/2014 1424   CHOLHDL 3.1 CALC 07/09/2006 1049   VLDL 36.6 11/02/2014 1424   LDLCALC 56 11/02/2014 1424    Other Studies Reviewed Today:  Followup exercise Cardiolite from November 2014 showed no diagnostic ST segment changes at maximum workload 10.4 METs, inferior wall attenuation artifact without scar or ischemia, LVEF 67%.   Assessment and Plan:  1. CAD status post DES to the RCA in 2005, low risk ischemic testing most recently in November 2014. ECG is normal. He has been symptomatically stable without angina on medical therapy. We will continue observation.  2. Hyperlipidemia, recent LDL 56.  3. Type 2 diabetes mellitus, keep follow-up with Dr. [name].  Current medicines were reviewed with the patient today.   Orders Placed This Encounter  Procedures  . EKG 12-Lead    Disposition: FU with me in 6 months.   Signed, Satira Sark, MD, [facility] 01/04/2015 9:09 AM    [facility] [place], [place], [place] [address] Phone: [phone]; Fax: [phone]

## 2015-01-04 NOTE — Patient Instructions (Signed)
Continue all current medications. Your physician wants you to follow up in: 6 months.  You will receive a reminder letter in the mail one-two months in advance.  If you don't receive a letter, please call our office to schedule the follow up appointment   

## 2015-01-30 ENCOUNTER — Encounter: Payer: Self-pay | Admitting: Internal Medicine

## 2015-02-01 ENCOUNTER — Ambulatory Visit (INDEPENDENT_AMBULATORY_CARE_PROVIDER_SITE_OTHER): Payer: BLUE CROSS/BLUE SHIELD | Admitting: Endocrinology

## 2015-02-01 ENCOUNTER — Encounter: Payer: Self-pay | Admitting: Endocrinology

## 2015-02-01 VITALS — BP 132/84 | HR 74 | Temp 98.5°F | Ht 72.0 in | Wt 272.0 lb

## 2015-02-01 DIAGNOSIS — E119 Type 2 diabetes mellitus without complications: Secondary | ICD-10-CM

## 2015-02-01 NOTE — Patient Instructions (Addendum)
check your blood sugar once a day.  vary the time of day when you check, between before the 3 meals, and at bedtime.  also check if you have symptoms of your blood sugar being too high or too low.  please keep a record of the readings and bring it to your next appointment here.  You can write it on any piece of paper.  please call us sooner if your blood sugar goes below 70, or if you have a lot of readings over 200.  blood tests are requested for you today.  We'll let you know about the results.  please consider these measures for your health:  minimize alcohol.  do not use tobacco products.  have a colonoscopy at least every 10 years from age 34. keep firearms safely stored.  always use seat belts.  have working smoke alarms in your home.  see an eye doctor and dentist regularly.  never drive under the influence of alcohol or drugs (including prescription drugs).  those with fair skin should take precautions against the sun.  Please come back for a follow-up appointment in 6 months.

## 2015-02-01 NOTE — Progress Notes (Signed)
we discussed code status.  pt requests full code, but would not want to be started or maintained on artificial life-support measures if there was not a reasonable chance of recovery 

## 2015-02-01 NOTE — Progress Notes (Signed)
Subjective:    Patient ID: [name] [name], male    DOB: Aug 03, 1956, 59 y.o.   MRN: [id number]  HPI Pt is here for regular wellness examination, and is feeling pretty well in general, and says chronic med probs are stable, except as noted below Past Medical History  Diagnosis Date  . Coronary atherosclerosis of native coronary artery     DES RCA 10/05  . Stroke     Left pons 6/11 - Aggrenox started  . Hyperlipidemia   . Type 2 diabetes mellitus   . Cholelithiasis   . Testicular cancer 1978  . Essential hypertension   . OA (osteoarthritis)   . Urolithiasis     Past Surgical History  Procedure Laterality Date  . Testicular tumor resection      Right side    History   Social History  . Marital Status: Married    Spouse Name: N/A  . Number of Children: 0  . Years of Education: N/A   Occupational History  . Truck driver    Social History Main Topics  . Smoking status: Former Smoker -- 1.50 packs/day for 43 years    Types: Cigarettes    Quit date: 04/21/2010  . Smokeless tobacco: Never Used  . Alcohol Use: Yes     Comment: "Not much.. 6 packs a year"  . Drug Use: No  . Sexual Activity: Not on file   Other Topics Concern  . Not on file   Social History Narrative    Current Outpatient Prescriptions on File Prior to Visit  Medication Sig Dispense Refill  . bromocriptine (PARLODEL) 2.5 MG tablet TAKE ONE-HALF (1/2) TABLET (1.25 MG TOTAL) AT BEDTIME 45 tablet 2  . canagliflozin (INVOKANA) 300 MG TABS tablet Take 300 mg by mouth daily. 90 tablet 2  . colesevelam (WELCHOL) 625 MG tablet TAKE 3 TABLETS (1875 MG) TWICE A DAY WITH MEALS. 270 tablet 2  . dipyridamole-aspirin (AGGRENOX) 200-25 MG per 12 hr capsule Take 1 capsule by mouth 2 (two) times daily. 180 capsule 2  . fish oil-omega-3 fatty acids 1000 MG capsule Take 1 capsule by mouth 2 (two) times daily.     Marland Kitchen JANUVIA 100 MG tablet TAKE 1 TABLET BY MOUTH DAILY 30 tablet 2  . metFORMIN (GLUCOPHAGE) 500 MG tablet  Take 1 tablet (500 mg total) by mouth 2 (two) times daily. 180 tablet 2  . Multiple Vitamin (MULTIVITAMIN) tablet Take 1 tablet by mouth daily.    . nitroGLYCERIN (NITROSTAT) 0.4 MG SL tablet Place 1 tablet (0.4 mg total) under the tongue every 5 (five) minutes as needed. May repeat every 5 minutes for up to 3 doses 25 tablet 6  . pioglitazone (ACTOS) 30 MG tablet Take 1 tablet (30 mg total) by mouth daily. 90 tablet 2  . simvastatin (ZOCOR) 40 MG tablet Take 1 tablet (40 mg total) by mouth at bedtime. 90 tablet 3   No current facility-administered medications on file prior to visit.    No Known Allergies  Family History  Problem Relation Age of Onset  . Heart disease Father   . Diabetes Father   . Heart attack Father     BP 132/84 mmHg  Pulse 74  Temp(Src) 98.5 F (36.9 C) (Oral)  Ht 6' (1.829 m)  Wt 272 lb (123.378 kg)  BMI 36.88 kg/m2  SpO2 95%     Review of Systems  Constitutional: Negative for fever and unexpected weight change.  HENT: Negative for hearing loss.  Eyes: Negative for visual disturbance.  Respiratory: Negative for shortness of breath.   Cardiovascular: Negative for chest pain.  Gastrointestinal: Negative for anal bleeding.  Endocrine: Negative for cold intolerance.  Genitourinary: Negative for hematuria.  Musculoskeletal: Negative for back pain.  Skin: Negative for rash.  Allergic/Immunologic: Negative for environmental allergies.  Neurological: Negative for syncope and numbness.  Hematological: Bruises/bleeds easily.  Psychiatric/Behavioral: Negative for dysphoric mood.       Objective:   Physical Exam VS: see vs page GEN: no distress HEAD: head: no deformity eyes: no periorbital swelling, no proptosis external nose and ears are normal mouth: no lesion seen NECK: supple, thyroid is not enlarged CHEST WALL: no deformity LUNGS: clear to auscultation BREASTS:  No gynecomastia CV: reg rate and rhythm, no murmur ABD: abdomen is soft,  nontender.  no hepatosplenomegaly.  not distended.  no hernia.   RECTAL: normal external and internal exam.  heme neg. PROSTATE:  Normal size.  No nodule MUSCULOSKELETAL: muscle bulk and strength are grossly normal.  no obvious joint swelling.  gait is normal and steady PULSES:  no carotid bruit NEURO:  cn 2-12 grossly intact.   readily moves all 4's.  SKIN:  Normal texture and temperature.  No rash or suspicious lesion is visible.   NODES:  None palpable at the neck PSYCH: alert, well-oriented.  Does not appear anxious nor depressed.       Assessment & Plan:  Wellness visit today, with problems stable, except as noted.

## 2015-02-15 ENCOUNTER — Telehealth: Payer: Self-pay | Admitting: Endocrinology

## 2015-02-15 DIAGNOSIS — R911 Solitary pulmonary nodule: Secondary | ICD-10-CM

## 2015-02-15 NOTE — Telephone Encounter (Signed)
It is very unlikely that this is anything to worry about, but please do the CT.  you will receive a phone call, about a day and time for an appointment

## 2015-02-15 NOTE — Telephone Encounter (Signed)
Patient advised of MD's note below. Patient voiced understanding. Patient is to call our office back on Monday if he has note been scheduled for his CT.

## 2015-02-15 NOTE — Telephone Encounter (Signed)
Patient wife need to talk to you concerning husband x ray.

## 2015-02-15 NOTE — Telephone Encounter (Signed)
I contacted the patient. He stated yesterday he went to [facility] urgent care for congestion and a persistent cough. While there the patient had a Chest X-ray done. On the result a nodule was found on his Right lung. I contacted [facility] urgent care and had the report faxed to our office. Report is on your desk. Patient wanted to know what should be done from here. Please advise. Thanks!

## 2015-02-15 NOTE — Telephone Encounter (Signed)
Patient wife just called she will be sending over a fax

## 2015-02-26 ENCOUNTER — Ambulatory Visit (HOSPITAL_COMMUNITY)
Admission: RE | Admit: 2015-02-26 | Discharge: 2015-02-26 | Disposition: A | Discharge status: Home / Self Care | Payer: BLUE CROSS/BLUE SHIELD | Source: Ambulatory Visit | Attending: Endocrinology | Admitting: Endocrinology

## 2015-02-26 DIAGNOSIS — J189 Pneumonia, unspecified organism: Secondary | ICD-10-CM | POA: Diagnosis not present

## 2015-02-26 DIAGNOSIS — R911 Solitary pulmonary nodule: Secondary | ICD-10-CM | POA: Diagnosis present

## 2015-03-12 ENCOUNTER — Other Ambulatory Visit: Payer: Self-pay | Admitting: Cardiology

## 2015-03-12 MED ORDER — NITROGLYCERIN 0.4 MG SL SUBL: 0.4000 mg | SUBLINGUAL_TABLET | SUBLINGUAL | Status: DC | PRN

## 2015-03-12 NOTE — Telephone Encounter (Signed)
[name] [name] needs to have nitroGLYCERIN (NITROSTAT) 0.4 MG SL tablet sent to [facility].

## 2015-03-21 ENCOUNTER — Other Ambulatory Visit: Payer: Self-pay

## 2015-03-21 MED ORDER — SITAGLIPTIN PHOSPHATE 100 MG PO TABS
100.0000 mg | ORAL_TABLET | Freq: Every day | ORAL | Status: DC
Start: 2015-03-21 — End: 2015-10-08

## 2015-04-15 ENCOUNTER — Other Ambulatory Visit: Payer: Self-pay

## 2015-04-15 MED ORDER — ASPIRIN-DIPYRIDAMOLE ER 25-200 MG PO CP12: 1.0000 | ORAL_CAPSULE | Freq: Two times a day (BID) | ORAL | Status: DC

## 2015-04-16 ENCOUNTER — Other Ambulatory Visit: Payer: Self-pay | Admitting: *Deleted

## 2015-04-16 MED ORDER — SIMVASTATIN 40 MG PO TABS: 40.0000 mg | ORAL_TABLET | Freq: Every day | ORAL | Status: DC

## 2015-04-16 NOTE — Telephone Encounter (Signed)
Message left on voice mail - requesting Simvastatin refill to [facility].    Attempted to return call - left info on voice mail that RX for 90 day had been sent to pharmacy.    Future refills from [facility] as Dr. [name] has been doing his labs (see EPIC).

## 2015-05-14 ENCOUNTER — Telehealth: Payer: Self-pay | Admitting: Endocrinology

## 2015-05-14 MED ORDER — PIOGLITAZONE HCL 30 MG PO TABS: 30.0000 mg | ORAL_TABLET | Freq: Every day | ORAL | Status: DC

## 2015-05-14 MED ORDER — CANAGLIFLOZIN 300 MG PO TABS: 300.0000 mg | ORAL_TABLET | Freq: Every day | ORAL | Status: DC

## 2015-05-14 MED ORDER — BROMOCRIPTINE MESYLATE 2.5 MG PO TABS: ORAL_TABLET | ORAL | Status: DC

## 2015-05-14 MED ORDER — METFORMIN HCL 500 MG PO TABS: 500.0000 mg | ORAL_TABLET | Freq: Two times a day (BID) | ORAL | Status: DC

## 2015-05-14 NOTE — Telephone Encounter (Signed)
Patient need refill medications,   bromocriptine (PARLODEL) 2.5 MG tablet  canagliflozin (INVOKANA) 300 MG TABS tablet  pioglitazone (ACTOS) 30 MG tablet  metFORMIN (GLUCOPHAGE) 500 MG tablet  Send to  [place], [place] [phone] (Phone) [phone] (Fax)

## 2015-05-14 NOTE — Telephone Encounter (Signed)
Rx's sent to the pt's pharmacy per his request.

## 2015-07-29 ENCOUNTER — Other Ambulatory Visit: Payer: Self-pay | Admitting: Cardiology

## 2015-08-19 ENCOUNTER — Other Ambulatory Visit: Payer: Self-pay

## 2015-08-19 MED ORDER — COLESEVELAM HCL 625 MG PO TABS: ORAL_TABLET | ORAL | Status: DC

## 2015-09-02 ENCOUNTER — Telehealth: Payer: Self-pay

## 2015-09-02 MED ORDER — CELECOXIB 200 MG PO CAPS: 200.0000 mg | ORAL_CAPSULE | Freq: Two times a day (BID) | ORAL | Status: DC

## 2015-09-02 NOTE — Telephone Encounter (Signed)
Pt is requesting a refill for Celebrex 200 mg to be sent for a 90 day supply to [facility] due to neck pain he has been experiencing. Patient stated he had been on this before in the past.  Please advise if we can refill this Rx. Thanks!

## 2015-09-02 NOTE — Telephone Encounter (Signed)
i refilled x 1 Ov is due

## 2015-09-03 NOTE — Telephone Encounter (Signed)
I contacted the pt's wife and advised of note below. Requested a call back for the patient to schedule his office visit.

## 2015-09-10 ENCOUNTER — Ambulatory Visit (INDEPENDENT_AMBULATORY_CARE_PROVIDER_SITE_OTHER): Payer: BLUE CROSS/BLUE SHIELD | Admitting: Endocrinology

## 2015-09-10 ENCOUNTER — Encounter: Payer: Self-pay | Admitting: Endocrinology

## 2015-09-10 VITALS — BP 136/80 | HR 70 | Temp 98.7°F | Ht 72.0 in | Wt 265.0 lb

## 2015-09-10 DIAGNOSIS — E119 Type 2 diabetes mellitus without complications: Secondary | ICD-10-CM

## 2015-09-10 LAB — POCT GLYCOSYLATED HEMOGLOBIN (HGB A1C): HEMOGLOBIN A1C: 6

## 2015-09-10 NOTE — Progress Notes (Signed)
Subjective:    Patient ID: [name] [name], male    DOB: Apr 23, 1956, 59 y.o.   MRN: [id number]  HPI Pt returns for f/u of diabetes mellitus: DM type: 2 Dx'ed: [id number] Complications: CAD and CVA Therapy: 6 oral meds DKA: never Severe hypoglycemia: never Pancreatitis: never Other:  Interval history: no cbg record, but states cbg's are well-controlled.  pt states he feels well in general. He has resumed working as a Pharmacist, community.   Past Medical History  Diagnosis Date  . Coronary atherosclerosis of native coronary artery     DES RCA 10/05  . Stroke [facility])     Left pons 6/11 - Aggrenox started  . Hyperlipidemia   . Type 2 diabetes mellitus ([place])   . Cholelithiasis   . Testicular cancer ([place]) 1978  . Essential hypertension   . OA (osteoarthritis)   . Urolithiasis     Past Surgical History  Procedure Laterality Date  . Testicular tumor resection      Right side    Social History   Social History  . Marital Status: Married    Spouse Name: N/A  . Number of Children: 0  . Years of Education: N/A   Occupational History  . Truck driver    Social History Main Topics  . Smoking status: Former Smoker -- 1.50 packs/day for 43 years    Types: Cigarettes    Quit date: 04/21/2010  . Smokeless tobacco: Never Used  . Alcohol Use: Yes     Comment: "Not much.. 6 packs a year"  . Drug Use: No  . Sexual Activity: Not on file   Other Topics Concern  . Not on file   Social History Narrative    Current Outpatient Prescriptions on File Prior to Visit  Medication Sig Dispense Refill  . bromocriptine (PARLODEL) 2.5 MG tablet TAKE ONE-HALF (1/2) TABLET (1.25 MG TOTAL) AT BEDTIME 45 tablet 2  . canagliflozin (INVOKANA) 300 MG TABS tablet Take 300 mg by mouth daily. 90 tablet 2  . celecoxib (CELEBREX) 200 MG capsule Take 1 capsule (200 mg total) by mouth 2 (two) times daily. 90 capsule 0  . colesevelam (WELCHOL) 625 MG tablet TAKE 3 TABLETS (1875 MG) TWICE A DAY WITH MEALS. 270 tablet 0    . dipyridamole-aspirin (AGGRENOX) 200-25 MG per 12 hr capsule Take 1 capsule by mouth 2 (two) times daily. 180 capsule 2  . fish oil-omega-3 fatty acids 1000 MG capsule Take 1 capsule by mouth 2 (two) times daily.     . metFORMIN (GLUCOPHAGE) 500 MG tablet Take 1 tablet (500 mg total) by mouth 2 (two) times daily. 180 tablet 2  . Multiple Vitamin (MULTIVITAMIN) tablet Take 1 tablet by mouth daily.    . nitroGLYCERIN (NITROSTAT) 0.4 MG SL tablet Place 1 tablet (0.4 mg total) under the tongue every 5 (five) minutes x 3 doses as needed. May repeat every 5 minutes for up to 3 doses 75 tablet 3  . pioglitazone (ACTOS) 30 MG tablet Take 1 tablet (30 mg total) by mouth daily. 90 tablet 2  . simvastatin (ZOCOR) 40 MG tablet TAKE 1 TABLET AT BEDTIME 90 tablet 1  . sitaGLIPtin (JANUVIA) 100 MG tablet Take 1 tablet (100 mg total) by mouth daily. 90 tablet 1   No current facility-administered medications on file prior to visit.    No Known Allergies  Family History  Problem Relation Age of Onset  . Heart disease Father   . Diabetes Father   . Heart attack  Father     BP 136/80 mmHg  Pulse 70  Temp(Src) 98.7 F (37.1 C) (Oral)  Ht 6' (1.829 m)  Wt 265 lb (120.203 kg)  BMI 35.93 kg/m2  SpO2 97%  Review of Systems He has lost a few lbs.      Objective:   Physical Exam VITAL SIGNS:  See vs page. GENERAL: no distress.  Pulses: dorsalis pedis intact bilat.   MSK: no deformity of the feet CV: no leg edema.  Skin:  no ulcer on the feet.  normal color and temp on the feet.  Neuro: sensation is intact to touch on the feet.      A1c=6.0%    Assessment & Plan:  DM: well-controlled.  Patient is advised the following: Patient Instructions  check your blood sugar once a day.  vary the time of day when you check, between before the 3 meals, and at bedtime.  also check if you have symptoms of your blood sugar being too high or too low.  please keep a record of the readings and bring it to  your next appointment here.  You can write it on any piece of paper.  please call us sooner if your blood sugar goes below 70, or if you have a lot of readings over 200.  Please continue the same medications.   Please come back for a regular physical appointment in 5 months (must be after 02/01/16).

## 2015-09-10 NOTE — Patient Instructions (Addendum)
check your blood sugar once a day.  vary the time of day when you check, between before the 3 meals, and at bedtime.  also check if you have symptoms of your blood sugar being too high or too low.  please keep a record of the readings and bring it to your next appointment here.  You can write it on any piece of paper.  please call us sooner if your blood sugar goes below 70, or if you have a lot of readings over 200.  Please continue the same medications.   Please come back for a regular physical appointment in 5 months (must be after 02/01/16).

## 2015-09-11 ENCOUNTER — Telehealth: Payer: Self-pay | Admitting: Endocrinology

## 2015-09-11 NOTE — Telephone Encounter (Signed)
please call patient: Ins is questioning your celebrex. They want you to take ibuprofen or naproxen Please remind me: did these meds not work, or for what other reason can you not take these?

## 2015-09-12 ENCOUNTER — Other Ambulatory Visit: Payer: Self-pay

## 2015-09-12 NOTE — Telephone Encounter (Signed)
Left a voicemail advising of note below. Requested call back from pt to discuss.

## 2015-09-13 NOTE — Telephone Encounter (Signed)
Patient wife ask you to call her

## 2015-09-13 NOTE — Telephone Encounter (Signed)
Requested a call back to discuss.  

## 2015-09-16 ENCOUNTER — Telehealth: Payer: Self-pay | Admitting: Endocrinology

## 2015-09-16 MED ORDER — OMEPRAZOLE 40 MG PO CPDR: 40.0000 mg | DELAYED_RELEASE_CAPSULE | Freq: Every day | ORAL | Status: DC

## 2015-09-16 MED ORDER — NAPROXEN SODIUM 275 MG PO TABS: 275.0000 mg | ORAL_TABLET | ORAL | Status: DC

## 2015-09-16 NOTE — Addendum Note (Signed)
Addended by: Renato Shin on: 09/16/2015 02:53 PM   Modules accepted: Orders, Medications

## 2015-09-16 NOTE — Telephone Encounter (Signed)
Ok, i have sent a prescription to [facility]. You should take prilosec along with this, to protect your stomach. i have sent a prescription for this, too.

## 2015-09-16 NOTE — Telephone Encounter (Signed)
I contacted the pt's wife and advised of note below. She stated she does not think the pt has ever tried the prescription level Ibuprofen or Naproxen. Pt agrees to trying either of the two medications in place of the Celebrex. Please advise, Thanks!

## 2015-09-16 NOTE — Telephone Encounter (Signed)
Patient wife return your call

## 2015-09-16 NOTE — Telephone Encounter (Signed)
Requested a call back from the pt's wife.

## 2015-09-16 NOTE — Telephone Encounter (Signed)
I contacted the pt's wife and advised of note below. Pt's wife voiced understanding.

## 2015-09-25 ENCOUNTER — Telehealth: Payer: Self-pay | Admitting: Endocrinology

## 2015-09-25 NOTE — Telephone Encounter (Signed)
Januvia needs to be refilled and called into [facility] [phone]  [facility]

## 2015-10-08 ENCOUNTER — Other Ambulatory Visit: Payer: Self-pay

## 2015-10-08 MED ORDER — SITAGLIPTIN PHOSPHATE 100 MG PO TABS: 100.0000 mg | ORAL_TABLET | Freq: Every day | ORAL | Status: DC

## 2015-10-08 MED ORDER — COLESEVELAM HCL 625 MG PO TABS: ORAL_TABLET | ORAL | Status: DC

## 2015-10-21 ENCOUNTER — Telehealth: Payer: Self-pay | Admitting: Endocrinology

## 2015-10-21 NOTE — Telephone Encounter (Signed)
Requested a call back from the pt's wife.

## 2015-10-21 NOTE — Telephone Encounter (Signed)
Wife calling regarding symptoms he is having she wanted to discuss with Jinny Blossom he is having intestinal issues

## 2015-10-22 ENCOUNTER — Telehealth: Payer: Self-pay | Admitting: Endocrinology

## 2015-10-22 NOTE — Telephone Encounter (Signed)
Patient wife is returning your call 

## 2015-10-22 NOTE — Telephone Encounter (Signed)
Requested a call back from the pt's wife.

## 2015-10-25 ENCOUNTER — Ambulatory Visit (INDEPENDENT_AMBULATORY_CARE_PROVIDER_SITE_OTHER): Payer: BLUE CROSS/BLUE SHIELD | Admitting: Endocrinology

## 2015-10-25 ENCOUNTER — Encounter: Payer: Self-pay | Admitting: Endocrinology

## 2015-10-25 VITALS — BP 142/88 | HR 78 | Temp 97.8°F | Ht 72.0 in | Wt 264.0 lb

## 2015-10-25 DIAGNOSIS — R197 Diarrhea, unspecified: Secondary | ICD-10-CM | POA: Diagnosis not present

## 2015-10-25 MED ORDER — METFORMIN HCL ER 500 MG PO TB24: 1000.0000 mg | ORAL_TABLET | Freq: Every day | ORAL | Status: DC

## 2015-10-25 NOTE — Progress Notes (Signed)
Subjective:    Patient ID: [name] [name], male    DOB: [birth year], 60 y.o.   MRN: [id number]  HPI Pt states 1 week of intermittent diarrhea, fecal incontinence, and assoc flatulence.  No abd pain or cramps.  He has city water.  No foreign travel.  No recent abx. No one else at home has similar illness.  Past Medical History  Diagnosis Date  . Coronary atherosclerosis of native coronary artery     DES RCA 10/05  . Stroke [facility])     Left pons 6/11 - Aggrenox started  . Hyperlipidemia   . Type 2 diabetes mellitus ([place])   . Cholelithiasis   . Testicular cancer ([place]) 1978  . Essential hypertension   . OA (osteoarthritis)   . Urolithiasis     Past Surgical History  Procedure Laterality Date  . Testicular tumor resection      Right side    Social History   Social History  . Marital Status: Married    Spouse Name: N/A  . Number of Children: 0  . Years of Education: N/A   Occupational History  . Truck driver    Social History Main Topics  . Smoking status: Former Smoker -- 1.50 packs/day for 43 years    Types: Cigarettes    Quit date: 04/21/2010  . Smokeless tobacco: Never Used  . Alcohol Use: Yes     Comment: "Not much.. 6 packs a year"  . Drug Use: No  . Sexual Activity: Not on file   Other Topics Concern  . Not on file   Social History Narrative    Current Outpatient Prescriptions on File Prior to Visit  Medication Sig Dispense Refill  . bromocriptine (PARLODEL) 2.5 MG tablet TAKE ONE-HALF (1/2) TABLET (1.25 MG TOTAL) AT BEDTIME 45 tablet 2  . canagliflozin (INVOKANA) 300 MG TABS tablet Take 300 mg by mouth daily. (Patient taking differently: Take 150 mg by mouth 2 (two) times daily. ) 90 tablet 2  . colesevelam (WELCHOL) 625 MG tablet TAKE 3 TABLETS (1875 MG) TWICE A DAY WITH MEALS. (Patient taking differently: Take 1,875 mg by mouth daily. TAKE 3 TABLETS (1875 MG) TWICE A DAY WITH MEALS.) 270 tablet 2  . dipyridamole-aspirin (AGGRENOX) 200-25 MG per 12 hr  capsule Take 1 capsule by mouth 2 (two) times daily. 180 capsule 2  . fish oil-omega-3 fatty acids 1000 MG capsule Take 1 capsule by mouth 2 (two) times daily.     . Multiple Vitamin (MULTIVITAMIN) tablet Take 1 tablet by mouth daily.    . naproxen sodium (ANAPROX) 275 MG tablet Take 1 tablet (275 mg total) by mouth every morning. 90 tablet 3  . nitroGLYCERIN (NITROSTAT) 0.4 MG SL tablet Place 1 tablet (0.4 mg total) under the tongue every 5 (five) minutes x 3 doses as needed. May repeat every 5 minutes for up to 3 doses 75 tablet 3  . omeprazole (PRILOSEC) 40 MG capsule Take 1 capsule (40 mg total) by mouth daily. 90 capsule 3  . pioglitazone (ACTOS) 30 MG tablet Take 1 tablet (30 mg total) by mouth daily. 90 tablet 2  . simvastatin (ZOCOR) 40 MG tablet TAKE 1 TABLET AT BEDTIME 90 tablet 1  . sitaGLIPtin (JANUVIA) 100 MG tablet Take 1 tablet (100 mg total) by mouth daily. 90 tablet 1   No current facility-administered medications on file prior to visit.    No Known Allergies  Family History  Problem Relation Age of Onset  . Heart disease  Father   . Diabetes Father   . Heart attack Father     BP 142/88 mmHg  Pulse 78  Temp(Src) 97.8 F (36.6 C) (Oral)  Ht 6' (1.829 m)  Wt 264 lb (119.75 kg)  BMI 35.80 kg/m2  SpO2 97%  Review of Systems Denies BRBPR and fever    Objective:   Physical Exam VITAL SIGNS:  See vs page GENERAL: no distress ABDOMEN: abdomen is soft, nontender.  no hepatosplenomegaly.  not distended.  no hernia.       Assessment & Plan:  Diarrhea: new.  Most likely is a combination of metformin and viral illness.  HTN: prob exac by this illness.  We'll recheck in the future.    Patient is advised the following: Patient Instructions  Please try stopping the metformin. If you are better by next week, please resume the metformin using the extended-release.  i have sent a prescription to your pharmacy I hope you feel better soon.  If you don't feel better by next  week, please call back.  Please call sooner if you get worse.

## 2015-10-25 NOTE — Patient Instructions (Addendum)
Please try stopping the metformin. If you are better by next week, please resume the metformin using the extended-release.  i have sent a prescription to your pharmacy I hope you feel better soon.  If you don't feel better by next week, please call back.  Please call sooner if you get worse.

## 2015-10-26 ENCOUNTER — Emergency Department (HOSPITAL_COMMUNITY)
Admission: EM | Admit: 2015-10-26 | Discharge: 2015-10-26 | Disposition: A | Discharge status: Home / Self Care | Payer: BLUE CROSS/BLUE SHIELD | Attending: Emergency Medicine | Admitting: Emergency Medicine

## 2015-10-26 ENCOUNTER — Encounter (HOSPITAL_COMMUNITY): Payer: Self-pay | Admitting: *Deleted

## 2015-10-26 ENCOUNTER — Emergency Department (HOSPITAL_COMMUNITY): Payer: BLUE CROSS/BLUE SHIELD

## 2015-10-26 DIAGNOSIS — Z8719 Personal history of other diseases of the digestive system: Secondary | ICD-10-CM | POA: Diagnosis not present

## 2015-10-26 DIAGNOSIS — Z7982 Long term (current) use of aspirin: Secondary | ICD-10-CM | POA: Diagnosis not present

## 2015-10-26 DIAGNOSIS — I2510 Atherosclerotic heart disease of native coronary artery without angina pectoris: Secondary | ICD-10-CM | POA: Insufficient documentation

## 2015-10-26 DIAGNOSIS — Z8711 Personal history of peptic ulcer disease: Secondary | ICD-10-CM | POA: Insufficient documentation

## 2015-10-26 DIAGNOSIS — Z8673 Personal history of transient ischemic attack (TIA), and cerebral infarction without residual deficits: Secondary | ICD-10-CM | POA: Diagnosis not present

## 2015-10-26 DIAGNOSIS — I10 Essential (primary) hypertension: Secondary | ICD-10-CM | POA: Diagnosis not present

## 2015-10-26 DIAGNOSIS — E119 Type 2 diabetes mellitus without complications: Secondary | ICD-10-CM | POA: Diagnosis not present

## 2015-10-26 DIAGNOSIS — Z8547 Personal history of malignant neoplasm of testis: Secondary | ICD-10-CM | POA: Diagnosis not present

## 2015-10-26 DIAGNOSIS — M1990 Unspecified osteoarthritis, unspecified site: Secondary | ICD-10-CM | POA: Diagnosis not present

## 2015-10-26 DIAGNOSIS — Z7984 Long term (current) use of oral hypoglycemic drugs: Secondary | ICD-10-CM | POA: Diagnosis not present

## 2015-10-26 DIAGNOSIS — E785 Hyperlipidemia, unspecified: Secondary | ICD-10-CM | POA: Diagnosis not present

## 2015-10-26 DIAGNOSIS — R079 Chest pain, unspecified: Secondary | ICD-10-CM | POA: Insufficient documentation

## 2015-10-26 DIAGNOSIS — Z79899 Other long term (current) drug therapy: Secondary | ICD-10-CM | POA: Diagnosis not present

## 2015-10-26 DIAGNOSIS — Z87448 Personal history of other diseases of urinary system: Secondary | ICD-10-CM | POA: Insufficient documentation

## 2015-10-26 DIAGNOSIS — Z87891 Personal history of nicotine dependence: Secondary | ICD-10-CM | POA: Diagnosis not present

## 2015-10-26 DIAGNOSIS — Z791 Long term (current) use of non-steroidal anti-inflammatories (NSAID): Secondary | ICD-10-CM | POA: Insufficient documentation

## 2015-10-26 LAB — CBC WITH DIFFERENTIAL/PLATELET
Basophils Absolute: 0 10*3/uL (ref 0.0–0.1)
Basophils Relative: 0 %
Eosinophils Absolute: 0.3 10*3/uL (ref 0.0–0.7)
Eosinophils Relative: 4 %
HCT: 46.6 % (ref 39.0–52.0)
Hemoglobin: 16.1 g/dL (ref 13.0–17.0)
Lymphocytes Relative: 37 %
Lymphs Abs: 3 10*3/uL (ref 0.7–4.0)
MCH: 31.4 pg (ref 26.0–34.0)
MCHC: 34.5 g/dL (ref 30.0–36.0)
MCV: 91 fL (ref 78.0–100.0)
Monocytes Absolute: 0.5 10*3/uL (ref 0.1–1.0)
Monocytes Relative: 6 %
Neutro Abs: 4.2 10*3/uL (ref 1.7–7.7)
Neutrophils Relative %: 53 %
Platelets: 161 10*3/uL (ref 150–400)
RBC: 5.12 MIL/uL (ref 4.22–5.81)
RDW: 13.6 % (ref 11.5–15.5)
WBC: 8.1 10*3/uL (ref 4.0–10.5)

## 2015-10-26 LAB — BASIC METABOLIC PANEL
Anion gap: 11 (ref 5–15)
BUN: 19 mg/dL (ref 6–20)
CO2: 28 mmol/L (ref 22–32)
Calcium: 10.5 mg/dL — ABNORMAL HIGH (ref 8.9–10.3)
Chloride: 101 mmol/L (ref 101–111)
Creatinine, Ser: 1.13 mg/dL (ref 0.61–1.24)
GFR calc Af Amer: >60 mL/min (ref >60)
GFR calc non Af Amer: >60 mL/min (ref >60)
Glucose, Bld: 173 mg/dL — ABNORMAL HIGH (ref 65–99)
Potassium: 4.4 mmol/L (ref 3.5–5.1)
Sodium: 140 mmol/L (ref 135–145)

## 2015-10-26 LAB — TROPONIN I: Troponin I: <0.03 ng/mL (ref <0.031)

## 2015-10-26 MED ORDER — GI COCKTAIL ~~LOC~~
30.0000 mL | Freq: Once | ORAL | Status: AC
  Administered 2015-10-26: 30 mL via ORAL
  Filled 2015-10-26: qty 30

## 2015-10-26 NOTE — Discharge Instructions (Signed)
Nonspecific Chest Pain  °Chest pain can be caused by many different conditions. There is always a chance that your pain could be related to something serious, such as a heart attack or a blood clot in your lungs. Chest pain can also be caused by conditions that are not life-threatening. If you have chest pain, it is very important to follow up with your health care provider. °CAUSES  °Chest pain can be caused by: °· Heartburn. °· Pneumonia or bronchitis. °· Anxiety or stress. °· Inflammation around your heart (pericarditis) or lung (pleuritis or pleurisy). °· A blood clot in your lung. °· A collapsed lung (pneumothorax). It can develop suddenly on its own (spontaneous pneumothorax) or from trauma to the chest. °· Shingles infection (varicella-zoster virus). °· Heart attack. °· Damage to the bones, muscles, and cartilage that make up your chest wall. This can include: °¨ Bruised bones due to injury. °¨ Strained muscles or cartilage due to frequent or repeated coughing or overwork. °¨ Fracture to one or more ribs. °¨ Sore cartilage due to inflammation (costochondritis). °RISK FACTORS  °Risk factors for chest pain may include: °· Activities that increase your risk for trauma or injury to your chest. °· Respiratory infections or conditions that cause frequent coughing. °· Medical conditions or overeating that can cause heartburn. °· Heart disease or family history of heart disease. °· Conditions or health behaviors that increase your risk of developing a blood clot. °· Having had chicken pox (varicella zoster). °SIGNS AND SYMPTOMS °Chest pain can feel like: °· Burning or tingling on the surface of your chest or deep in your chest. °· Crushing, pressure, aching, or squeezing pain. °· Dull or sharp pain that is worse when you move, cough, or take a deep breath. °· Pain that is also felt in your back, neck, shoulder, or arm, or pain that spreads to any of these areas. °Your chest pain may come and go, or it may stay  constant. °DIAGNOSIS °Lab tests or other studies may be needed to find the cause of your pain. Your health care provider may have you take a test called an ambulatory ECG (electrocardiogram). An ECG records your heartbeat patterns at the time the test is performed. You may also have other tests, such as: °· Transthoracic echocardiogram (TTE). During echocardiography, sound waves are used to create a picture of all of the heart structures and to look at how blood flows through your heart. °· Transesophageal echocardiogram (TEE). This is a more advanced imaging test that obtains images from inside your body. It allows your health care provider to see your heart in finer detail. °· Cardiac monitoring. This allows your health care provider to monitor your heart rate and rhythm in real time. °· Holter monitor. This is a portable device that records your heartbeat and can help to diagnose abnormal heartbeats. It allows your health care provider to track your heart activity for several days, if needed. °· Stress tests. These can be done through exercise or by taking medicine that makes your heart beat more quickly. °· Blood tests. °· Imaging tests. °TREATMENT  °Your treatment depends on what is causing your chest pain. Treatment may include: °· Medicines. These may include: °¨ Acid blockers for heartburn. °¨ Anti-inflammatory medicine. °¨ Pain medicine for inflammatory conditions. °¨ Antibiotic medicine, if an infection is present. °¨ Medicines to dissolve blood clots. °¨ Medicines to treat coronary artery disease. °· Supportive care for conditions that do not require medicines. This may include: °¨ Resting. °¨ Applying heat   or cold packs to injured areas. °¨ Limiting activities until pain decreases. °HOME CARE INSTRUCTIONS °· If you were prescribed an antibiotic medicine, finish it all even if you start to feel better. °· Avoid any activities that bring on chest pain. °· Do not use any tobacco products, including  cigarettes, chewing tobacco, or electronic cigarettes. If you need help quitting, ask your health care provider. °· Do not drink alcohol. °· Take medicines only as directed by your health care provider. °· Keep all follow-up visits as directed by your health care provider. This is important. This includes any further testing if your chest pain does not go away. °· If heartburn is the cause for your chest pain, you may be told to keep your head raised (elevated) while sleeping. This reduces the chance that acid will go from your stomach into your esophagus. °· Make lifestyle changes as directed by your health care provider. These may include: °¨ Getting regular exercise. Ask your health care provider to suggest some activities that are safe for you. °¨ Eating a heart-healthy diet. A registered dietitian can help you to learn healthy eating options. °¨ Maintaining a healthy weight. °¨ Managing diabetes, if necessary. °¨ Reducing stress. °SEEK MEDICAL CARE IF: °· Your chest pain does not go away after treatment. °· You have a rash with blisters on your chest. °· You have a fever. °SEEK IMMEDIATE MEDICAL CARE IF:  °· Your chest pain is worse. °· You have an increasing cough, or you cough up blood. °· You have severe abdominal pain. °· You have severe weakness. °· You faint. °· You have chills. °· You have sudden, unexplained chest discomfort. °· You have sudden, unexplained discomfort in your arms, back, neck, or jaw. °· You have shortness of breath at any time. °· You suddenly start to sweat, or your skin gets clammy. °· You feel nauseous or you vomit. °· You suddenly feel light-headed or dizzy. °· Your heart begins to beat quickly, or it feels like it is skipping beats. °These symptoms may represent a serious problem that is an emergency. Do not wait to see if the symptoms will go away. Get medical help right away. Call your local emergency services (911 in the [place] Do not drive yourself to the hospital. °  °This  information is not intended to replace advice given to you by your health care provider. Make sure you discuss any questions you have with your health care provider. °  °Document Released: 06/24/2005 Document Revised: 10/05/2014 Document Reviewed: 04/20/2014 °Elsevier Interactive Patient Education ©2016 [employer] ° °

## 2015-10-26 NOTE — ED Notes (Signed)
Pt c/o right side chest pain that started this am, pain is located on right side, described as being sharp, pt states that he did take 4 tums but the pain returned. Pain is associated with sob,

## 2015-10-26 NOTE — ED Notes (Signed)
MD at bedside. 

## 2015-10-30 ENCOUNTER — Telehealth: Payer: Self-pay | Admitting: Endocrinology

## 2015-10-30 DIAGNOSIS — R197 Diarrhea, unspecified: Secondary | ICD-10-CM | POA: Insufficient documentation

## 2015-10-30 NOTE — Telephone Encounter (Signed)
Please see a specialist.  you will receive a phone call, about a day and time for an appointment 

## 2015-10-30 NOTE — Telephone Encounter (Signed)
I contacted the pt's wife and advised of note below.

## 2015-10-30 NOTE — Telephone Encounter (Signed)
Patients wife called stating that she saw Dr. 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

## 2015-10-30 NOTE — ED Provider Notes (Signed)
CSN: [id number]     Arrival date & time 10/26/15  1925 History   First MD Initiated Contact with Patient 10/26/15 1928     Chief Complaint  Patient presents with  . Chest Pain     (Consider location/radiation/quality/duration/timing/severity/associated sxs/prior Treatment) HPI   60 year old male with chest pain/epigastric pain.Onset this morning shortly after eating breakfast of friability and neck which is a typical breakfast for him. Persistent throughout the day but did mildly improve. Try taking some Tums without much of an effect. Shortly before arrival he had worsening of the same pain again shortly after eating. Denies any associated symptoms such as dyspnea, diaphoresis, palpitations or nausea. He did not notice any  Change with exertion Denies any past history of peptic ulcer disease or reflux. No fevers or chills. No unusual leg pain or swelling. Denies past history of DVT/PE.Pain does not radiate.  Past Medical History  Diagnosis Date  . Coronary atherosclerosis of native coronary artery     DES RCA 10/05  . Stroke [facility])     Left pons 6/11 - Aggrenox started  . Hyperlipidemia   . Type 2 diabetes mellitus ([place])   . Cholelithiasis   . Testicular cancer ([place]) 1978  . Essential hypertension   . OA (osteoarthritis)   . Urolithiasis    Past Surgical History  Procedure Laterality Date  . Testicular tumor resection      Right side   Family History  Problem Relation Age of Onset  . Heart disease Father   . Diabetes Father   . Heart attack Father    Social History  Substance Use Topics  . Smoking status: Former Smoker -- 1.50 packs/day for 43 years    Types: Cigarettes    Quit date: 04/21/2010  . Smokeless tobacco: Never Used  . Alcohol Use: Yes     Comment: "Not much.. 6 packs a year"    Review of Systems  All systems reviewed and negative, other than as noted in HPI.   Allergies  Review of patient's allergies indicates no known allergies.  Home Medications    Prior to Admission medications   Medication Sig Start Date End Date Taking? Authorizing Provider  bromocriptine (PARLODEL) 2.5 MG tablet TAKE ONE-HALF (1/2) TABLET (1.25 MG TOTAL) AT BEDTIME 05/14/15  Yes Renato Shin, MD  canagliflozin (INVOKANA) 300 MG TABS tablet Take 300 mg by mouth daily. Patient taking differently: Take 150 mg by mouth 2 (two) times daily.  05/14/15  Yes Renato Shin, MD  colesevelam (WELCHOL) 625 MG tablet TAKE 3 TABLETS (1875 MG) TWICE A DAY WITH MEALS. Patient taking differently: Take 1,875 mg by mouth daily. TAKE 3 TABLETS (1875 MG) TWICE A DAY WITH MEALS. 10/08/15  Yes Renato Shin, MD  dipyridamole-aspirin (AGGRENOX) 200-25 MG per 12 hr capsule Take 1 capsule by mouth 2 (two) times daily. 04/15/15  Yes Renato Shin, MD  fish oil-omega-3 fatty acids 1000 MG capsule Take 1 capsule by mouth 2 (two) times daily.    Yes Historical Provider, MD  loperamide (IMODIUM) 2 MG capsule Take 2 mg by mouth as needed for diarrhea or loose stools.   Yes Historical Provider, MD  metFORMIN (GLUCOPHAGE-XR) 500 MG 24 hr tablet Take 2 tablets (1,000 mg total) by mouth daily. 10/25/15  Yes Renato Shin, MD  Multiple Vitamin (MULTIVITAMIN) tablet Take 1 tablet by mouth daily.   Yes Historical Provider, MD  omeprazole (PRILOSEC) 40 MG capsule Take 1 capsule (40 mg total) by mouth daily. 09/16/15  Yes Hilliard Clark  [name], MD  pioglitazone (ACTOS) 30 MG tablet Take 1 tablet (30 mg total) by mouth daily. 05/14/15  Yes Renato Shin, MD  simvastatin (ZOCOR) 40 MG tablet TAKE 1 TABLET AT BEDTIME 07/29/15  Yes Satira Sark, MD  sitaGLIPtin (JANUVIA) 100 MG tablet Take 1 tablet (100 mg total) by mouth daily. 10/08/15  Yes Renato Shin, MD  naproxen sodium (ANAPROX) 275 MG tablet Take 1 tablet (275 mg total) by mouth every morning. 09/16/15   Renato Shin, MD  nitroGLYCERIN (NITROSTAT) 0.4 MG SL tablet Place 1 tablet (0.4 mg total) under the tongue every 5 (five) minutes x 3 doses as needed. May repeat every  5 minutes for up to 3 doses 03/12/15   Satira Sark, MD   BP 126/71 mmHg  Pulse 59  Temp(Src) 97.7 F (36.5 C) (Oral)  Resp 10  Ht 6' (1.829 m)  Wt 265 lb (120.203 kg)  BMI 35.93 kg/m2  SpO2 97% Physical Exam  Constitutional: He appears well-developed and well-nourished. No distress.  HENT:  Head: Normocephalic and atraumatic.  Eyes: Conjunctivae are normal. Right eye exhibits no discharge. Left eye exhibits no discharge.  Neck: Neck supple.  Cardiovascular: Normal rate, regular rhythm and normal heart sounds.  Exam reveals no gallop and no friction rub.   No murmur heard. Pulmonary/Chest: Effort normal and breath sounds normal. No respiratory distress.  Abdominal: Soft. He exhibits no distension. There is no tenderness.  Musculoskeletal: He exhibits no edema or tenderness.  Lower extremities symmetric as compared to each other. No calf tenderness. Negative Homan's. No palpable cords.   Neurological: He is alert.  Skin: Skin is warm and dry.  Psychiatric: He has a normal mood and affect. His behavior is normal. Thought content normal.  Nursing note and vitals reviewed.   ED Course  Procedures (including critical care time) Labs Review Labs Reviewed  BASIC METABOLIC PANEL - Abnormal; Notable for the following:    Glucose, Bld 173 (*)    Calcium 10.5 (*)    All other components within normal limits  CBC WITH DIFFERENTIAL/PLATELET  TROPONIN I    Imaging Review No results found. I have personally reviewed and evaluated these images and lab results as part of my medical decision-making.   EKG Interpretation   Date/Time:  Saturday October 26 2015 19:37:31 EST Ventricular Rate:  60 PR Interval:  192 QRS Duration: 91 QT Interval:  377 QTC Calculation: 377 R Axis:   60 Text Interpretation:  Sinus rhythm similar to previous tracings Confirmed  by Santana Gosdin  MD, [place] ([id number]) on 10/26/2015 7:44:41 PM      MDM   Final diagnoses:  Chest pain, unspecified chest pain  type    60 year old male with chest pain. Doubt ACS, PE, dissection or other emergent process.Suspect that this might actually be GI in etiology. Advised trial of Pepcid. Return precautions were discussed. Outpatient follow-up otherwise.    Virgel Manifold, MD 10/30/15 1254

## 2015-10-30 NOTE — Telephone Encounter (Signed)
See note below and please advise, Thanks! 

## 2015-10-31 ENCOUNTER — Telehealth: Payer: Self-pay | Admitting: Internal Medicine

## 2015-10-31 NOTE — Telephone Encounter (Signed)
Patient's wife misunderstood they will be happy to see Nicoletta Ba PA tomorrow.  He will come at 2:45 to fill out paperwork for 3:00

## 2015-11-01 ENCOUNTER — Encounter: Payer: Self-pay | Admitting: Physician Assistant

## 2015-11-01 ENCOUNTER — Ambulatory Visit (INDEPENDENT_AMBULATORY_CARE_PROVIDER_SITE_OTHER): Payer: BLUE CROSS/BLUE SHIELD | Admitting: Physician Assistant

## 2015-11-01 ENCOUNTER — Other Ambulatory Visit: Payer: BLUE CROSS/BLUE SHIELD

## 2015-11-01 VITALS — BP 120/60 | HR 72 | Ht 71.25 in | Wt 265.2 lb

## 2015-11-01 DIAGNOSIS — R32 Unspecified urinary incontinence: Secondary | ICD-10-CM

## 2015-11-01 DIAGNOSIS — R197 Diarrhea, unspecified: Secondary | ICD-10-CM

## 2015-11-01 MED ORDER — SACCHAROMYCES BOULARDII 250 MG PO CAPS: 250.0000 mg | ORAL_CAPSULE | Freq: Two times a day (BID) | ORAL | Status: DC

## 2015-11-01 MED ORDER — METRONIDAZOLE 250 MG PO TABS: 250.0000 mg | ORAL_TABLET | Freq: Four times a day (QID) | ORAL | Status: DC

## 2015-11-01 NOTE — Patient Instructions (Signed)
Please go to the basement level to have your labs drawn.  We sent prescriptons to [facility] [address] [facility] [address].

## 2015-11-01 NOTE — Progress Notes (Signed)
Patient ID: [name] [name], male   DOB: Oct 25, 1955, 60 y.o.   MRN: [id number]   Subjective:    Patient ID: [name] [name], male    DOB: [birth year], 60 y.o.   MRN: [id number]  HPI  [name] [name] is a very nice 60 year old white male known remotely to Dr. Carlean Purl from screening colonoscopy done in January 2010. This was a normal exam. Patient has history of coronary artery disease, adult-onset diabetes mellitus, hyperlipidemia and prior history of a CVA for which she is on Aggrenox. He also has history of testicular cancer. Patient comes in today with his wife with complaints of acute onset of diarrhea about 2 weeks ago. Prior to that time his bowel movements had always been very normal. He says he is not having a bowel movement every single day but when he does have a bowel movement it is liquid malodorous diarrhea. He may have 2-3 bowel movements per day on these days. There is no melena or hematochezia. These have been very urgent episodes and a couple of them have occurred at nighttime without him being aware with incontinence. 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 He has not had any associated nausea vomiting fever chills aching etc. Appetite has been fine and weight has been stable. He has not been on any new medication supplements etc. no recent antibiotics. He is dried Imodium A-D without any benefit. He had seen Dr. [name] last  week i, and was told to hold his metformin which she has done over this past week again with no change in the diarrhea. CBC and be met on 10/26/2015 were unremarkable  Review of Systems Pertinent positive and negative review of systems were noted in the above HPI section.  All other review of systems was otherwise negative.  Outpatient Encounter Prescriptions as of 11/01/2015  Medication Sig  . bromocriptine (PARLODEL) 2.5 MG tablet TAKE ONE-HALF (1/2) TABLET (1.25 MG TOTAL) AT BEDTIME  . canagliflozin (INVOKANA) 300 MG TABS tablet Take 300 mg by mouth  daily. (Patient taking differently: Take 150 mg by mouth 2 (two) times daily. )  . colesevelam (WELCHOL) 625 MG tablet TAKE 3 TABLETS (1875 MG) TWICE A DAY WITH MEALS. (Patient taking differently: Take 1,875 mg by mouth daily. TAKE 3 TABLETS (1875 MG) TWICE A DAY WITH MEALS.)  . dipyridamole-aspirin (AGGRENOX) 200-25 MG per 12 hr capsule Take 1 capsule by mouth 2 (two) times daily.  . fish oil-omega-3 fatty acids 1000 MG capsule Take 1 capsule by mouth 2 (two) times daily.   Marland Kitchen loperamide (IMODIUM) 2 MG capsule Take 2 mg by mouth as needed for diarrhea or loose stools.  . Multiple Vitamin (MULTIVITAMIN) tablet Take 1 tablet by mouth daily.  . nitroGLYCERIN (NITROSTAT) 0.4 MG SL tablet Place 1 tablet (0.4 mg total) under the tongue every 5 (five) minutes x 3 doses as needed. May repeat every 5 minutes for up to 3 doses  . omeprazole (PRILOSEC) 40 MG capsule Take 1 capsule (40 mg total) by mouth daily.  . pioglitazone (ACTOS) 30 MG tablet Take 1 tablet (30 mg total) by mouth daily.  . simvastatin (ZOCOR) 40 MG tablet TAKE 1 TABLET AT BEDTIME  . sitaGLIPtin (JANUVIA) 100 MG tablet Take 1 tablet (100 mg total) by mouth daily.  . metFORMIN (GLUCOPHAGE-XR) 500 MG 24 hr tablet Take 2 tablets (1,000 mg total) by mouth daily. (Patient not taking: Reported on 11/01/2015)  . metroNIDAZOLE (FLAGYL) 250 MG tablet Take 1 tablet (250 mg total) by  mouth 4 (four) times daily.  . naproxen sodium (ANAPROX) 275 MG tablet Take 1 tablet (275 mg total) by mouth every morning. (Patient not taking: Reported on 11/01/2015)  . saccharomyces boulardii (FLORASTOR) 250 MG capsule Take 1 capsule (250 mg total) by mouth 2 (two) times daily.   No facility-administered encounter medications on file as of 11/01/2015.   No Known Allergies Patient Active Problem List   Diagnosis Date Noted  . Diarrhea 10/30/2015  . Pulmonary nodule, right 02/15/2015  . Wellness examination 11/02/2014  . Diabetes ([place]) 09/02/2013  . Screening for  prostate cancer 05/26/2013  . Routine general medical examination at a health care facility 05/26/2013  . Encounter for long-term (current) use of other medications 07/07/2011  . CVA 04/02/2010  . Essential hypertension 06/13/2009  . DIABETES MELLITUS, TYPE II 07/02/2007  . Mixed hyperlipidemia 07/02/2007  . Coronary atherosclerosis of native coronary artery 07/02/2007  . NEOPLASM, MALIGNANT, TESTES, HX OF 07/02/2007   Social History   Social History  . Marital Status: Married    Spouse Name: N/A  . Number of Children: 0  . Years of Education: N/A   Occupational History  . Truck driver    Social History Main Topics  . Smoking status: Former Smoker -- 1.50 packs/day for 43 years    Types: Cigarettes    Quit date: 04/21/2010  . Smokeless tobacco: Never Used  . Alcohol Use: 0.0 oz/week    0 Standard drinks or equivalent per week     Comment: "Not much..1 (6 pack) a year"  . Drug Use: No  . Sexual Activity: Not on file   Other Topics Concern  . Not on file   Social History Narrative    Mr. Kratochvil family history includes Diabetes in his father; Heart attack in his father; Heart disease in his father.      Objective:    Filed Vitals:   11/01/15 1455  BP: 120/60  Pulse: 72    Physical Exam  well-developed white male in no acute distress, pleasant blood pressure 120/60 pulse 72 height 5 foot 11 weight 265, BMI 36.7. HEENT; nontraumatic normocephalic EOMI PERRLA sclera anicteric, Cardiovascular r;egular rate and rhythm with S1-S2 no murmur or gallop, Pulmonary ;clear bilaterally, Abdomen; large soft nontender nondistended bowel sounds are active there is no palpable mass or hepatosplenomegaly bowel sounds are present, Rectal ;exam ; not done, Ext; no clubbing cyanosis or edema skin warm and dry, Neuropsych; mood and affect appropriate     Assessment & Plan:   #1 60 yo male with  2 week hx of urgent diarrhea /episodes of nocturnal incontinence Suspect infectious  etiology- R/O C Diff No change in sxs off Metformin x one week #2 hx CVA- on aggrenox #3 CAD #4 HtN #5 AODM  Plan; check GI pathogen panel and stool for C. difficile PCR Will go ahead with empiric metronidazole 250 mg by mouth 4 times a day 2 weeks Start Florastor one by mouth twice a day 1 month Bland diet Further plans pending results of stool studies, and response to above.    Amy S Esterwood PA-C 11/01/2015   Cc: Renato Shin, MD

## 2015-11-04 ENCOUNTER — Other Ambulatory Visit: Payer: BLUE CROSS/BLUE SHIELD

## 2015-11-04 DIAGNOSIS — R32 Unspecified urinary incontinence: Secondary | ICD-10-CM

## 2015-11-04 DIAGNOSIS — R197 Diarrhea, unspecified: Secondary | ICD-10-CM

## 2015-11-05 LAB — CLOSTRIDIUM DIFFICILE BY PCR

## 2015-11-05 NOTE — Progress Notes (Signed)
Agree with Ms. Esterwood's assessment and plan. Carl E. Gessner, MD, FACG   

## 2015-11-07 LAB — GASTROINTESTINAL PATHOGEN PANEL PCR

## 2015-12-01 ENCOUNTER — Other Ambulatory Visit: Payer: Self-pay | Admitting: Endocrinology

## 2016-01-12 ENCOUNTER — Other Ambulatory Visit: Payer: Self-pay | Admitting: Endocrinology

## 2016-01-21 IMAGING — CT CT CHEST W/O CM
2 of 3 series · 15 of 36 positions shown, 18 images · non-contrast
Comparison: Chest radiograph February 14, 2015

CLINICAL DATA: Pulmonary nodule

EXAM:
CT CHEST WITHOUT CONTRAST
TECHNIQUE: Multidetector CT imaging of the chest was performed following the
standard protocol without IV contrast.

[Series 2: chestroutine 5.0 b40f · axial · 0.77mm/px · z∈[-386,-80]mm · 12 of 73 slices shown, 15 images]
[im 6/73  mediastinal]
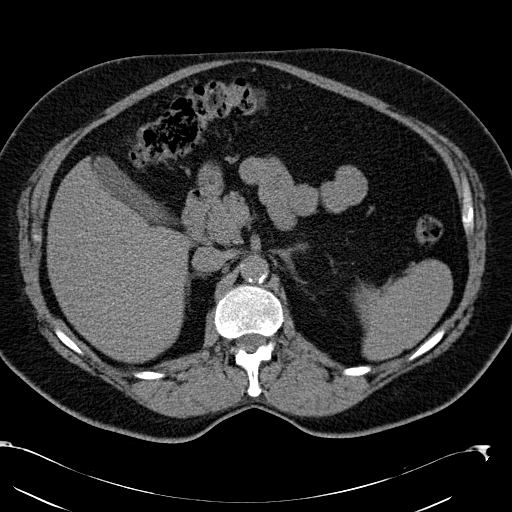
[im 6/73  lung]
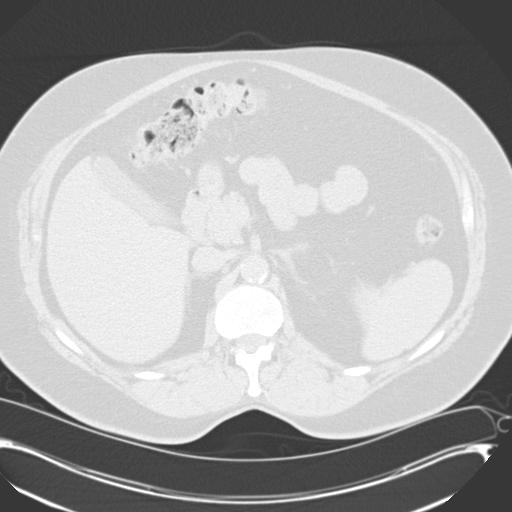
[im 11/73  lung]
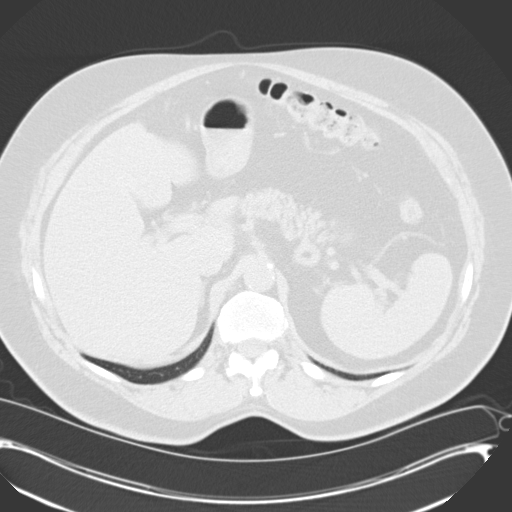
[im 17/73  lung]
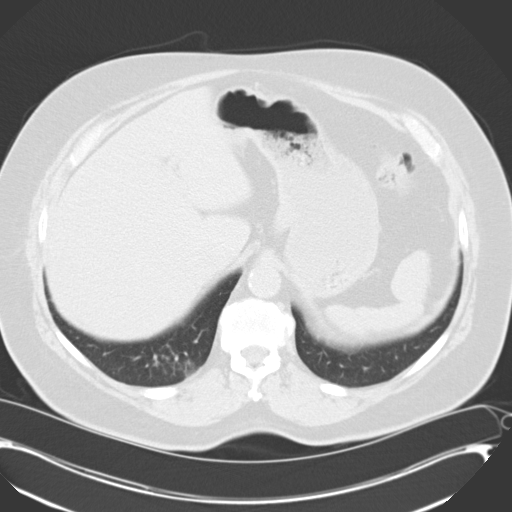
[im 22/73  lung]
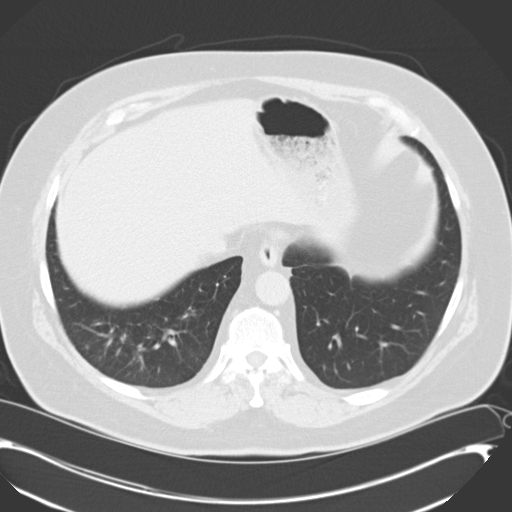
[im 27/73  mediastinal]
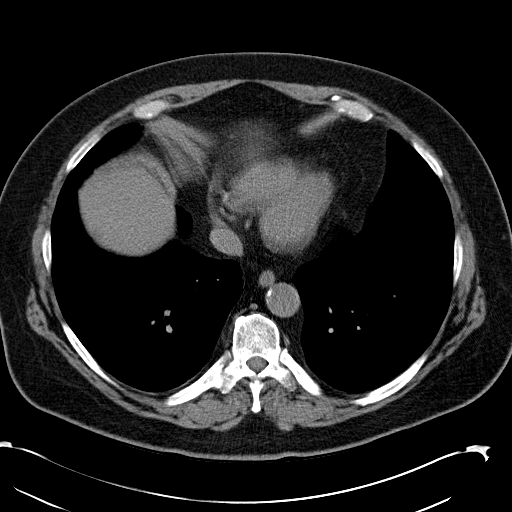
[im 27/73  lung]
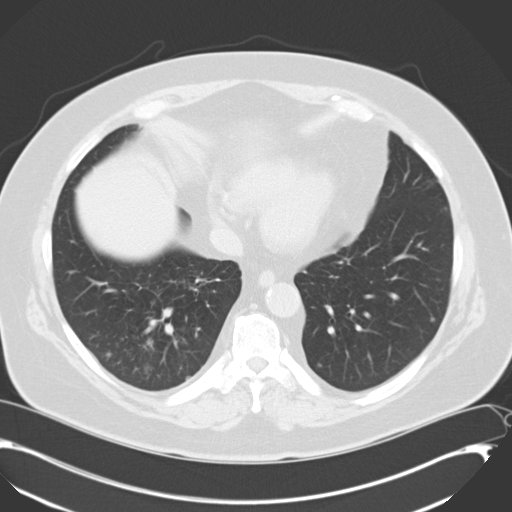
[im 33/73  lung]
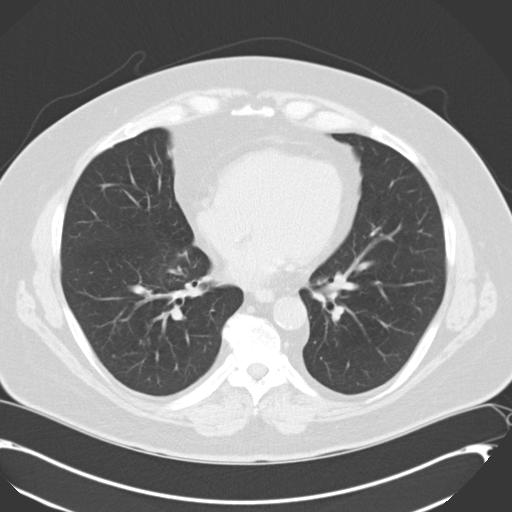
[im 41/73  lung]
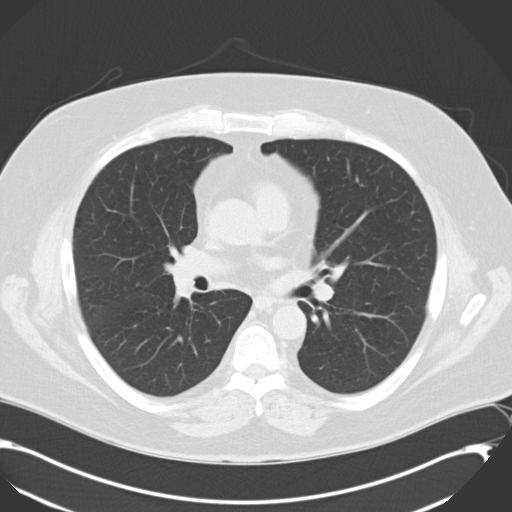
[im 46/73  lung]
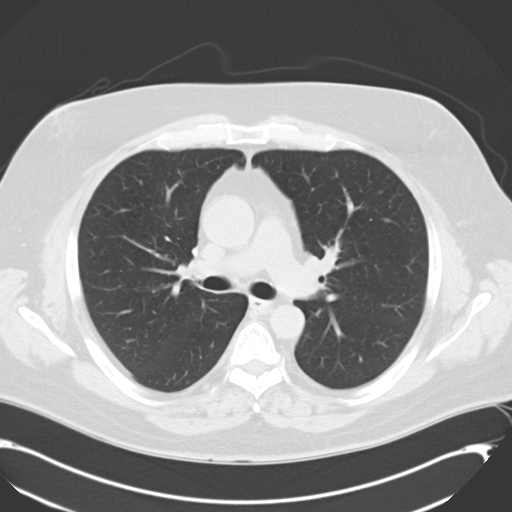
[im 51/73  mediastinal]
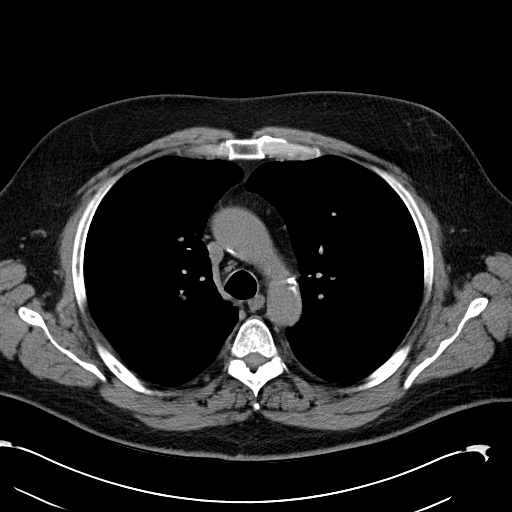
[im 51/73  lung]
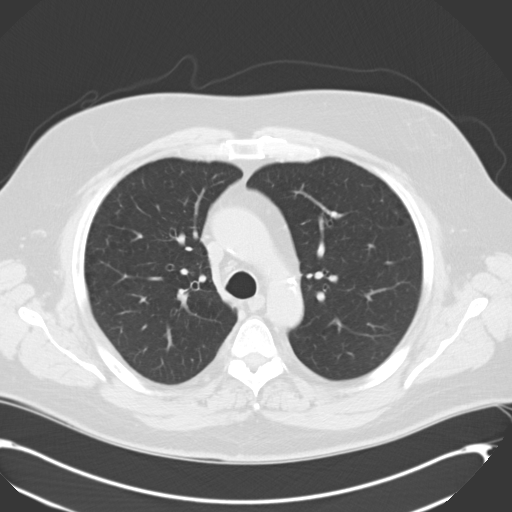
[im 57/73  lung]
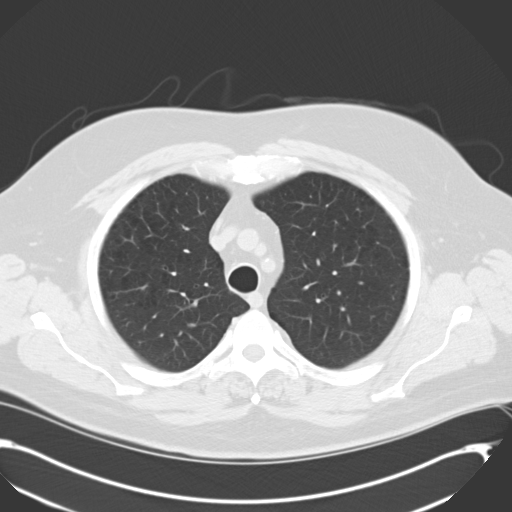
[im 62/73  lung]
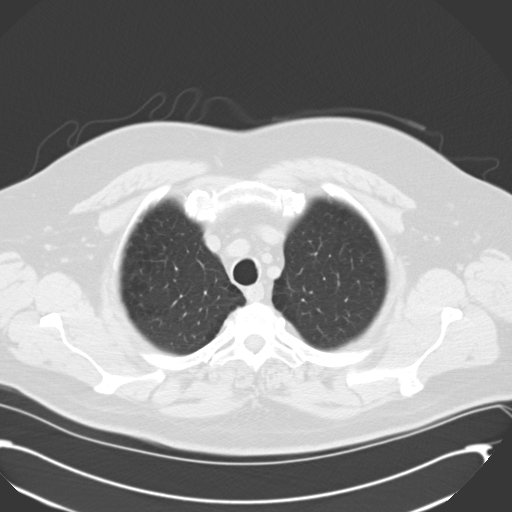
[im 67/73  lung]
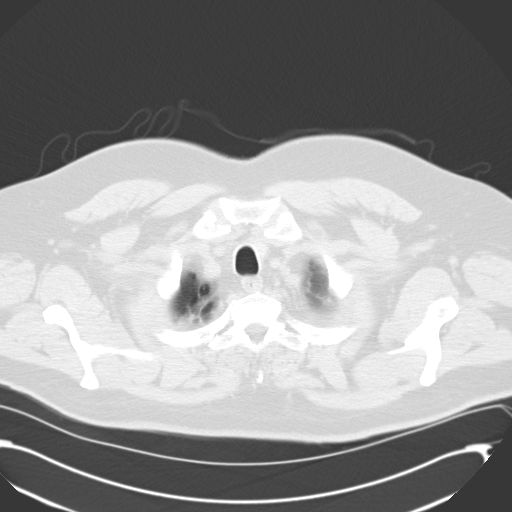

[Series 4: mpr coro 3mm · coronal · 0.72mm/px · 3 of 95 slices shown]
[im 19/95  lung]
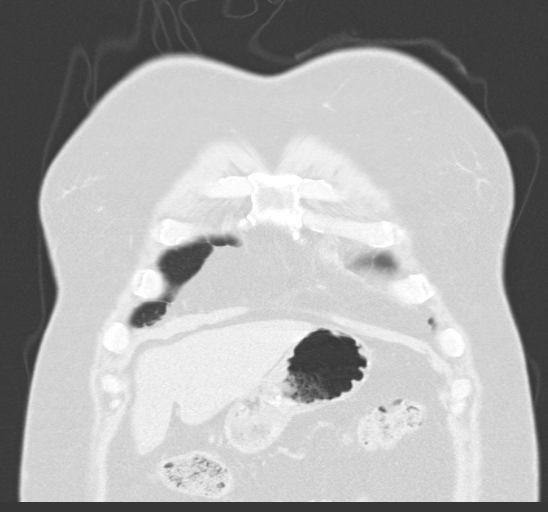
[im 38/95  lung]
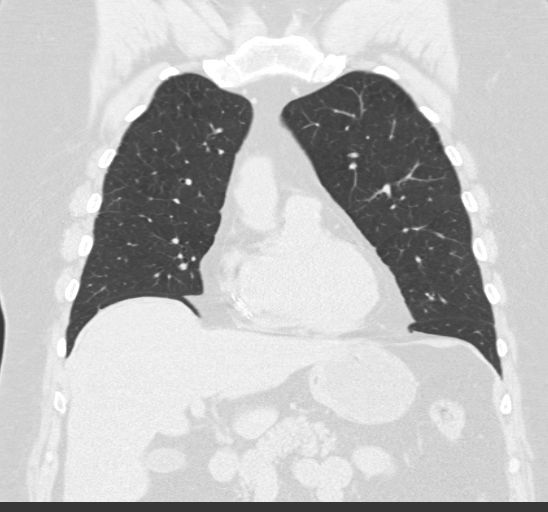
[im 57/95  lung]
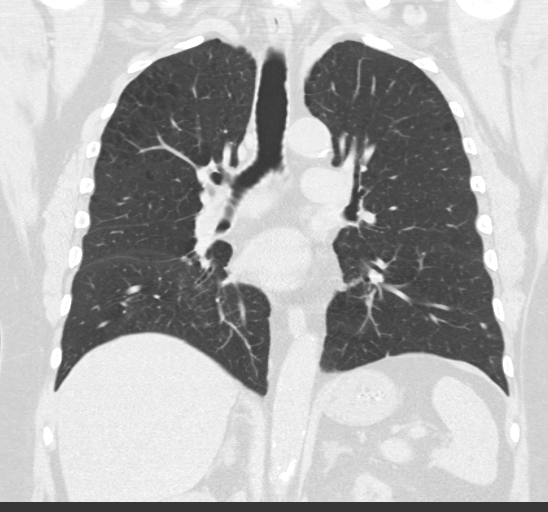

[15 of 36 positions shown; findings below may reference images not displayed]

FINDINGS: There is focal tree-in-bud type opacity in the right lower lobe
involving portions of the posterior and lateral segments as well as
areas of focal patchy consolidation consistent with pneumonia in the
right lower lobe. On axial slice 26 series 3, there is a 3 mm
nodular opacity in the anterior segment right upper lobe which is
felt to correspond to the small nodular opacity seen on the chest
radiograph. On axial slice 40 series 3, there is a 4 mm nodular
opacity at the level of the minor fissure on the right. No larger
nodular opacities are identified.

Visualized thyroid appears normal. There are a few subcentimeter
mediastinal lymph nodes but no adenopathy by size criteria. There is
atherosclerotic change in aorta but no aneurysm. Pericardium is not
thickened. There are scattered foci of coronary artery
calcification.

In the visualized upper abdomen, there is atherosclerotic change in
aorta. Visualize left kidney appears hypoplastic. The there are no
blastic or lytic bone lesions. There is degenerative change in the
thoracic spine.
IMPRESSION: Patchy right lower lobe pneumonia. Small nodular opacities in the
right upper lobe and minor fissure level, largest measuring 4 mm.
Followup of these nodular opacity should be based on [place]
Society guidelines. If the patient is at high risk for bronchogenic
carcinoma, follow-up chest CT at 1 year is recommended. If the
patient is at low risk, no follow-up is needed. This recommendation
follows the consensus statement: Guidelines for Management of Small
Pulmonary Nodules Detected on CT Scans: A Statement from the

No adenopathy. Foci of coronary artery calcification noted.
Incomplete visualization of atrophic left kidney.

## 2016-01-23 ENCOUNTER — Other Ambulatory Visit: Payer: Self-pay | Admitting: Endocrinology

## 2016-02-04 ENCOUNTER — Other Ambulatory Visit: Payer: Self-pay | Admitting: *Deleted

## 2016-02-04 MED ORDER — SIMVASTATIN 40 MG PO TABS: 40.0000 mg | ORAL_TABLET | Freq: Every day | ORAL | Status: DC

## 2016-02-07 ENCOUNTER — Ambulatory Visit: Payer: BLUE CROSS/BLUE SHIELD | Admitting: Endocrinology

## 2016-02-10 ENCOUNTER — Ambulatory Visit (INDEPENDENT_AMBULATORY_CARE_PROVIDER_SITE_OTHER): Payer: BLUE CROSS/BLUE SHIELD | Admitting: Endocrinology

## 2016-02-10 ENCOUNTER — Encounter: Payer: Self-pay | Admitting: *Deleted

## 2016-02-10 ENCOUNTER — Encounter: Payer: Self-pay | Admitting: Endocrinology

## 2016-02-10 ENCOUNTER — Ambulatory Visit (INDEPENDENT_AMBULATORY_CARE_PROVIDER_SITE_OTHER): Payer: BLUE CROSS/BLUE SHIELD | Admitting: Cardiology

## 2016-02-10 ENCOUNTER — Encounter: Payer: Self-pay | Admitting: Cardiology

## 2016-02-10 VITALS — BP 138/86 | HR 84 | Temp 97.8°F | Ht 71.5 in | Wt 267.0 lb

## 2016-02-10 VITALS — BP 133/81 | HR 66 | Ht 72.0 in | Wt 268.4 lb

## 2016-02-10 DIAGNOSIS — E1159 Type 2 diabetes mellitus with other circulatory complications: Secondary | ICD-10-CM

## 2016-02-10 DIAGNOSIS — Z0189 Encounter for other specified special examinations: Secondary | ICD-10-CM | POA: Diagnosis not present

## 2016-02-10 DIAGNOSIS — E1151 Type 2 diabetes mellitus with diabetic peripheral angiopathy without gangrene: Secondary | ICD-10-CM | POA: Diagnosis not present

## 2016-02-10 DIAGNOSIS — I10 Essential (primary) hypertension: Secondary | ICD-10-CM

## 2016-02-10 DIAGNOSIS — E782 Mixed hyperlipidemia: Secondary | ICD-10-CM | POA: Diagnosis not present

## 2016-02-10 DIAGNOSIS — I2510 Atherosclerotic heart disease of native coronary artery without angina pectoris: Secondary | ICD-10-CM | POA: Diagnosis not present

## 2016-02-10 DIAGNOSIS — Z0000 Encounter for general adult medical examination without abnormal findings: Secondary | ICD-10-CM | POA: Diagnosis not present

## 2016-02-10 DIAGNOSIS — Z23 Encounter for immunization: Secondary | ICD-10-CM

## 2016-02-10 LAB — URINALYSIS, ROUTINE W REFLEX MICROSCOPIC
Bilirubin Urine: NEGATIVE
Hgb urine dipstick: NEGATIVE
Ketones, ur: NEGATIVE
LEUKOCYTES UA: NEGATIVE
Nitrite: NEGATIVE
PH: 5 (ref 5.0–8.0)
Specific Gravity, Urine: 1.015 (ref 1.000–1.030)
TOTAL PROTEIN, URINE-UPE24: NEGATIVE
Urine Glucose: >=1000 — AB
Urobilinogen, UA: 0.2 (ref 0.0–1.0)

## 2016-02-10 LAB — CBC WITH DIFFERENTIAL/PLATELET
BASOS ABS: 0 10*3/uL (ref 0.0–0.1)
Basophils Relative: 0.4 % (ref 0.0–3.0)
Eosinophils Absolute: 0.2 10*3/uL (ref 0.0–0.7)
Eosinophils Relative: 2.3 % (ref 0.0–5.0)
HCT: 46.6 % (ref 39.0–52.0)
Hemoglobin: 16 g/dL (ref 13.0–17.0)
LYMPHS ABS: 2 10*3/uL (ref 0.7–4.0)
Lymphocytes Relative: 31.5 % (ref 12.0–46.0)
MCHC: 34.3 g/dL (ref 30.0–36.0)
MCV: 90.3 fl (ref 78.0–100.0)
MONO ABS: 0.5 10*3/uL (ref 0.1–1.0)
MONOS PCT: 7.2 % (ref 3.0–12.0)
NEUTROS ABS: 3.8 10*3/uL (ref 1.4–7.7)
NEUTROS PCT: 58.6 % (ref 43.0–77.0)
PLATELETS: 152 10*3/uL (ref 150.0–400.0)
RBC: 5.15 Mil/uL (ref 4.22–5.81)
RDW: 13.5 % (ref 11.5–15.5)
WBC: 6.5 10*3/uL (ref 4.0–10.5)

## 2016-02-10 LAB — BASIC METABOLIC PANEL
BUN: 23 mg/dL (ref 6–23)
CALCIUM: 9.9 mg/dL (ref 8.4–10.5)
CO2: 27 mEq/L (ref 19–32)
CREATININE: 1.09 mg/dL (ref 0.40–1.50)
Chloride: 105 mEq/L (ref 96–112)
GFR: 72.94 mL/min (ref >60.00)
GLUCOSE: 166 mg/dL — AB (ref 70–99)
Potassium: 4.6 mEq/L (ref 3.5–5.1)
Sodium: 139 mEq/L (ref 135–145)

## 2016-02-10 LAB — MICROALBUMIN / CREATININE URINE RATIO
Creatinine,U: 88.4 mg/dL
Microalb Creat Ratio: 0.8 mg/g (ref 0.0–30.0)

## 2016-02-10 LAB — HEPATIC FUNCTION PANEL
ALK PHOS: 52 U/L (ref 39–117)
ALT: 33 U/L (ref 0–53)
AST: 23 U/L (ref 0–37)
Albumin: 4.3 g/dL (ref 3.5–5.2)
BILIRUBIN DIRECT: 0.1 mg/dL (ref 0.0–0.3)
TOTAL PROTEIN: 6.9 g/dL (ref 6.0–8.3)
Total Bilirubin: 0.6 mg/dL (ref 0.2–1.2)

## 2016-02-10 LAB — LIPID PANEL
Cholesterol: 171 mg/dL (ref 0–200)
HDL: 48 mg/dL (ref >39.00)
NONHDL: 122.71
Total CHOL/HDL Ratio: 4
Triglycerides: 215 mg/dL — ABNORMAL HIGH (ref 0.0–149.0)
VLDL: 43 mg/dL — AB (ref 0.0–40.0)

## 2016-02-10 LAB — TSH: TSH: 1.27 u[IU]/mL (ref 0.35–4.50)

## 2016-02-10 LAB — POCT GLYCOSYLATED HEMOGLOBIN (HGB A1C): Hemoglobin A1C: 6.6

## 2016-02-10 LAB — LDL CHOLESTEROL, DIRECT: Direct LDL: 93 mg/dL

## 2016-02-10 NOTE — Patient Instructions (Addendum)
Please continue the same medications. If your blood sugar is higher, the next step is to change the januvia to a non-insulin injection. please consider these measures for your health:  minimize alcohol.  do not use tobacco products.  have a colonoscopy at least every 10 years from age 62.  keep firearms safely stored.  always use seat belts.  have working smoke alarms in your home.  see an eye doctor and dentist regularly.  never drive under the influence of alcohol or drugs (including prescription drugs).  those with fair skin should take precautions against the sun. it is critically important to prevent falling down (keep floor areas well-lit, dry, and free of loose objects.  If you have a cane, walker, or wheelchair, you should use it, even for short trips around the house.  Wear flat-soled shoes.  Also, try not to rush).   Please come back for a follow-up appointment in 6 months.

## 2016-02-10 NOTE — Progress Notes (Signed)
Subjective:    Patient ID: [name] [name], male    DOB: Apr 20, 1956, 60 y.o.   MRN: [id number]  HPI Pt is here for regular wellness examination, and is feeling pretty well in general, and says chronic med probs are stable, except as noted below Past Medical History  Diagnosis Date  . Coronary atherosclerosis of native coronary artery     DES RCA 10/05  . Stroke [facility])     Left pons 6/11 - Aggrenox started  . Hyperlipidemia   . Type 2 diabetes mellitus ([place])   . Cholelithiasis   . Testicular cancer ([place]) 1978  . Essential hypertension   . OA (osteoarthritis)   . Urolithiasis   . Arthritis     Past Surgical History  Procedure Laterality Date  . Testicular tumor resection      Right side  . Angioplasty      with 1 stent    Social History   Social History  . Marital Status: Married    Spouse Name: N/A  . Number of Children: 0  . Years of Education: N/A   Occupational History  . Truck driver    Social History Main Topics  . Smoking status: Former Smoker -- 1.50 packs/day for 43 years    Types: Cigarettes    Quit date: 04/21/2010  . Smokeless tobacco: Never Used  . Alcohol Use: 0.0 oz/week    0 Standard drinks or equivalent per week     Comment: "Not much..1 (6 pack) a year"  . Drug Use: No  . Sexual Activity: Not on file   Other Topics Concern  . Not on file   Social History Narrative    Current Outpatient Prescriptions on File Prior to Visit  Medication Sig Dispense Refill  . bromocriptine (PARLODEL) 2.5 MG tablet TAKE ONE-HALF (1/2) TABLET (1.25 MG TOTAL) AT BEDTIME 45 tablet 2  . dipyridamole-aspirin (AGGRENOX) 200-25 MG 12hr capsule Take 1 capsule twice a day 180 capsule 0  . fish oil-omega-3 fatty acids 1000 MG capsule Take 1 capsule by mouth 2 (two) times daily.     . INVOKANA 300 MG TABS tablet Take 1 tablet daily 90 tablet 1  . loperamide (IMODIUM) 2 MG capsule Take 2 mg by mouth as needed for diarrhea or loose stools.    . metFORMIN  (GLUCOPHAGE-XR) 500 MG 24 hr tablet Take 2 tablets (1,000 mg total) by mouth daily. 180 tablet 3  . metroNIDAZOLE (FLAGYL) 250 MG tablet Take 1 tablet (250 mg total) by mouth 4 (four) times daily. 56 tablet 0  . Multiple Vitamin (MULTIVITAMIN) tablet Take 1 tablet by mouth daily.    . naproxen sodium (ANAPROX) 275 MG tablet Take 1 tablet (275 mg total) by mouth every morning. 90 tablet 3  . nitroGLYCERIN (NITROSTAT) 0.4 MG SL tablet Place 1 tablet (0.4 mg total) under the tongue every 5 (five) minutes x 3 doses as needed. May repeat every 5 minutes for up to 3 doses 75 tablet 3  . omeprazole (PRILOSEC) 40 MG capsule Take 1 capsule (40 mg total) by mouth daily. 90 capsule 3  . pioglitazone (ACTOS) 30 MG tablet Take 1 tablet (30 mg total) by mouth daily. 90 tablet 2  . saccharomyces boulardii (FLORASTOR) 250 MG capsule Take 1 capsule (250 mg total) by mouth 2 (two) times daily. 60 capsule 0  . simvastatin (ZOCOR) 40 MG tablet Take 1 tablet (40 mg total) by mouth at bedtime. 90 tablet 0  . sitaGLIPtin (JANUVIA) 100 MG  tablet Take 1 tablet (100 mg total) by mouth daily. 90 tablet 1  . WELCHOL 625 MG tablet Take 3 tablets by mouth  twice a day with meals 540 tablet 2   No current facility-administered medications on file prior to visit.    No Known Allergies  Family History  Problem Relation Age of Onset  . Heart disease Father   . Diabetes Father   . Heart attack Father     BP 138/86 mmHg  Pulse 84  Temp(Src) 97.8 F (36.6 C) (Oral)  Ht 5' 11.5" (1.816 m)  Wt 267 lb (121.11 kg)  BMI 36.72 kg/m2  SpO2 95%   Review of Systems  Constitutional: Negative for fever.  HENT: Negative for hearing loss.   Eyes: Negative for visual disturbance.  Respiratory: Negative for shortness of breath.   Cardiovascular: Negative for chest pain.  Gastrointestinal: Negative for anal bleeding.  Endocrine: Negative for cold intolerance.  Genitourinary: Negative for hematuria and difficulty urinating.    Musculoskeletal: Negative for back pain.  Skin: Negative for rash.  Allergic/Immunologic: Negative for environmental allergies.  Neurological: Negative for syncope.  Hematological: Bruises/bleeds easily.  Psychiatric/Behavioral: Negative for dysphoric mood.       Objective:   Physical Exam VS: see vs page GEN: no distress HEAD: head: no deformity eyes: no periorbital swelling, no proptosis external nose and ears are normal mouth: no lesion seen NECK: supple, thyroid is not enlarged CHEST WALL: no deformity LUNGS: clear to auscultation BREASTS:  No gynecomastia CV: reg rate and rhythm, no murmur ABD: abdomen is soft, nontender.  no hepatosplenomegaly.  not distended.  no hernia RECTAL: normal external and internal exam.  heme neg. PROSTATE:  Normal size.  No nodule MUSCULOSKELETAL: muscle bulk and strength are grossly normal.  no obvious joint swelling.  gait is normal and steady EXTEMITIES: no deformity.  no ulcer on the feet.  feet are of normal color and temp.  no edema PULSES: dorsalis pedis intact bilat.  no carotid bruit NEURO:  cn 2-12 grossly intact.   readily moves all 4's.  sensation is intact to touch on the feet SKIN:  Normal texture and temperature.  No rash or suspicious lesion is visible.   NODES:  None palpable at the neck PSYCH: alert, well-oriented.  Does not appear anxious nor depressed.      Assessment & Plan:  Wellness visit today, with problems stable, except as noted.

## 2016-02-10 NOTE — Progress Notes (Signed)
we discussed code status.  pt requests full code, but would not want to be started or maintained on artificial life-support measures if there was not a reasonable chance of recovery 

## 2016-02-10 NOTE — Progress Notes (Signed)
Cardiology Office Note  Date: 02/10/2016   ID: [name] [name], DOB 10-Jun-1956, MRN [id number]  PCP: Renato Shin, MD  Primary Cardiologist: [name], MD   Chief Complaint  Patient presents with  . Coronary Artery Disease    History of Present Illness: [name] [name] is a 60 y.o. male last seen in April 2016. 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 He continues to work as a Administrator. He does not report any angina symptoms or nitroglycerin use. Last stress test was in 2014 as outlined below.  I reviewed his medications. Cardiac regimen includes Zocor, Aggrenox, as needed nitroglycerin, and omega-3 supplements.  He had a recent visit with Dr. [name], follow-up lab work is pending.  Past Medical History  Diagnosis Date  . Coronary atherosclerosis of native coronary artery     DES RCA 10/05  . Stroke [facility])     Left pons 6/11 - Aggrenox started  . Hyperlipidemia   . Type 2 diabetes mellitus ([place])   . Cholelithiasis   . Testicular cancer ([place]) 1978  . Essential hypertension   . OA (osteoarthritis)   . Urolithiasis   . Arthritis     Past Surgical History  Procedure Laterality Date  . Testicular tumor resection      Right side  . Angioplasty      with 1 stent    Current Outpatient Prescriptions  Medication Sig Dispense Refill  . bromocriptine (PARLODEL) 2.5 MG tablet TAKE ONE-HALF (1/2) TABLET (1.25 MG TOTAL) AT BEDTIME 45 tablet 2  . dipyridamole-aspirin (AGGRENOX) 200-25 MG 12hr capsule Take 1 capsule twice a day 180 capsule 0  . fish oil-omega-3 fatty acids 1000 MG capsule Take 1 capsule by mouth 2 (two) times daily.     . INVOKANA 300 MG TABS tablet Take 1 tablet daily 90 tablet 1  . loperamide (IMODIUM) 2 MG capsule Take 2 mg by mouth as needed for diarrhea or loose stools.    . metFORMIN (GLUCOPHAGE-XR) 500 MG 24 hr tablet Take 2 tablets (1,000 mg total) by mouth daily. 180 tablet  3  . metroNIDAZOLE (FLAGYL) 250 MG tablet Take 1 tablet (250 mg total) by mouth 4 (four) times daily. 56 tablet 0  . Multiple Vitamin (MULTIVITAMIN) tablet Take 1 tablet by mouth daily.    . naproxen sodium (ANAPROX) 275 MG tablet Take 1 tablet (275 mg total) by mouth every morning. 90 tablet 3  . nitroGLYCERIN (NITROSTAT) 0.4 MG SL tablet Place 1 tablet (0.4 mg total) under the tongue every 5 (five) minutes x 3 doses as needed. May repeat every 5 minutes for up to 3 doses 75 tablet 3  . omeprazole (PRILOSEC) 40 MG capsule Take 1 capsule (40 mg total) by mouth daily. 90 capsule 3  . pioglitazone (ACTOS) 30 MG tablet Take 1 tablet (30 mg total) by mouth daily. 90 tablet 2  . saccharomyces boulardii (FLORASTOR) 250 MG capsule Take 1 capsule (250 mg total) by mouth 2 (two) times daily. 60 capsule 0  . simvastatin (ZOCOR) 40 MG tablet Take 1 tablet (40 mg total) by mouth at bedtime. 90 tablet 0  . sitaGLIPtin (JANUVIA) 100 MG tablet Take 1 tablet (100 mg total) by mouth daily. 90 tablet 1  . WELCHOL 625 MG tablet Take 3 tablets by mouth  twice a day with meals 540 tablet 2   No current facility-administered medications for this visit.   Allergies:  Review of patient's allergies indicates no known allergies.   Social History: The patient  reports that he quit smoking about 5 years ago. His smoking use included Cigarettes. He has a 64.5 pack-year smoking history. He has never used smokeless tobacco. He reports that he drinks alcohol. He reports that he does not use illicit drugs.   ROS:  Please see the history of present illness. Otherwise, complete review of systems is positive for stable arthritic symptoms.  All other systems are reviewed and negative.   Physical Exam: VS:  BP 133/81 mmHg  Pulse 66  Ht 6' (1.829 m)  Wt 268 lb 6.4 oz (121.745 kg)  BMI 36.39 kg/m2  SpO2 98%, BMI Body mass index is 36.39 kg/(m^2).  Wt Readings from Last 3 Encounters:  02/10/16 268 lb 6.4 oz (121.745 kg)    02/10/16 267 lb (121.11 kg)  11/01/15 265 lb 4 oz (120.317 kg)    Obese male in no acute distress.  HEENT: Conjunctiva and lids normal, oropharynx clear.  Neck: Supple, no carotid bruits or thyromegaly.  Lungs: Clear to auscultation, nonlabored.  Cardiac: Regular rate and rhythm, no S3.  Abdomen: Obese, nontender, bowel sounds present.  Skin: Warm and dry.  Musculoskeletal: No kyphosis.  Extremity: No pitting edema, distal pulses 2+.  Neuropsychiatric: Alert and oriented x3, affect appropriate.   ECG: I personally reviewed the prior tracing from 10/26/2015 which showed normal sinus rhythm.  Recent Labwork: 10/26/2015: BUN 19; Creatinine, Ser 1.13; Hemoglobin 16.1; Platelets 161; Potassium 4.4; Sodium 140     Component Value Date/Time   CHOL 147 11/02/2014 1424   TRIG 183.0* 11/02/2014 1424   TRIG 96 07/09/2006 1049   HDL 54.10 11/02/2014 1424   CHOLHDL 3 11/02/2014 1424   CHOLHDL 3.1 CALC 07/09/2006 1049   VLDL 36.6 11/02/2014 1424   LDLCALC 56 11/02/2014 1424   Other Studies Reviewed Today:  Exercise Cardiolite November 2014: No diagnostic ST segment changes at maximum workload 10.4 METs, inferior wall attenuation artifact without scar or ischemia, LVEF 67%.   Assessment and Plan:  1. CAD status post DES to the RCA in 2005. He is due for a follow-up stress test as part of his DOT evaluation. We are arranging an exercise Cardiolite. Last study was in November 2014. No change in current cardiac regimen for now. He does not report any angina symptoms.  2. Type 2 diabetes mellitus, followed by Dr. [name].  3. Hypertension, blood pressure reasonable today. Weight loss would be beneficial.  4. Hyperlipidemia, continues on statin therapy. Last LDL was in the 50s with follow-up lab work pending.  Current medicines were reviewed with the patient today.   Orders Placed This Encounter  Procedures  . NM Myocar Multi W/Spect W/Wall Motion / EF  . Myocardial Perfusion  Imaging    Disposition: FU with me in 1 year.   Signed, Satira Sark, MD, [facility] 02/10/2016 1:53 PM    [facility] [place], [place], [place] [address] Phone: [phone]; Fax: [phone]

## 2016-02-10 NOTE — Patient Instructions (Signed)
Your physician wants you to follow-up in: 1 YEAR WITH DR. MCDOWELL You will receive a reminder letter in the mail two months in advance. If you don't receive a letter, please call our office to schedule the follow-up appointment.  Your physician recommends that you continue on your current medications as directed. Please refer to the Current Medication list given to you today.  Your physician has requested that you have en exercise stress myoview. For further information please visit [email or web address]. Please follow instruction sheet, as given.  Thank you for choosing [facility]!!    

## 2016-02-11 LAB — HIV ANTIBODY (ROUTINE TESTING W REFLEX): HIV: NONREACTIVE

## 2016-02-11 LAB — HEPATITIS C ANTIBODY: HCV AB: NEGATIVE

## 2016-02-12 ENCOUNTER — Other Ambulatory Visit: Payer: Self-pay | Admitting: Endocrinology

## 2016-02-15 ENCOUNTER — Other Ambulatory Visit: Payer: Self-pay | Admitting: Endocrinology

## 2016-02-17 ENCOUNTER — Encounter (HOSPITAL_COMMUNITY): Admission: RE | Admit: 2016-02-17 | Payer: BLUE CROSS/BLUE SHIELD | Source: Ambulatory Visit

## 2016-02-17 ENCOUNTER — Encounter (HOSPITAL_BASED_OUTPATIENT_CLINIC_OR_DEPARTMENT_OTHER)
Admission: RE | Admit: 2016-02-17 | Discharge: 2016-02-17 | Disposition: A | Discharge status: Home / Self Care | Payer: BLUE CROSS/BLUE SHIELD | Source: Ambulatory Visit | Attending: Cardiology | Admitting: Cardiology

## 2016-02-17 ENCOUNTER — Telehealth: Payer: Self-pay | Admitting: *Deleted

## 2016-02-17 ENCOUNTER — Inpatient Hospital Stay (HOSPITAL_COMMUNITY): Admission: RE | Admit: 2016-02-17 | Payer: BLUE CROSS/BLUE SHIELD | Source: Ambulatory Visit

## 2016-02-17 ENCOUNTER — Encounter (HOSPITAL_COMMUNITY)
Admission: RE | Admit: 2016-02-17 | Discharge: 2016-02-17 | Disposition: A | Discharge status: Home / Self Care | Payer: BLUE CROSS/BLUE SHIELD | Source: Ambulatory Visit | Attending: Cardiology | Admitting: Cardiology

## 2016-02-17 ENCOUNTER — Other Ambulatory Visit: Payer: Self-pay | Admitting: Cardiology

## 2016-02-17 ENCOUNTER — Encounter (HOSPITAL_COMMUNITY): Payer: Self-pay

## 2016-02-17 DIAGNOSIS — I2510 Atherosclerotic heart disease of native coronary artery without angina pectoris: Secondary | ICD-10-CM

## 2016-02-17 DIAGNOSIS — I510 Cardiac septal defect, acquired: Secondary | ICD-10-CM | POA: Insufficient documentation

## 2016-02-17 DIAGNOSIS — I25119 Atherosclerotic heart disease of native coronary artery with unspecified angina pectoris: Secondary | ICD-10-CM

## 2016-02-17 LAB — NM MYOCAR MULTI W/SPECT W/WALL MOTION / EF
CHL CUP NUCLEAR SSS: 5
CSEPED: 10 min
CSEPEW: 11.8 METS
CSEPPHR: 146 {beats}/min
Exercise duration (sec): 0 s
LV sys vol: 14 mL
LVDIAVOL: 61 mL (ref 62–150)
MPHR: 159 {beats}/min
NUC STRESS TID: 0.94
Percent HR: 91 %
RATE: 0.36
RPE: 15
Rest HR: 69 {beats}/min
SDS: 4
SRS: 1

## 2016-02-17 MED ORDER — REGADENOSON 0.4 MG/5ML IV SOLN
INTRAVENOUS | Status: DC
Start: 2016-02-17 — End: 2016-02-17
  Filled 2016-02-17: qty 5

## 2016-02-17 MED ORDER — TECHNETIUM TC 99M TETROFOSMIN IV KIT
30.0000 | PACK | Freq: Once | INTRAVENOUS | Status: AC | PRN
  Administered 2016-02-17: 29.5 via INTRAVENOUS

## 2016-02-17 MED ORDER — TECHNETIUM TC 99M TETROFOSMIN IV KIT
10.0000 | PACK | Freq: Once | INTRAVENOUS | Status: AC | PRN
  Administered 2016-02-17: 11 via INTRAVENOUS

## 2016-02-17 MED ORDER — SODIUM CHLORIDE 0.9% FLUSH
INTRAVENOUS | Status: AC
  Administered 2016-02-17: 10 mL via INTRAVENOUS
  Filled 2016-02-17: qty 10

## 2016-02-17 NOTE — Telephone Encounter (Signed)
-----   Message from Satira Sark, MD sent at 02/17/2016  1:22 PM EDT ----- Reviewed study. Low risk findings, suggests stable CAD. Continue medical therapy. Please forward a copy of this appended report to the patient's occupational health physician at [facility] for DOT physical.

## 2016-02-18 DIAGNOSIS — I25119 Atherosclerotic heart disease of native coronary artery with unspecified angina pectoris: Secondary | ICD-10-CM | POA: Diagnosis not present

## 2016-02-20 NOTE — Telephone Encounter (Signed)
Patient informed. Copy sent to Dr. Andree Elk.

## 2016-03-15 ENCOUNTER — Other Ambulatory Visit: Payer: Self-pay | Admitting: Cardiology

## 2016-04-05 ENCOUNTER — Other Ambulatory Visit: Payer: Self-pay | Admitting: Endocrinology

## 2016-04-10 ENCOUNTER — Other Ambulatory Visit: Payer: Self-pay | Admitting: Endocrinology

## 2016-05-27 ENCOUNTER — Other Ambulatory Visit: Payer: Self-pay | Admitting: Endocrinology

## 2016-07-20 ENCOUNTER — Other Ambulatory Visit: Payer: Self-pay

## 2016-07-20 ENCOUNTER — Telehealth: Payer: Self-pay | Admitting: Endocrinology

## 2016-07-20 ENCOUNTER — Telehealth: Payer: Self-pay

## 2016-07-20 ENCOUNTER — Other Ambulatory Visit: Payer: Self-pay | Admitting: Endocrinology

## 2016-07-20 MED ORDER — CELECOXIB 200 MG PO CAPS: 200.0000 mg | ORAL_CAPSULE | Freq: Two times a day (BID) | ORAL | 0 refills | Status: DC

## 2016-07-20 NOTE — Progress Notes (Deleted)
I have sent a prescription to your pharmacy  

## 2016-07-20 NOTE — Telephone Encounter (Signed)
Okay to refill Celebrex? 08/10/16 next appointment with you.

## 2016-07-20 NOTE — Telephone Encounter (Signed)
Pt wife called and requested refills of Pt Celebrex be sent to the [facility].  She stated if there are any problems to just give her a call.

## 2016-07-20 NOTE — Telephone Encounter (Signed)
Please refill prn 

## 2016-07-20 NOTE — Telephone Encounter (Signed)
Celebrex not on patient med list so what dosage would you like for me to order? Please advise

## 2016-07-26 ENCOUNTER — Other Ambulatory Visit: Payer: Self-pay | Admitting: Endocrinology

## 2016-08-02 ENCOUNTER — Other Ambulatory Visit: Payer: Self-pay | Admitting: Endocrinology

## 2016-08-07 ENCOUNTER — Ambulatory Visit: Payer: BLUE CROSS/BLUE SHIELD | Admitting: Endocrinology

## 2016-08-10 ENCOUNTER — Ambulatory Visit: Payer: BLUE CROSS/BLUE SHIELD | Admitting: Endocrinology

## 2016-08-24 ENCOUNTER — Other Ambulatory Visit: Payer: Self-pay | Admitting: Cardiology

## 2016-08-24 MED ORDER — SIMVASTATIN 40 MG PO TABS: 40.0000 mg | ORAL_TABLET | Freq: Every evening | ORAL | 0 refills | Status: DC

## 2016-08-24 NOTE — Telephone Encounter (Signed)
Done

## 2016-08-24 NOTE — Telephone Encounter (Signed)
[name] [name] ordered his simvastatin (ZOCOR) 40 MG tablet   Thru mail order.  Never did receive it.  He is requesting a refill be [facility] , [place], [place] .

## 2016-09-04 ENCOUNTER — Encounter: Payer: Self-pay | Admitting: Endocrinology

## 2016-09-04 ENCOUNTER — Ambulatory Visit (INDEPENDENT_AMBULATORY_CARE_PROVIDER_SITE_OTHER): Payer: BLUE CROSS/BLUE SHIELD | Admitting: Endocrinology

## 2016-09-04 VITALS — BP 122/70 | HR 71 | Ht 72.0 in | Wt 278.0 lb

## 2016-09-04 DIAGNOSIS — E1151 Type 2 diabetes mellitus with diabetic peripheral angiopathy without gangrene: Secondary | ICD-10-CM | POA: Diagnosis not present

## 2016-09-04 LAB — POCT GLYCOSYLATED HEMOGLOBIN (HGB A1C): HEMOGLOBIN A1C: 6.2

## 2016-09-04 NOTE — Patient Instructions (Addendum)
Please continue the same medications. check your blood sugar once a day.  vary the time of day when you check, between before the 3 meals, and at bedtime.  also check if you have symptoms of your blood sugar being too high or too low.  please keep a record of the readings and bring it to your next appointment here (or you can bring the meter itself).  You can write it on any piece of paper.  please call us sooner if your blood sugar goes below 70, or if you have a lot of readings over 200.  Please come back for a regular physical appointment in 6 months (must be after 02/09/17).

## 2016-09-04 NOTE — Progress Notes (Signed)
Subjective:    Patient ID: [name] [name], male    DOB: 02-02-1956, 60 y.o.   MRN: [id number]  HPI Pt returns for f/u of diabetes mellitus: DM type: 2 Dx'ed: [id number] Complications: CAD and CVA Therapy: 6 oral meds DKA: never Severe hypoglycemia: never.   Pancreatitis: never Other: he has never taken insulin; he works as a Pharmacist, community.  Interval history: he says cbg's are in the low-100's.  pt states he feels well in general.   Past Medical History:  Diagnosis Date  . Arthritis   . Cholelithiasis   . Coronary atherosclerosis of native coronary artery    DES RCA 10/05  . Essential hypertension   . Hyperlipidemia   . OA (osteoarthritis)   . Stroke [facility])    Left pons 6/11 - Aggrenox started  . Testicular cancer ([place]) 1978  . Type 2 diabetes mellitus ([place])   . Urolithiasis     Past Surgical History:  Procedure Laterality Date  . ANGIOPLASTY     with 1 stent  . Testicular tumor resection     Right side    Social History   Social History  . Marital status: Married    Spouse name: N/A  . Number of children: 0  . Years of education: N/A   Occupational History  . Truck driver    Social History Main Topics  . Smoking status: Former Smoker    Packs/day: 1.50    Years: 43.00    Types: Cigarettes    Quit date: 04/21/2010  . Smokeless tobacco: Never Used  . Alcohol use 0.0 oz/week     Comment: "Not much..1 (6 pack) a year"  . Drug use: No  . Sexual activity: Not on file   Other Topics Concern  . Not on file   Social History Narrative  . No narrative on file    Current Outpatient Prescriptions on File Prior to Visit  Medication Sig Dispense Refill  . bromocriptine (PARLODEL) 2.5 MG tablet Take one-half (1/2) tablet  at bedtime 45 tablet 2  . celecoxib (CELEBREX) 200 MG capsule TAKE 1 CAPSULE BY MOUTH TWO TIMES DAILY 90 capsule 3  . dipyridamole-aspirin (AGGRENOX) 200-25 MG 12hr capsule Take 1 capsule twice a day 180 capsule 1  . fish oil-omega-3 fatty acids 1000  MG capsule Take 1 capsule by mouth 2 (two) times daily.     . INVOKANA 300 MG TABS tablet Take 1 tablet daily 90 tablet 3  . JANUVIA 100 MG tablet Take 1 tablet by mouth  daily 90 tablet 3  . metFORMIN (GLUCOPHAGE-XR) 500 MG 24 hr tablet TAKE 2 TABLETS BY MOUTH  ONCE A DAY 180 tablet 3  . Multiple Vitamin (MULTIVITAMIN) tablet Take 1 tablet by mouth daily.    . naproxen sodium (ANAPROX) 275 MG tablet Take 1 tablet (275 mg total) by mouth every morning. 90 tablet 3  . nitroGLYCERIN (NITROSTAT) 0.4 MG SL tablet Place 1 tablet (0.4 mg total) under the tongue every 5 (five) minutes x 3 doses as needed. May repeat every 5 minutes for up to 3 doses 75 tablet 3  . pioglitazone (ACTOS) 30 MG tablet Take 1 tablet daily 90 tablet 3  . simvastatin (ZOCOR) 40 MG tablet Take 1 tablet (40 mg total) by mouth every evening. 90 tablet 0  . WELCHOL 625 MG tablet Take 3 tablets by mouth  twice a day with meals 540 tablet 2   No current facility-administered medications on file prior to visit.  No Known Allergies  Family History  Problem Relation Age of Onset  . Heart disease Father   . Diabetes Father   . Heart attack Father     BP 122/70   Pulse 71   Ht 6' (1.829 m)   Wt 278 lb (126.1 kg)   SpO2 97%   BMI 37.70 kg/m   Review of Systems He denies hypoglycemia    Objective:   Physical Exam VITAL SIGNS:  See vs page GENERAL: no distress Pulses: dorsalis pedis intact bilat.   MSK: no deformity of the feet CV: no leg edema Skin:  no ulcer on the feet.  normal color and temp on the feet. Neuro: sensation is intact to touch on the feet.    A1c=6.2%     Assessment & Plan:  Type 2 DM: well-controlled Patient is advised the following: Patient Instructions  Please continue the same medications. check your blood sugar once a day.  vary the time of day when you check, between before the 3 meals, and at bedtime.  also check if you have symptoms of your blood sugar being too high or too low.   please keep a record of the readings and bring it to your next appointment here (or you can bring the meter itself).  You can write it on any piece of paper.  please call us sooner if your blood sugar goes below 70, or if you have a lot of readings over 200.  Please come back for a regular physical appointment in 6 months (must be after 02/09/17).

## 2016-09-27 ENCOUNTER — Other Ambulatory Visit: Payer: Self-pay | Admitting: Endocrinology

## 2016-11-23 ENCOUNTER — Other Ambulatory Visit: Payer: Self-pay | Admitting: Endocrinology

## 2016-11-30 ENCOUNTER — Telehealth: Payer: Self-pay | Admitting: Endocrinology

## 2016-11-30 MED ORDER — COLESTIPOL HCL 1 G PO TABS: 3.0000 g | ORAL_TABLET | Freq: Every day | ORAL | 11 refills | Status: DC

## 2016-11-30 MED ORDER — EMPAGLIFLOZIN 25 MG PO TABS
25.0000 mg | ORAL_TABLET | Freq: Every day | ORAL | 11 refills | Status: DC
Start: 2016-11-30 — End: 2016-12-01

## 2016-11-30 MED ORDER — LINAGLIPTIN 5 MG PO TABS: 5.0000 mg | ORAL_TABLET | Freq: Every day | ORAL | 11 refills | Status: DC

## 2016-11-30 MED ORDER — DIPYRIDAMOLE 50 MG PO TABS: 50.0000 mg | ORAL_TABLET | Freq: Three times a day (TID) | ORAL | 11 refills | Status: DC

## 2016-11-30 NOTE — Telephone Encounter (Signed)
See message and please advise, Thanks!  

## 2016-11-30 NOTE — Telephone Encounter (Signed)
Pt's wife called in and said that they have had a recent insurance change, and are now with [place] and the scripts go through [facility], and that four of his medications are now $800+ out of pocket.  She would like to know if there are any alternatives for the patient or anything to help her save money with these medications; Welchol Januvia Invokana Aggrenox  She said you may call her back at [phone]

## 2016-11-30 NOTE — Telephone Encounter (Signed)
Ok: Welchol is changed to colestipol Januvia is changed to tradjenta Invokana is changed to jardiance Aggrenox is changed to dipyridimole and aspirin I'll see you next time.

## 2016-12-01 MED ORDER — EMPAGLIFLOZIN 25 MG PO TABS: 25.0000 mg | ORAL_TABLET | Freq: Every day | ORAL | 11 refills | Status: DC

## 2016-12-01 MED ORDER — COLESTIPOL HCL 1 G PO TABS
3.0000 g | ORAL_TABLET | Freq: Every day | ORAL | 11 refills | Status: DC
Start: 2016-12-01 — End: 2017-09-08

## 2016-12-01 MED ORDER — LINAGLIPTIN 5 MG PO TABS: 5.0000 mg | ORAL_TABLET | Freq: Every day | ORAL | 11 refills | Status: DC

## 2016-12-01 NOTE — Telephone Encounter (Signed)
The patient returned your call. °

## 2016-12-01 NOTE — Telephone Encounter (Signed)
I contacted the patient and advised of message via voicemail. Requested a call back if the patient would like to further discuss.  

## 2016-12-01 NOTE — Telephone Encounter (Signed)
I contacted the patient and advised of message. Patient voiced understanding.  

## 2016-12-15 ENCOUNTER — Telehealth: Payer: Self-pay | Admitting: Endocrinology

## 2016-12-15 NOTE — Telephone Encounter (Addendum)
Refill for Invokana and Celebrex   [facility]  [id number] [place] [place] [facility] [place] [phone] (Phone) [phone] (Fax)

## 2016-12-17 MED ORDER — CELECOXIB 200 MG PO CAPS: ORAL_CAPSULE | ORAL | 3 refills | Status: DC

## 2016-12-17 NOTE — Telephone Encounter (Signed)
Refill submitted for Celebrex. Please advise if ok to send Invokana. Rx is not on current med list, thanks!

## 2016-12-17 NOTE — Telephone Encounter (Signed)
Invokana was changed to jardiance, because of insurance.

## 2016-12-18 NOTE — Telephone Encounter (Signed)
I contacted the patient and advised of message. He voiced understanding.

## 2016-12-29 ENCOUNTER — Other Ambulatory Visit: Payer: Self-pay

## 2016-12-29 MED ORDER — LINAGLIPTIN 5 MG PO TABS: 5.0000 mg | ORAL_TABLET | Freq: Every day | ORAL | 11 refills | Status: DC

## 2017-02-18 ENCOUNTER — Telehealth: Payer: Self-pay | Admitting: Endocrinology

## 2017-02-18 NOTE — Telephone Encounter (Signed)
Pt insurance is changing so now the mail order pharmacy is changing the rxs we rx [facility]  [facility] 30 day supply of jardiance because he is going to be out in [facility] days and [facility] PA [phone]

## 2017-02-18 NOTE — Telephone Encounter (Signed)
Please refill meds x 30 days--fine with me.  What is alternative to jardiance?

## 2017-02-19 ENCOUNTER — Ambulatory Visit (INDEPENDENT_AMBULATORY_CARE_PROVIDER_SITE_OTHER): Payer: Managed Care, Other (non HMO) | Admitting: Cardiology

## 2017-02-19 ENCOUNTER — Encounter: Payer: Self-pay | Admitting: Cardiology

## 2017-02-19 VITALS — BP 140/70 | HR 72 | Ht 72.0 in | Wt 279.0 lb

## 2017-02-19 DIAGNOSIS — I10 Essential (primary) hypertension: Secondary | ICD-10-CM

## 2017-02-19 DIAGNOSIS — E1159 Type 2 diabetes mellitus with other circulatory complications: Secondary | ICD-10-CM

## 2017-02-19 DIAGNOSIS — E782 Mixed hyperlipidemia: Secondary | ICD-10-CM

## 2017-02-19 DIAGNOSIS — I2510 Atherosclerotic heart disease of native coronary artery without angina pectoris: Secondary | ICD-10-CM | POA: Diagnosis not present

## 2017-02-19 NOTE — Patient Instructions (Signed)

## 2017-02-19 NOTE — Progress Notes (Signed)
Cardiology Office Note  Date: 02/19/2017   ID: THORNTON DOHRMANN, DOB 1956/06/13, MRN [id number]  PCP: Renato Shin, MD  Primary Cardiologist: [name], MD   Chief Complaint  Patient presents with  . Coronary Artery Disease    History of Present Illness: [name] [name] is a 61 y.o. male last seen in May 2017. He presents for a routine follow-up visit. Reports no angina symptoms or nitroglycerin use. 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 He is working full time at this point.  I reviewed his medications which are outlined below. Current cardiac regimen includes Zocor, omega-3 supplements, and as needed nitroglycerin.  He continues to follow with Dr. [name], last hemoglobin A1c was 6.2 in December of last year.  Follow-up Myoview was obtained last year, results outlined below and low risk overall.  Past Medical History:  Diagnosis Date  . Arthritis   . Cholelithiasis   . Coronary atherosclerosis of native coronary artery    DES RCA 10/05  . Essential hypertension   . Hyperlipidemia   . OA (osteoarthritis)   . Stroke [facility])    Left pons 6/11 - Aggrenox started  . Testicular cancer ([place]) 1978  . Type 2 diabetes mellitus ([place])   . Urolithiasis     Past Surgical History:  Procedure Laterality Date  . Testicular tumor resection     Right side    Current Outpatient Prescriptions  Medication Sig Dispense Refill  . bromocriptine (PARLODEL) 2.5 MG tablet TAKE ONE-HALF (1/2) TABLET  AT BEDTIME 45 tablet 2  . celecoxib (CELEBREX) 200 MG capsule TAKE 1 CAPSULE BY MOUTH TWO TIMES DAILY 90 capsule 3  . colestipol (COLESTID) 1 g tablet Take 3 tablets (3 g total) by mouth daily. 90 tablet 11  . dipyridamole (PERSANTINE) 50 MG tablet Take 1 tablet (50 mg total) by mouth 3 (three) times daily. 90 tablet 11  . empagliflozin (JARDIANCE) 25 MG TABS tablet Take 25 mg by mouth daily. 30 tablet 11  . fish oil-omega-3 fatty acids 1000 MG capsule Take  1 capsule by mouth 2 (two) times daily.     Marland Kitchen linagliptin (TRADJENTA) 5 MG TABS tablet Take 1 tablet (5 mg total) by mouth daily. 30 tablet 11  . metFORMIN (GLUCOPHAGE-XR) 500 MG 24 hr tablet TAKE 2 TABLETS BY MOUTH  ONCE A DAY 180 tablet 3  . Multiple Vitamin (MULTIVITAMIN) tablet Take 1 tablet by mouth daily.    . naproxen sodium (ANAPROX) 275 MG tablet Take 1 tablet (275 mg total) by mouth every morning. 90 tablet 3  . nitroGLYCERIN (NITROSTAT) 0.4 MG SL tablet Place 1 tablet (0.4 mg total) under the tongue every 5 (five) minutes x 3 doses as needed. May repeat every 5 minutes for up to 3 doses 75 tablet 3  . pioglitazone (ACTOS) 30 MG tablet Take 1 tablet daily 90 tablet 3  . simvastatin (ZOCOR) 40 MG tablet Take 1 tablet (40 mg total) by mouth every evening. 90 tablet 0   No current facility-administered medications for this visit.    Allergies:  Patient has no known allergies.   Social History: The patient  reports that he quit smoking about 6 years ago. His smoking use included Cigarettes. He has a 64.50 pack-year smoking history. He has never used smokeless tobacco. He reports that he drinks alcohol. He reports that he does not use drugs.   ROS:  Please see the history of present illness. Otherwise, complete review  of systems is positive for none.  All other systems are reviewed and negative.   Physical Exam: VS:  BP 140/70   Pulse 72   Ht 6' (1.829 m)   Wt 279 lb (126.6 kg)   SpO2 96%   BMI 37.84 kg/m , BMI Body mass index is 37.84 kg/m.  Wt Readings from Last 3 Encounters:  02/19/17 279 lb (126.6 kg)  09/04/16 278 lb (126.1 kg)  02/10/16 268 lb 6.4 oz (121.7 kg)    Obese male in no acute distress.  HEENT: Conjunctiva and lids normal, oropharynx clear.  Neck: Supple, no carotid bruits or thyromegaly.  Lungs: Clear to auscultation, nonlabored.  Cardiac: Regular rate and rhythm, no S3.  Abdomen: Obese, nontender, bowel sounds present.  Skin: Warm and dry.   Musculoskeletal: No kyphosis.  Extremity: No pitting edema, distal pulses 2+.  Neuropsychiatric: Alert and oriented x3, affect appropriate.   ECG: I personally reviewed the tracing from 10/26/2015 which showed normal sinus rhythm.  Recent Labwork:    Component Value Date/Time   CHOL 171 02/10/2016 0921   TRIG 215.0 (H) 02/10/2016 0921   TRIG 96 07/09/2006 1049   HDL 48.00 02/10/2016 0921   CHOLHDL 4 02/10/2016 0921   VLDL 43.0 (H) 02/10/2016 0921   LDLCALC 56 11/02/2014 1424   LDLDIRECT 93.0 02/10/2016 0921    Other Studies Reviewed Today:  Exercise Myoview 02/17/2016:  No diagnostic ST segment changes to indicate ischemia. Low risk Duke treadmill score of 10.  Blood pressure demonstrated a hypertensive response to exercise.  Small, mild intensity, fixed mid to apical inferior defect that is most prominent at rest and most consistent with attenuation. No definite ischemia.  This is a low risk study.  Nuclear stress EF: 78%.  Assessment and Plan:  1. Symptomatically stable CAD status post DES to the RCA in 2005. Follow-up Myoview from last year was low risk. Plan is to continue medical therapy and observation.  2. Type 2 diabetes mellitus, hemoglobin A1c 6.2 in December 2017. Keep follow-up with Dr. [name].  3. Essential hypertension, systolic blood pressure 947S today. Keep follow-up with PCP.  4. Hyperlipidemia, remains on Zocor. Last LDL was in the 90s. We have discussed diet and weight loss as well.  Current medicines were reviewed with the patient today.   Orders Placed This Encounter  Procedures  . EKG 12-Lead    Disposition: Follow-up in one year, sooner if needed.  Signed, Satira Sark, MD, [facility] 02/19/2017 4:13 PM    [facility] [address], [place], [place] [address] Phone: [phone]; Fax: [phone]

## 2017-02-24 ENCOUNTER — Telehealth: Payer: Self-pay | Admitting: Endocrinology

## 2017-02-24 MED ORDER — CELECOXIB 200 MG PO CAPS: ORAL_CAPSULE | ORAL | 2 refills | Status: DC

## 2017-02-24 NOTE — Telephone Encounter (Signed)
Refill clarified for a 90 day supply [facility].

## 2017-02-24 NOTE — Telephone Encounter (Signed)
[facility] needs clarification on the rx for celecoxib (CELEBREX) 200 MG capsule  Reference #: [id number]  Please call number provided to advise.

## 2017-02-24 NOTE — Telephone Encounter (Signed)
Left vm requesting the patient call me back so that I can verify what his new insurance is so that we can order prescriptions

## 2017-02-25 ENCOUNTER — Other Ambulatory Visit: Payer: Self-pay

## 2017-02-25 ENCOUNTER — Telehealth: Payer: Self-pay | Admitting: Endocrinology

## 2017-02-25 MED ORDER — EMPAGLIFLOZIN 25 MG PO TABS: 25.0000 mg | ORAL_TABLET | Freq: Every day | ORAL | 2 refills | Status: DC

## 2017-02-25 MED ORDER — METFORMIN HCL ER 500 MG PO TB24: 1000.0000 mg | ORAL_TABLET | Freq: Every day | ORAL | 3 refills | Status: DC

## 2017-02-25 MED ORDER — NAPROXEN SODIUM 275 MG PO TABS: 275.0000 mg | ORAL_TABLET | ORAL | 3 refills | Status: DC

## 2017-02-25 MED ORDER — BROMOCRIPTINE MESYLATE 2.5 MG PO TABS: ORAL_TABLET | ORAL | 2 refills | Status: DC

## 2017-02-25 MED ORDER — PIOGLITAZONE HCL 30 MG PO TABS: 30.0000 mg | ORAL_TABLET | Freq: Every day | ORAL | 3 refills | Status: DC

## 2017-02-25 MED ORDER — LINAGLIPTIN 5 MG PO TABS: 5.0000 mg | ORAL_TABLET | Freq: Every day | ORAL | 2 refills | Status: DC

## 2017-02-25 NOTE — Telephone Encounter (Signed)
Spoke to wife and I ordered all the diabetic medications to [facility]

## 2017-02-25 NOTE — Telephone Encounter (Signed)
please call patient: I can rx one of the covered alternatives, but you should take prilosec qd also.  OK?

## 2017-02-25 NOTE — Telephone Encounter (Signed)
Patient attempting to return Lisa's call.

## 2017-02-25 NOTE — Telephone Encounter (Signed)
Celecoxib not covered the alternatives are: Naproxen Ibuprofen Meloxicam tabs 7.5mg  or 15mg  Etoolac capsules or tabs and extended release  Do you want to switch or do a PA please advise

## 2017-02-25 NOTE — Telephone Encounter (Signed)
Celecoxib not covered under the patients insurance the alternatives are: Naproxen Ibuprofen Etoolac capsules or tabbs and extended release meloxacam 7.5 mg or 15mg   Patient is willing to try naprozen but I need to know the dosage because the one in patients chart is over the counter strength and I can not order that through [facility]- please advise

## 2017-02-25 NOTE — Telephone Encounter (Signed)
Wife calling to report she has not received any scripts that Dr. [name] prescribed. She says he needs all of the scripts Dr. [name] prescribes for him.  Thank you,  -LL

## 2017-02-25 NOTE — Telephone Encounter (Signed)
Left a vm requesting a call back to let me know which prescriptions his wife has already received so that I know what to order

## 2017-03-02 ENCOUNTER — Encounter: Payer: Self-pay | Admitting: Endocrinology

## 2017-03-03 NOTE — Telephone Encounter (Signed)
Patient is aware 

## 2017-03-04 NOTE — Telephone Encounter (Signed)
Made in Error

## 2017-03-05 ENCOUNTER — Encounter: Payer: Self-pay | Admitting: Endocrinology

## 2017-03-05 ENCOUNTER — Ambulatory Visit (INDEPENDENT_AMBULATORY_CARE_PROVIDER_SITE_OTHER): Payer: Managed Care, Other (non HMO) | Admitting: Endocrinology

## 2017-03-05 VITALS — BP 130/72 | HR 75 | Ht 72.0 in | Wt 276.0 lb

## 2017-03-05 DIAGNOSIS — Z0000 Encounter for general adult medical examination without abnormal findings: Secondary | ICD-10-CM | POA: Diagnosis not present

## 2017-03-05 DIAGNOSIS — Z23 Encounter for immunization: Secondary | ICD-10-CM

## 2017-03-05 DIAGNOSIS — Z125 Encounter for screening for malignant neoplasm of prostate: Secondary | ICD-10-CM

## 2017-03-05 DIAGNOSIS — E1151 Type 2 diabetes mellitus with diabetic peripheral angiopathy without gangrene: Secondary | ICD-10-CM

## 2017-03-05 LAB — HEPATIC FUNCTION PANEL
ALBUMIN: 4.1 g/dL (ref 3.5–5.2)
ALT: 29 U/L (ref 0–53)
AST: 22 U/L (ref 0–37)
Alkaline Phosphatase: 50 U/L (ref 39–117)
Bilirubin, Direct: 0.1 mg/dL (ref 0.0–0.3)
Total Bilirubin: 0.5 mg/dL (ref 0.2–1.2)
Total Protein: 6.4 g/dL (ref 6.0–8.3)

## 2017-03-05 LAB — CBC WITH DIFFERENTIAL/PLATELET
Basophils Absolute: 0 10*3/uL (ref 0.0–0.1)
Basophils Relative: 0.5 % (ref 0.0–3.0)
EOS ABS: 0.1 10*3/uL (ref 0.0–0.7)
Eosinophils Relative: 2.2 % (ref 0.0–5.0)
HCT: 45.9 % (ref 39.0–52.0)
HEMOGLOBIN: 15.6 g/dL (ref 13.0–17.0)
Lymphocytes Relative: 32.8 % (ref 12.0–46.0)
Lymphs Abs: 2.1 10*3/uL (ref 0.7–4.0)
MCHC: 34 g/dL (ref 30.0–36.0)
MCV: 89.7 fl (ref 78.0–100.0)
MONO ABS: 0.4 10*3/uL (ref 0.1–1.0)
Monocytes Relative: 7 % (ref 3.0–12.0)
Neutro Abs: 3.7 10*3/uL (ref 1.4–7.7)
Neutrophils Relative %: 57.5 % (ref 43.0–77.0)
Platelets: 143 10*3/uL — ABNORMAL LOW (ref 150.0–400.0)
RBC: 5.12 Mil/uL (ref 4.22–5.81)
RDW: 13.5 % (ref 11.5–15.5)
WBC: 6.5 10*3/uL (ref 4.0–10.5)

## 2017-03-05 LAB — BASIC METABOLIC PANEL
BUN: 18 mg/dL (ref 6–23)
CALCIUM: 10.1 mg/dL (ref 8.4–10.5)
CO2: 29 mEq/L (ref 19–32)
Chloride: 106 mEq/L (ref 96–112)
Creatinine, Ser: 1.15 mg/dL (ref 0.40–1.50)
GFR: 68.33 mL/min (ref >60.00)
Glucose, Bld: 168 mg/dL — ABNORMAL HIGH (ref 70–99)
Potassium: 4.9 mEq/L (ref 3.5–5.1)
Sodium: 141 mEq/L (ref 135–145)

## 2017-03-05 LAB — PSA: PSA: 0.22 ng/mL (ref 0.10–4.00)

## 2017-03-05 LAB — URINALYSIS, ROUTINE W REFLEX MICROSCOPIC
BILIRUBIN URINE: NEGATIVE
Hgb urine dipstick: NEGATIVE
KETONES UR: NEGATIVE
Leukocytes, UA: NEGATIVE
Nitrite: NEGATIVE
PH: 5.5 (ref 5.0–8.0)
RBC / HPF: NONE SEEN (ref 0–?)
Specific Gravity, Urine: 1.015 (ref 1.000–1.030)
TOTAL PROTEIN, URINE-UPE24: NEGATIVE
UROBILINOGEN UA: 0.2 (ref 0.0–1.0)

## 2017-03-05 LAB — LDL CHOLESTEROL, DIRECT: Direct LDL: 72 mg/dL

## 2017-03-05 LAB — LIPID PANEL
Cholesterol: 152 mg/dL (ref 0–200)
HDL: 54.9 mg/dL (ref >39.00)
NONHDL: 97.1
Total CHOL/HDL Ratio: 3
Triglycerides: 207 mg/dL — ABNORMAL HIGH (ref 0.0–149.0)
VLDL: 41.4 mg/dL — ABNORMAL HIGH (ref 0.0–40.0)

## 2017-03-05 LAB — MICROALBUMIN / CREATININE URINE RATIO
CREATININE, U: 72.7 mg/dL
MICROALB/CREAT RATIO: 1 mg/g (ref 0.0–30.0)

## 2017-03-05 LAB — TSH: TSH: 2.14 u[IU]/mL (ref 0.35–4.50)

## 2017-03-05 LAB — POCT GLYCOSYLATED HEMOGLOBIN (HGB A1C): HEMOGLOBIN A1C: 6.5

## 2017-03-05 NOTE — Patient Instructions (Addendum)
Please consider these measures for your health:  minimize alcohol.  Do not use tobacco products.  Have a colonoscopy at least every 10 years from age 61. Keep firearms safely stored.  Always use seat belts.  have working smoke alarms in your home.  See an eye doctor and dentist regularly.  Never drive under the influence of alcohol or drugs (including prescription drugs).  Those with fair skin should take precautions against the sun, and should carefully examine their skin once per month, for any new or changed moles. blood tests are requested for you today.  We'll let you know about the results.  Please come back for a follow-up appointment in 6 months.    

## 2017-03-05 NOTE — Progress Notes (Signed)
Subjective:    Patient ID: [name] [name], male    DOB: 18-May-1956, 61 y.o.   MRN: [id number]  HPI Pt is here for regular wellness examination, and is feeling pretty well in general, and says chronic med probs are stable, except as noted below Past Medical History:  Diagnosis Date  . Arthritis   . Cholelithiasis   . Coronary atherosclerosis of native coronary artery    DES RCA 10/05  . Essential hypertension   . Hyperlipidemia   . OA (osteoarthritis)   . Stroke [facility])    Left pons 6/11 - Aggrenox started  . Testicular cancer ([place]) 1978  . Type 2 diabetes mellitus ([place])   . Urolithiasis     Past Surgical History:  Procedure Laterality Date  . Testicular tumor resection     Right side    Social History   Social History  . Marital status: Married    Spouse name: N/A  . Number of children: 0  . Years of education: N/A   Occupational History  . Truck driver    Social History Main Topics  . Smoking status: Former Smoker    Packs/day: 1.50    Years: 43.00    Types: Cigarettes    Quit date: 04/21/2010  . Smokeless tobacco: Never Used  . Alcohol use 0.0 oz/week     Comment: "Not much..1 (6 pack) a year"  . Drug use: No  . Sexual activity: Not on file   Other Topics Concern  . Not on file   Social History Narrative  . No narrative on file    Current Outpatient Prescriptions on File Prior to Visit  Medication Sig Dispense Refill  . bromocriptine (PARLODEL) 2.5 MG tablet TAKE ONE-HALF (1/2) TABLET  AT BEDTIME 45 tablet 2  . colestipol (COLESTID) 1 g tablet Take 3 tablets (3 g total) by mouth daily. 90 tablet 11  . dipyridamole (PERSANTINE) 50 MG tablet Take 1 tablet (50 mg total) by mouth 3 (three) times daily. 90 tablet 11  . empagliflozin (JARDIANCE) 25 MG TABS tablet Take 25 mg by mouth daily. 90 tablet 2  . fish oil-omega-3 fatty acids 1000 MG capsule Take 1 capsule by mouth 2 (two) times daily.     Marland Kitchen linagliptin (TRADJENTA) 5 MG TABS tablet Take 1 tablet (5  mg total) by mouth daily. 90 tablet 2  . metFORMIN (GLUCOPHAGE-XR) 500 MG 24 hr tablet Take 2 tablets (1,000 mg total) by mouth daily. 180 tablet 3  . Multiple Vitamin (MULTIVITAMIN) tablet Take 1 tablet by mouth daily.    . naproxen sodium (ANAPROX) 275 MG tablet Take 1 tablet (275 mg total) by mouth every morning. 90 tablet 3  . nitroGLYCERIN (NITROSTAT) 0.4 MG SL tablet Place 1 tablet (0.4 mg total) under the tongue every 5 (five) minutes x 3 doses as needed. May repeat every 5 minutes for up to 3 doses 75 tablet 3  . pioglitazone (ACTOS) 30 MG tablet Take 1 tablet (30 mg total) by mouth daily. 90 tablet 3  . simvastatin (ZOCOR) 40 MG tablet Take 1 tablet (40 mg total) by mouth every evening. 90 tablet 0  . celecoxib (CELEBREX) 200 MG capsule TAKE 1 CAPSULE BY MOUTH TWO TIMES DAILY (Patient not taking: Reported on 03/05/2017) 180 capsule 2   No current facility-administered medications on file prior to visit.     No Known Allergies  Family History  Problem Relation Age of Onset  . Heart disease Father   . Diabetes  Father   . Heart attack Father     BP 130/72   Pulse 75   Ht 6' (1.829 m)   Wt 276 lb (125.2 kg)   SpO2 95%   BMI 37.43 kg/m   Review of Systems Constitutional: Negative for fever.  HENT: Negative for hearing loss.   Eyes: Negative for visual disturbance.  Respiratory: Negative for shortness of breath.   Cardiovascular: Negative for chest pain.  Gastrointestinal: Negative for anal bleeding.  Endocrine: Negative for cold intolerance.  Genitourinary: Negative for hematuria and difficulty urinating.  Musculoskeletal: Negative for back pain.  Skin: Negative for rash.  Allergic/Immunologic: Negative for environmental allergies.  Neurological: Negative for syncope.  Hematological: Bruises/bleeds easily.  Psychiatric/Behavioral: Negative for dysphoric mood.     Objective:   Physical Exam VS: see vs page GEN: no distress HEAD: head: no deformity eyes: no  periorbital swelling, no proptosis external nose and ears are normal mouth: 5 mm nodule at the tip of the tongue (pt says chronic--no change).   NECK: supple, thyroid is not enlarged.  CHEST WALL: no deformity LUNGS: clear to auscultation BREASTS:  No gynecomastia CV: reg rate and rhythm, no murmur ABD: abdomen is soft, nontender.  no hepatosplenomegaly.  not distended.  no hernia RECTAL/PROSTATE: declined MUSCULOSKELETAL: muscle bulk and strength are grossly normal.  no obvious joint swelling.  gait is normal and steady EXTEMITIES: no deformity.  no ulcer on the feet.  feet are of normal color and temp.  Trace bilat leg edema. Ecchymosis of the left great toenail.   PULSES: dorsalis pedis intact bilat.  no carotid bruit NEURO:  cn 2-12 grossly intact.   readily moves all 4's.  sensation is intact to touch on the feet SKIN:  Normal texture and temperature.  No rash or suspicious lesion is visible.   NODES:  None palpable at the neck.   PSYCH: alert, well-oriented.  Does not appear anxious nor depressed.    A1c=6.5%    Assessment & Plan:  Wellness visit today, with problems stable, except as noted.

## 2017-03-05 NOTE — Progress Notes (Signed)
we discussed code status.  pt requests full code, but would not want to be started or maintained on artificial life-support measures if there was not a reasonable chance of recovery 

## 2017-04-14 ENCOUNTER — Other Ambulatory Visit: Payer: Self-pay

## 2017-04-14 MED ORDER — DIPYRIDAMOLE 50 MG PO TABS: 50.0000 mg | ORAL_TABLET | Freq: Three times a day (TID) | ORAL | 5 refills | Status: DC

## 2017-06-20 ENCOUNTER — Other Ambulatory Visit: Payer: Self-pay | Admitting: Cardiology

## 2017-08-30 ENCOUNTER — Ambulatory Visit: Payer: Managed Care, Other (non HMO) | Admitting: Endocrinology

## 2017-08-31 ENCOUNTER — Other Ambulatory Visit: Payer: Self-pay

## 2017-08-31 MED ORDER — DIPYRIDAMOLE 50 MG PO TABS: ORAL_TABLET | ORAL | 3 refills | Status: DC

## 2017-09-08 ENCOUNTER — Other Ambulatory Visit: Payer: Self-pay

## 2017-09-08 MED ORDER — COLESTIPOL HCL 1 G PO TABS
3.0000 g | ORAL_TABLET | Freq: Every day | ORAL | 4 refills | Status: DC
Start: 2017-09-08 — End: 2018-02-28

## 2017-09-08 MED ORDER — PIOGLITAZONE HCL 30 MG PO TABS: 30.0000 mg | ORAL_TABLET | Freq: Every day | ORAL | 3 refills | Status: DC

## 2017-09-13 ENCOUNTER — Telehealth: Payer: Self-pay | Admitting: Endocrinology

## 2017-09-13 NOTE — Telephone Encounter (Signed)
[employer] called to speak with DR or NURSE about authorization for script for PT   Please call  [employer]  [phone]  RF# [phone]

## 2017-09-14 ENCOUNTER — Telehealth: Payer: Self-pay | Admitting: Endocrinology

## 2017-09-14 NOTE — Telephone Encounter (Signed)
[name] (wife) called because insurance company needs preauthorization for [employer]. Please call insurance co ph# [phone] to [place]

## 2017-09-15 NOTE — Telephone Encounter (Signed)
[name] [name]      09/14/17 11:54 AM  Note    [name] (wife) called because insurance company needs preauthorization for [employer]. Please call insurance co ph# [phone] to [place]

## 2017-09-16 ENCOUNTER — Other Ambulatory Visit: Payer: Self-pay

## 2017-09-16 MED ORDER — EMPAGLIFLOZIN 25 MG PO TABS: 25.0000 mg | ORAL_TABLET | Freq: Every day | ORAL | 2 refills | Status: DC

## 2017-09-16 NOTE — Telephone Encounter (Signed)
I called patient's insurance company & he was approved for Jardiance through Dec. 2019 the reference #[id number]. I also called to notify patient of this & sent in new prescription.

## 2017-09-24 DIAGNOSIS — H524 Presbyopia: Secondary | ICD-10-CM | POA: Diagnosis not present

## 2017-10-01 ENCOUNTER — Ambulatory Visit: Payer: BLUE CROSS/BLUE SHIELD | Admitting: Endocrinology

## 2017-10-01 ENCOUNTER — Encounter: Payer: Self-pay | Admitting: Endocrinology

## 2017-10-01 VITALS — BP 132/78 | HR 75 | Wt 278.0 lb

## 2017-10-01 DIAGNOSIS — E1151 Type 2 diabetes mellitus with diabetic peripheral angiopathy without gangrene: Secondary | ICD-10-CM | POA: Diagnosis not present

## 2017-10-01 LAB — POCT GLYCOSYLATED HEMOGLOBIN (HGB A1C): Hemoglobin A1C: 6.7

## 2017-10-01 MED ORDER — DULAGLUTIDE 1.5 MG/0.5ML ~~LOC~~ SOAJ
1.5000 mg | SUBCUTANEOUS | 3 refills | Status: DC
Start: 2017-10-01 — End: 2018-09-29

## 2017-10-01 MED ORDER — PIOGLITAZONE HCL 15 MG PO TABS: 15.0000 mg | ORAL_TABLET | Freq: Every day | ORAL | 3 refills | Status: DC

## 2017-10-01 NOTE — Patient Instructions (Signed)
Please change the tradjenta to "trulicity."   Also, please reduce the pioglitizone to 15 mg daily I have sent prescriptions to your pharmacy, for these. check your blood sugar once a day.  vary the time of day when you check, between before the 3 meals, and at bedtime.  also check if you have symptoms of your blood sugar being too high or too low.  please keep a record of the readings and bring it to your next appointment here (or you can bring the meter itself).  You can write it on any piece of paper.  please call us sooner if your blood sugar goes below 70, or if you have a lot of readings over 200. Please come back for a follow-up appointment in 6 months.

## 2017-10-01 NOTE — Progress Notes (Signed)
Subjective:    Patient ID: [name] [name], male    DOB: [birth year], 62 y.o.   MRN: [id number]  HPI Pt returns for f/u of diabetes mellitus: DM type: 2 Dx'ed: 7322 Complications: CAD and CVA Therapy: 6 oral meds DKA: never Severe hypoglycemia: never.   Pancreatitis: never Other: he has never taken insulin; he works as a Pharmacist, community.  Interval history: he says cbg's are in the low-100's.  pt states he feels well in general.   Past Medical History:  Diagnosis Date  . Arthritis   . Cholelithiasis   . Coronary atherosclerosis of native coronary artery    DES RCA 10/05  . Essential hypertension   . Hyperlipidemia   . OA (osteoarthritis)   . Stroke [facility])    Left pons 6/11 - Aggrenox started  . Testicular cancer ([place]) 1978  . Type 2 diabetes mellitus ([place])   . Urolithiasis     Past Surgical History:  Procedure Laterality Date  . Testicular tumor resection     Right side    Social History   Socioeconomic History  . Marital status: Married    Spouse name: Not on file  . Number of children: 0  . Years of education: Not on file  . Highest education level: Not on file  Social Needs  . Financial resource strain: Not on file  . Food insecurity - worry: Not on file  . Food insecurity - inability: Not on file  . Transportation needs - medical: Not on file  . Transportation needs - non-medical: Not on file  Occupational History  . Occupation: Truck Geophysicist/field seismologist  Tobacco Use  . Smoking status: Former Smoker    Packs/day: 1.50    Years: 43.00    Pack years: 64.50    Types: Cigarettes    Last attempt to quit: 04/21/2010    Years since quitting: 7.4  . Smokeless tobacco: Never Used  Substance and Sexual Activity  . Alcohol use: Yes    Alcohol/week: 0.0 oz    Comment: "Not much..1 (6 pack) a year"  . Drug use: No  . Sexual activity: Not on file  Other Topics Concern  . Not on file  Social History Narrative  . Not on file    Current Outpatient Medications on File Prior to  Visit  Medication Sig Dispense Refill  . bromocriptine (PARLODEL) 2.5 MG tablet TAKE ONE-HALF (1/2) TABLET  AT BEDTIME 45 tablet 2  . colestipol (COLESTID) 1 g tablet Take 3 tablets (3 g total) by mouth daily. 90 tablet 4  . dipyridamole (PERSANTINE) 50 MG tablet Take 1 tablet 3 times daily 270 tablet 3  . empagliflozin (JARDIANCE) 25 MG TABS tablet Take 25 mg by mouth daily. 90 tablet 2  . fish oil-omega-3 fatty acids 1000 MG capsule Take 1 capsule by mouth 2 (two) times daily.     . metFORMIN (GLUCOPHAGE-XR) 500 MG 24 hr tablet Take 2 tablets (1,000 mg total) by mouth daily. 180 tablet 3  . Multiple Vitamin (MULTIVITAMIN) tablet Take 1 tablet by mouth daily.    . naproxen sodium (ANAPROX) 275 MG tablet Take 1 tablet (275 mg total) by mouth every morning. 90 tablet 3  . nitroGLYCERIN (NITROSTAT) 0.4 MG SL tablet Place 1 tablet (0.4 mg total) under the tongue every 5 (five) minutes x 3 doses as needed. May repeat every 5 minutes for up to 3 doses 75 tablet 3  . simvastatin (ZOCOR) 40 MG tablet TAKE 1 TABLET DAILY AT BEDTIME 90  tablet 1   No current facility-administered medications on file prior to visit.     No Known Allergies  Family History  Problem Relation Age of Onset  . Heart disease Father   . Diabetes Father   . Heart attack Father     BP 132/78 (BP Location: Left Arm, Patient Position: Sitting, Cuff Size: Normal)   Pulse 75   Wt 278 lb (126.1 kg)   SpO2 97%   BMI 37.70 kg/m    Review of Systems Denies sob    Objective:   Physical Exam VITAL SIGNS:  See vs page GENERAL: no distress Pulses: foot pulses are intact bilaterally.   MSK: no deformity of the feet or ankles.  CV: 1+ bilat edema of the legs.  Skin:  no ulcer on the feet or ankles.  normal color and temp on the feet and ankles Neuro: sensation is intact to touch on the feet and ankles.     Lab Results  Component Value Date   HGBA1C 6.7 10/01/2017       Assessment & Plan:  Edema, due to  pioglitizone: worse Type 2 DM, with CVA: he needs increased rx, if it can be done with a regimen that avoids or minimizes hypoglycemia.   Patient Instructions  Please change the tradjenta to "trulicity."   Also, please reduce the pioglitizone to 15 mg daily I have sent prescriptions to your pharmacy, for these. check your blood sugar once a day.  vary the time of day when you check, between before the 3 meals, and at bedtime.  also check if you have symptoms of your blood sugar being too high or too low.  please keep a record of the readings and bring it to your next appointment here (or you can bring the meter itself).  You can write it on any piece of paper.  please call us sooner if your blood sugar goes below 70, or if you have a lot of readings over 200. Please come back for a follow-up appointment in 6 months.

## 2017-10-26 ENCOUNTER — Telehealth: Payer: Self-pay | Admitting: Endocrinology

## 2017-10-26 ENCOUNTER — Other Ambulatory Visit: Payer: Self-pay | Admitting: Endocrinology

## 2017-10-28 ENCOUNTER — Telehealth: Payer: Self-pay | Admitting: Cardiology

## 2017-10-28 NOTE — Telephone Encounter (Signed)
Has question about medication

## 2017-10-28 NOTE — Telephone Encounter (Signed)
Left message to return call 

## 2017-10-29 NOTE — Telephone Encounter (Signed)
Pt questioning if he should be taking Persantine 50 mg tid. LOV with Dr [name] was in May 2018 and at that time was listed at 50 mg tid - pt voiced understanding was confused if he should be taking bid or tid

## 2017-12-13 ENCOUNTER — Other Ambulatory Visit: Payer: Self-pay

## 2017-12-13 MED ORDER — EMPAGLIFLOZIN 25 MG PO TABS: 25.0000 mg | ORAL_TABLET | Freq: Every day | ORAL | 2 refills | Status: DC

## 2017-12-28 ENCOUNTER — Other Ambulatory Visit: Payer: Self-pay | Admitting: Cardiology

## 2018-01-17 ENCOUNTER — Telehealth: Payer: Self-pay | Admitting: Endocrinology

## 2018-01-17 NOTE — Telephone Encounter (Signed)
Dulaglutide (TRULICITY) 1.5 LN/9.8XQ SOPN  Patients wife has a few questions about patients medication and storage.   Please advise

## 2018-01-17 NOTE — Telephone Encounter (Signed)
Spoke to pt concerning his trulicity

## 2018-02-28 ENCOUNTER — Other Ambulatory Visit: Payer: Self-pay | Admitting: Endocrinology

## 2018-04-01 ENCOUNTER — Other Ambulatory Visit (INDEPENDENT_AMBULATORY_CARE_PROVIDER_SITE_OTHER): Payer: BLUE CROSS/BLUE SHIELD

## 2018-04-01 ENCOUNTER — Ambulatory Visit: Payer: BLUE CROSS/BLUE SHIELD | Admitting: Endocrinology

## 2018-04-01 ENCOUNTER — Encounter: Payer: Self-pay | Admitting: Endocrinology

## 2018-04-01 VITALS — BP 140/82 | HR 67 | Wt 260.6 lb

## 2018-04-01 DIAGNOSIS — Z0000 Encounter for general adult medical examination without abnormal findings: Secondary | ICD-10-CM

## 2018-04-01 DIAGNOSIS — Z125 Encounter for screening for malignant neoplasm of prostate: Secondary | ICD-10-CM

## 2018-04-01 DIAGNOSIS — E1151 Type 2 diabetes mellitus with diabetic peripheral angiopathy without gangrene: Secondary | ICD-10-CM | POA: Diagnosis not present

## 2018-04-01 LAB — LIPID PANEL
CHOL/HDL RATIO: 3
CHOLESTEROL: 127 mg/dL (ref 0–200)
HDL: 50.9 mg/dL (ref >39.00)
NONHDL: 76.43
Triglycerides: 241 mg/dL — ABNORMAL HIGH (ref 0.0–149.0)
VLDL: 48.2 mg/dL — ABNORMAL HIGH (ref 0.0–40.0)

## 2018-04-01 LAB — BASIC METABOLIC PANEL
BUN: 14 mg/dL (ref 6–23)
CHLORIDE: 105 meq/L (ref 96–112)
CO2: 26 mEq/L (ref 19–32)
CREATININE: 0.97 mg/dL (ref 0.40–1.50)
Calcium: 9.2 mg/dL (ref 8.4–10.5)
GFR: 82.88 mL/min (ref >60.00)
Glucose, Bld: 175 mg/dL — ABNORMAL HIGH (ref 70–99)
Potassium: 4 mEq/L (ref 3.5–5.1)
Sodium: 140 mEq/L (ref 135–145)

## 2018-04-01 LAB — MICROALBUMIN / CREATININE URINE RATIO
Creatinine,U: 73.6 mg/dL
Microalb Creat Ratio: 1 mg/g (ref 0.0–30.0)
Microalb, Ur: <0.7 mg/dL (ref 0.0–1.9)

## 2018-04-01 LAB — CBC WITH DIFFERENTIAL/PLATELET
BASOS ABS: 0 10*3/uL (ref 0.0–0.1)
Basophils Relative: 0.4 % (ref 0.0–3.0)
Eosinophils Absolute: 0.1 10*3/uL (ref 0.0–0.7)
Eosinophils Relative: 1.9 % (ref 0.0–5.0)
HEMATOCRIT: 45.3 % (ref 39.0–52.0)
HEMOGLOBIN: 15.9 g/dL (ref 13.0–17.0)
Lymphocytes Relative: 40.8 % (ref 12.0–46.0)
Lymphs Abs: 2.7 10*3/uL (ref 0.7–4.0)
MCHC: 35 g/dL (ref 30.0–36.0)
MCV: 89.3 fl (ref 78.0–100.0)
MONOS PCT: 7.9 % (ref 3.0–12.0)
Monocytes Absolute: 0.5 10*3/uL (ref 0.1–1.0)
NEUTROS ABS: 3.2 10*3/uL (ref 1.4–7.7)
Neutrophils Relative %: 49 % (ref 43.0–77.0)
Platelets: 147 10*3/uL — ABNORMAL LOW (ref 150.0–400.0)
RBC: 5.07 Mil/uL (ref 4.22–5.81)
RDW: 13.6 % (ref 11.5–15.5)
WBC: 6.5 10*3/uL (ref 4.0–10.5)

## 2018-04-01 LAB — LDL CHOLESTEROL, DIRECT: Direct LDL: 57 mg/dL

## 2018-04-01 LAB — URINALYSIS, ROUTINE W REFLEX MICROSCOPIC
BILIRUBIN URINE: NEGATIVE
HGB URINE DIPSTICK: NEGATIVE
KETONES UR: NEGATIVE
LEUKOCYTES UA: NEGATIVE
NITRITE: NEGATIVE
RBC / HPF: NONE SEEN (ref 0–?)
Specific Gravity, Urine: <=1.005 — AB (ref 1.000–1.030)
TOTAL PROTEIN, URINE-UPE24: NEGATIVE
UROBILINOGEN UA: 0.2 (ref 0.0–1.0)
Urine Glucose: >=1000 — AB
WBC, UA: NONE SEEN (ref 0–?)
pH: 5.5 (ref 5.0–8.0)

## 2018-04-01 LAB — HEPATIC FUNCTION PANEL
ALBUMIN: 4.1 g/dL (ref 3.5–5.2)
ALK PHOS: 58 U/L (ref 39–117)
ALT: 39 U/L (ref 0–53)
AST: 30 U/L (ref 0–37)
BILIRUBIN DIRECT: 0.1 mg/dL (ref 0.0–0.3)
TOTAL PROTEIN: 6.3 g/dL (ref 6.0–8.3)
Total Bilirubin: 0.6 mg/dL (ref 0.2–1.2)

## 2018-04-01 LAB — POCT GLYCOSYLATED HEMOGLOBIN (HGB A1C): HEMOGLOBIN A1C: 6.6 % — AB (ref 4.0–5.6)

## 2018-04-01 LAB — TSH: TSH: 2.34 u[IU]/mL (ref 0.35–4.50)

## 2018-04-01 LAB — PSA: PSA: 0.29 ng/mL (ref 0.10–4.00)

## 2018-04-01 NOTE — Patient Instructions (Signed)
Please consider these measures for your health:  minimize alcohol.  Do not use tobacco products.  Have a colonoscopy at least every 10 years from age 62.  Keep firearms safely stored.  Always use seat belts.  have working smoke alarms in your home.  See an eye doctor and dentist regularly.  Never drive under the influence of alcohol or drugs (including prescription drugs).  Those with fair skin should take precautions against the sun, and should carefully examine their skin once per month, for any new or changed moles. blood tests are requested for you today.  We'll let you know about the results.   Please come back for a follow-up appointment in 4-6 months.

## 2018-04-01 NOTE — Progress Notes (Signed)
Subjective:    Patient ID: [name] [name], male    DOB: 21-Sep-1956, 62 y.o.   MRN: [id number]  HPI Pt is here for regular wellness examination, and is feeling pretty well in general, and says chronic med probs are stable, except as noted below Past Medical History:  Diagnosis Date  . Arthritis   . Cholelithiasis   . Coronary atherosclerosis of native coronary artery    DES RCA 10/05  . Essential hypertension   . Hyperlipidemia   . OA (osteoarthritis)   . Stroke [facility])    Left pons 6/11 - Aggrenox started  . Testicular cancer ([place]) 1978  . Type 2 diabetes mellitus ([place])   . Urolithiasis     Past Surgical History:  Procedure Laterality Date  . Testicular tumor resection     Right side    Social History   Socioeconomic History  . Marital status: Married    Spouse name: Not on file  . Number of children: 0  . Years of education: Not on file  . Highest education level: Not on file  Occupational History  . Occupation: Truck [employer] operational officer  . Financial resource strain: Not on file  . Food insecurity:    Worry: Not on file    Inability: Not on file  . Transportation needs:    Medical: Not on file    Non-medical: Not on file  Tobacco Use  . Smoking status: Former Smoker    Packs/day: 1.50    Years: 43.00    Pack years: 64.50    Types: Cigarettes    Last attempt to quit: 04/21/2010    Years since quitting: 7.9  . Smokeless tobacco: Never Used  Substance and Sexual Activity  . Alcohol use: Yes    Alcohol/week: 0.0 oz    Comment: "Not much..1 (6 pack) a year"  . Drug use: No  . Sexual activity: Not on file  Lifestyle  . Physical activity:    Days per week: Not on file    Minutes per session: Not on file  . Stress: Not on file  Relationships  . Social connections:    Talks on phone: Not on file    Gets together: Not on file    Attends religious service: Not on file    Active member of club or organization: Not on file    Attends meetings of clubs or  organizations: Not on file    Relationship status: Not on file  . Intimate partner violence:    Fear of current or ex partner: Not on file    Emotionally abused: Not on file    Physically abused: Not on file    Forced sexual activity: Not on file  Other Topics Concern  . Not on file  Social History Narrative  . Not on file    Current Outpatient Medications on File Prior to Visit  Medication Sig Dispense Refill  . bromocriptine (PARLODEL) 2.5 MG tablet TAKE ONE-HALF (1/2) TABLET  AT BEDTIME 45 tablet 2  . colestipol (COLESTID) 1 g tablet TAKE 3 TABLETS DAILY 90 tablet 8  . dipyridamole (PERSANTINE) 50 MG tablet Take 1 tablet 3 times daily 270 tablet 3  . Dulaglutide (TRULICITY) 1.5 LX/7.2IO SOPN Inject 1.5 mg into the skin once a week. 12 pen 3  . empagliflozin (JARDIANCE) 25 MG TABS tablet Take 25 mg by mouth daily. 90 tablet 2  . fish oil-omega-3 fatty acids 1000 MG capsule Take 1 capsule by mouth 2 (  two) times daily.     . metFORMIN (GLUCOPHAGE-XR) 500 MG 24 hr tablet TAKE 2 TABLETS DAILY 180 tablet 1  . Multiple Vitamin (MULTIVITAMIN) tablet Take 1 tablet by mouth daily.    . naproxen sodium (ANAPROX) 275 MG tablet Take 1 tablet (275 mg total) by mouth every morning. 90 tablet 3  . nitroGLYCERIN (NITROSTAT) 0.4 MG SL tablet Place 1 tablet (0.4 mg total) under the tongue every 5 (five) minutes x 3 doses as needed. May repeat every 5 minutes for up to 3 doses 75 tablet 3  . pioglitazone (ACTOS) 15 MG tablet Take 1 tablet (15 mg total) by mouth daily. 90 tablet 3  . simvastatin (ZOCOR) 40 MG tablet TAKE 1 TABLET DAILY AT BEDTIME 90 tablet 0   No current facility-administered medications on file prior to visit.     No Known Allergies  Family History  Problem Relation Age of Onset  . Heart disease Father   . Diabetes Father   . Heart attack Father     BP 140/82 (BP Location: Left Arm, Patient Position: Sitting, Cuff Size: Normal)   Pulse 67   Wt 260 lb 9.6 oz (118.2 kg)    SpO2 96%   BMI 35.34 kg/m     Review of Systems Denies fever, fatigue, visual loss, hearing loss, chest pain, sob, depression, cold intolerance, BRBPR, hematuria, syncope, numbness, allergy sxs, and rash. No change in chronic low-back pain and easy bruising     Objective:   Physical Exam VS: see vs page GEN: no distress HEAD: head: no deformity eyes: no periorbital swelling, no proptosis external nose and ears are normal mouth: no lesion seen.  < 1 cm nodule at the tip of the tongue (pt says unchanged x many years) NECK: supple, thyroid is not enlarged CHEST WALL: no deformity LUNGS: clear to auscultation CV: reg rate and rhythm, no murmur MUSCULOSKELETAL: muscle bulk and strength are grossly normal.  no obvious joint swelling.  gait is normal and steady EXTEMITIES: no deformity.  no ulcer on the feet.  feet are of normal color and temp.  no edema PULSES: dorsalis pedis intact bilat.  no carotid bruit NEURO:  cn 2-12 grossly intact.   readily moves all 4's.  sensation is intact to touch on the feet SKIN:  Normal texture and temperature.  No rash or suspicious lesion is visible.   NODES:  None palpable at the neck PSYCH: alert, well-oriented.  Does not appear anxious nor depressed.  I personally reviewed electrocardiogram tracing (today): Indication: wellness Impression: NSR.  No MI.  No hypertrophy. Compared to 2018: no significant change      Assessment & Plan:  Wellness visit today, with problems stable, except as noted.  SEPARATE EVALUATION FOLLOWS--EACH PROBLEM HERE IS NEW, NOT RESPONDING TO TREATMENT, OR POSES SIGNIFICANT RISK TO THE PATIENT'S HEALTH: HISTORY OF THE PRESENT ILLNESS: Few mos of intermitt moderate abd pain, worst at the periumbilical area.  It is relieved by BM.  Pt says caused by trulicity PAST MEDICAL HISTORY Past Medical History:  Diagnosis Date  . Arthritis   . Cholelithiasis   . Coronary atherosclerosis of native coronary artery    DES RCA  10/05  . Essential hypertension   . Hyperlipidemia   . OA (osteoarthritis)   . Stroke [facility])    Left pons 6/11 - Aggrenox started  . Testicular cancer ([place]) 1978  . Type 2 diabetes mellitus ([place])   . Urolithiasis     Past Surgical History:  Procedure Laterality Date  . Testicular tumor resection     Right side    Social History   Socioeconomic History  . Marital status: Married    Spouse name: Not on file  . Number of children: 0  . Years of education: Not on file  . Highest education level: Not on file  Occupational History  . Occupation: Truck [employer] operational officer  . Financial resource strain: Not on file  . Food insecurity:    Worry: Not on file    Inability: Not on file  . Transportation needs:    Medical: Not on file    Non-medical: Not on file  Tobacco Use  . Smoking status: Former Smoker    Packs/day: 1.50    Years: 43.00    Pack years: 64.50    Types: Cigarettes    Last attempt to quit: 04/21/2010    Years since quitting: 7.9  . Smokeless tobacco: Never Used  Substance and Sexual Activity  . Alcohol use: Yes    Alcohol/week: 0.0 oz    Comment: "Not much..1 (6 pack) a year"  . Drug use: No  . Sexual activity: Not on file  Lifestyle  . Physical activity:    Days per week: Not on file    Minutes per session: Not on file  . Stress: Not on file  Relationships  . Social connections:    Talks on phone: Not on file    Gets together: Not on file    Attends religious service: Not on file    Active member of club or organization: Not on file    Attends meetings of clubs or organizations: Not on file    Relationship status: Not on file  . Intimate partner violence:    Fear of current or ex partner: Not on file    Emotionally abused: Not on file    Physically abused: Not on file    Forced sexual activity: Not on file  Other Topics Concern  . Not on file  Social History Narrative  . Not on file    Current Outpatient Medications on File Prior to Visit    Medication Sig Dispense Refill  . bromocriptine (PARLODEL) 2.5 MG tablet TAKE ONE-HALF (1/2) TABLET  AT BEDTIME 45 tablet 2  . colestipol (COLESTID) 1 g tablet TAKE 3 TABLETS DAILY 90 tablet 8  . dipyridamole (PERSANTINE) 50 MG tablet Take 1 tablet 3 times daily 270 tablet 3  . Dulaglutide (TRULICITY) 1.5 NI/6.2VO SOPN Inject 1.5 mg into the skin once a week. 12 pen 3  . empagliflozin (JARDIANCE) 25 MG TABS tablet Take 25 mg by mouth daily. 90 tablet 2  . fish oil-omega-3 fatty acids 1000 MG capsule Take 1 capsule by mouth 2 (two) times daily.     . metFORMIN (GLUCOPHAGE-XR) 500 MG 24 hr tablet TAKE 2 TABLETS DAILY 180 tablet 1  . Multiple Vitamin (MULTIVITAMIN) tablet Take 1 tablet by mouth daily.    . naproxen sodium (ANAPROX) 275 MG tablet Take 1 tablet (275 mg total) by mouth every morning. 90 tablet 3  . nitroGLYCERIN (NITROSTAT) 0.4 MG SL tablet Place 1 tablet (0.4 mg total) under the tongue every 5 (five) minutes x 3 doses as needed. May repeat every 5 minutes for up to 3 doses 75 tablet 3  . pioglitazone (ACTOS) 15 MG tablet Take 1 tablet (15 mg total) by mouth daily. 90 tablet 3  . simvastatin (ZOCOR) 40 MG tablet TAKE 1 TABLET DAILY AT BEDTIME  90 tablet 0   No current facility-administered medications on file prior to visit.     No Known Allergies  Family History  Problem Relation Age of Onset  . Heart disease Father   . Diabetes Father   . Heart attack Father     BP 140/82 (BP Location: Left Arm, Patient Position: Sitting, Cuff Size: Normal)   Pulse 67   Wt 260 lb 9.6 oz (118.2 kg)   SpO2 96%   BMI 35.34 kg/m   REVIEW OF SYSTEMS: He has lost 18 lbs since last ov.  Denies BRBPR.   PHYSICAL EXAMINATION: VITAL SIGNS:  See vs page GENERAL: no distress ABDOMEN: abdomen is soft, nontender.  no hepatosplenomegaly.  not distended.  no hernia LAB/XRAY RESULTS: Lab Results  Component Value Date   HGBA1C 6.6 (A) 04/01/2018   IMPRESSION: abd pain, new, poss due to  trulicity Type 2 DM: well-controlled HTN: mild.  On no rx now.  PLAN:  Please continue the same medications for now.  We discussed.  Pt declines other w/u for now.

## 2018-04-02 ENCOUNTER — Other Ambulatory Visit: Payer: Self-pay | Admitting: Cardiology

## 2018-05-01 ENCOUNTER — Other Ambulatory Visit: Payer: Self-pay | Admitting: Endocrinology

## 2018-05-01 ENCOUNTER — Other Ambulatory Visit: Payer: Self-pay | Admitting: Cardiology

## 2018-05-08 ENCOUNTER — Other Ambulatory Visit: Payer: Self-pay | Admitting: Cardiology

## 2018-05-17 ENCOUNTER — Other Ambulatory Visit: Payer: Self-pay | Admitting: Endocrinology

## 2018-05-23 ENCOUNTER — Telehealth: Payer: Self-pay | Admitting: Cardiology

## 2018-05-23 MED ORDER — SIMVASTATIN 40 MG PO TABS: 40.0000 mg | ORAL_TABLET | Freq: Every evening | ORAL | 0 refills | Status: DC

## 2018-05-23 MED ORDER — SIMVASTATIN 40 MG PO TABS: 40.0000 mg | ORAL_TABLET | Freq: Every evening | ORAL | 3 refills | Status: DC

## 2018-05-23 NOTE — Telephone Encounter (Signed)
°*  STAT* If patient is at the pharmacy, call can be transferred to refill team.   1. Which medications need to be refilled?  simvastatin (ZOCOR) 40 MG tablet  Needs short refill sent to [facility] [place] And the rest sent to [facility]  2. Which pharmacy/location (including street and city if local pharmacy) is medication to be sent to? See above   3. Do they need a 30 day or 90 day supply?

## 2018-05-23 NOTE — Telephone Encounter (Signed)
Done

## 2018-07-05 ENCOUNTER — Other Ambulatory Visit: Payer: Self-pay | Admitting: Endocrinology

## 2018-07-08 NOTE — Telephone Encounter (Signed)
Please go off this medication, as it is not good for the kidneys

## 2018-07-08 NOTE — Telephone Encounter (Signed)
Refill or refuse please advise 

## 2018-08-03 ENCOUNTER — Other Ambulatory Visit: Payer: Self-pay | Admitting: Endocrinology

## 2018-08-03 ENCOUNTER — Telehealth: Payer: Self-pay | Admitting: Endocrinology

## 2018-08-03 NOTE — Telephone Encounter (Signed)
error 

## 2018-08-04 ENCOUNTER — Telehealth: Payer: Self-pay | Admitting: Endocrinology

## 2018-08-04 NOTE — Telephone Encounter (Signed)
Please call patients wife back, it is in regards to the naproxen sodium (ANAPROX) 275 MG tablet Ph # [phone]

## 2018-08-04 NOTE — Telephone Encounter (Signed)
Called pt to clarify phone call message received today. States he was told 07/08/18 to stop medication as it was not good for his kidneys. States he was never told what else would be prescribed to replace this medication. Advised I would discuss with Dr. [name] and return call once response has been received. Verbalized acceptance and understanding. Message routed to Dr. [name] for orders.

## 2018-08-04 NOTE — Telephone Encounter (Signed)
Please call patients wife back, it is in regards to the naproxen sodium (ANAPROX) 275 MG tablet Ph # [phone]  Called pt to clarify phone call message received today. States he was told 07/08/18 to stop medication as it was not good for his kidneys. States he was never told what else would be prescribed to replace this medication. Advised I would discuss with Dr. [name] and return call once response has been received. Verbalized acceptance and understanding. Message routed to Dr. [name] for orders.

## 2018-08-04 NOTE — Telephone Encounter (Signed)
States he is using for arthritis in neck and hands. States this medication helps with plantar fascitis.

## 2018-08-05 NOTE — Telephone Encounter (Signed)
Called to discuss with pt. Declined offer to be referral to a specialist to better manage his pain/discomfort.

## 2018-08-11 NOTE — Progress Notes (Signed)
Cardiology Office Note  Date: 08/12/2018   ID: [name] [name], DOB 1956-04-19, MRN [id number]  PCP: Patient, No Pcp Per  Primary Cardiologist: [name], MD   Chief Complaint  Patient presents with  . Coronary Artery Disease    History of Present Illness: [name] [name] is a 62 y.o. male last seen in May 2018.  He presents overdue for follow-up.  He does not report any angina symptoms or increasing nitroglycerin use since last evaluation.  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  His last Myoview was in 2017 as outlined below.  Current cardiac regimen includes Zocor, omega-3 supplements, and as needed nitroglycerin.  He is also on Persantine with previous history of stroke.  Follows with Dr. [name] in [place] for management of type 2 diabetes mellitus.  I reviewed his interval lab work from July at which point his LDL was 56.  ECG at that time showed normal sinus rhythm.  Past Medical History:  Diagnosis Date  . Arthritis   . Cholelithiasis   . Coronary atherosclerosis of native coronary artery    DES RCA 10/05  . Essential hypertension   . Hyperlipidemia   . OA (osteoarthritis)   . Stroke [facility])    Left pons 6/11 - Aggrenox started  . Testicular cancer ([place]) 1978  . Type 2 diabetes mellitus ([place])   . Urolithiasis     Past Surgical History:  Procedure Laterality Date  . Testicular tumor resection     Right side    Current Outpatient Medications  Medication Sig Dispense Refill  . bromocriptine (PARLODEL) 2.5 MG tablet TAKE ONE-HALF (1/2) TABLET AT BEDTIME 45 tablet 4  . colestipol (COLESTID) 1 g tablet TAKE 3 TABLETS DAILY 90 tablet 8  . dipyridamole (PERSANTINE) 50 MG tablet TAKE 1 TABLET THREE TIMES A DAY 90 tablet 1  . Dulaglutide (TRULICITY) 1.5 SW/1.0XN SOPN Inject 1.5 mg into the skin once a week. 12 pen 3  . empagliflozin (JARDIANCE) 25 MG TABS tablet Take 25 mg by mouth daily. 90  tablet 2  . fish oil-omega-3 fatty acids 1000 MG capsule Take 1 capsule by mouth 2 (two) times daily.     . metFORMIN (GLUCOPHAGE-XR) 500 MG 24 hr tablet TAKE 2 TABLETS DAILY 180 tablet 1  . Multiple Vitamin (MULTIVITAMIN) tablet Take 1 tablet by mouth daily.    . nitroGLYCERIN (NITROSTAT) 0.4 MG SL tablet Place 1 tablet (0.4 mg total) under the tongue every 5 (five) minutes x 3 doses as needed. May repeat every 5 minutes for up to 3 doses 75 tablet 3  . pioglitazone (ACTOS) 15 MG tablet Take 1 tablet (15 mg total) by mouth daily. 90 tablet 3  . simvastatin (ZOCOR) 40 MG tablet Take 1 tablet (40 mg total) by mouth every evening. 90 tablet 3   No current facility-administered medications for this visit.    Allergies:  Patient has no known allergies.   Social History: The patient  reports that he quit smoking about 8 years ago. His smoking use included cigarettes. He has a 64.50 pack-year smoking history. He has never used smokeless tobacco. He reports that he drinks alcohol. He reports that he does not use drugs.   ROS:  Please see the history of present illness. Otherwise, complete review of systems is positive for none.  All other systems are reviewed and negative.   Physical Exam: VS:  BP (!) 152/78  Pulse 78   Ht 6\' 1"  (1.854 m)   Wt 261 lb (118.4 kg)   SpO2 98%   BMI 34.43 kg/m , BMI Body mass index is 34.43 kg/m.  Wt Readings from Last 3 Encounters:  08/12/18 261 lb (118.4 kg)  04/01/18 260 lb 9.6 oz (118.2 kg)  10/01/17 278 lb (126.1 kg)    General: Obese male, appears comfortable at rest. HEENT: Conjunctiva and lids normal, oropharynx clear. Neck: Supple, no elevated JVP or carotid bruits, no thyromegaly. Lungs: Clear to auscultation, nonlabored breathing at rest. Cardiac: Regular rate and rhythm, no S3 or significant systolic murmur. Abdomen: Obese, nontender, bowel sounds present. Extremities: No pitting edema, distal pulses 2+. Skin: Warm and dry. Musculoskeletal: No  kyphosis. Neuropsychiatric: Alert and oriented x3, affect grossly appropriate.  ECG: I personally reviewed the tracing from 04/05/2018 which showed normal sinus rhythm.  Recent Labwork: 04/01/2018: ALT 39; AST 30; BUN 14; Creatinine, Ser 0.97; Hemoglobin 15.9; Platelets 147.0; Potassium 4.0; Sodium 140; TSH 2.34     Component Value Date/Time   CHOL 127 04/01/2018 1408   TRIG 241.0 (H) 04/01/2018 1408   TRIG 96 07/09/2006 1049   HDL 50.90 04/01/2018 1408   CHOLHDL 3 04/01/2018 1408   VLDL 48.2 (H) 04/01/2018 1408   LDLCALC 56 11/02/2014 1424   LDLDIRECT 57.0 04/01/2018 1408    Other Studies Reviewed Today:  Exercise Myoview 02/17/2016:  No diagnostic ST segment changes to indicate ischemia. Low risk Duke treadmill score of 10.  Blood pressure demonstrated a hypertensive response to exercise.  Small, mild intensity, fixed mid to apical inferior defect that is most prominent at rest and most consistent with attenuation. No definite ischemia.  This is a low risk study.  Nuclear stress EF: 78%.  Assessment and Plan:  1.  CAD with history of DES to the RCA in 2005.  He reports no active angina on medical therapy.  Last ischemic evaluation in 2017 was overall low risk.  We will plan a repeat assessment around February of next year prior to his DOT physical.  No changes made in current medical regimen.  2.  Type 2 diabetes mellitus, followed by Dr. [name].  Hemoglobin A1c was 6.6 back in July.  3.  Essential hypertension by history, currently not on antihypertensive medications.  I recommended follow-up with PCP.  4.  Hyperlipidemia, on Zocor with last LDL 56.  Current medicines were reviewed with the patient today.   Orders Placed This Encounter  Procedures  . NM Myocar Multi W/Spect W/Wall Motion / EF    Disposition: Follow-up in 3 months to review stress testing.  Signed, Satira Sark, MD, [facility] 08/12/2018 2:03 PM    [facility] [address], [place], [place] [address] Phone: [phone]; Fax: [phone]

## 2018-08-12 ENCOUNTER — Telehealth: Payer: Self-pay | Admitting: Cardiology

## 2018-08-12 ENCOUNTER — Encounter: Payer: Self-pay | Admitting: *Deleted

## 2018-08-12 ENCOUNTER — Ambulatory Visit: Payer: BLUE CROSS/BLUE SHIELD | Admitting: Cardiology

## 2018-08-12 ENCOUNTER — Encounter: Payer: Self-pay | Admitting: Cardiology

## 2018-08-12 VITALS — BP 152/78 | HR 78 | Ht 73.0 in | Wt 261.0 lb

## 2018-08-12 DIAGNOSIS — I25119 Atherosclerotic heart disease of native coronary artery with unspecified angina pectoris: Secondary | ICD-10-CM

## 2018-08-12 DIAGNOSIS — E782 Mixed hyperlipidemia: Secondary | ICD-10-CM

## 2018-08-12 DIAGNOSIS — I10 Essential (primary) hypertension: Secondary | ICD-10-CM

## 2018-08-12 DIAGNOSIS — E1159 Type 2 diabetes mellitus with other circulatory complications: Secondary | ICD-10-CM | POA: Diagnosis not present

## 2018-08-12 NOTE — Patient Instructions (Addendum)
Medication Instructions:   Your physician recommends that you continue on your current medications as directed. Please refer to the Current Medication list given to you today.  Labwork:  NONE  Testing/Procedures: Your physician has requested that you have a lexiscan myoview in 3 months just before your next visit. For further information please visit [email or web address]. Please follow instruction sheet, as given.  Follow-Up:  Your physician recommends that you schedule a follow-up appointment in: 3 months.  Any Other Special Instructions Will Be Listed Below (If Applicable).  If you need a refill on your cardiac medications before your next appointment, please call your pharmacy.

## 2018-08-12 NOTE — Telephone Encounter (Signed)
Pre-cert Verification for the following procedure   Lexiscan myoview scheduled for 11-18-2018 at [facility]

## 2018-09-02 ENCOUNTER — Telehealth: Payer: Self-pay

## 2018-09-02 NOTE — Telephone Encounter (Signed)
[name] [name] has been re-scheduled for 09-19-2018 at [facility]

## 2018-09-02 NOTE — Telephone Encounter (Signed)
PA initiated 09/02/18 through Cover My Meds for Jardiance. Received feedback that drug is covered by current benefit plan. Not further PA activity needed. Documents have been placed in the faxed file for future reference.

## 2018-09-16 ENCOUNTER — Encounter: Payer: Self-pay | Admitting: Endocrinology

## 2018-09-16 ENCOUNTER — Ambulatory Visit: Payer: BLUE CROSS/BLUE SHIELD | Admitting: Endocrinology

## 2018-09-16 VITALS — BP 130/78 | HR 71 | Temp 97.7°F | Ht 73.0 in | Wt 262.0 lb

## 2018-09-16 DIAGNOSIS — E1151 Type 2 diabetes mellitus with diabetic peripheral angiopathy without gangrene: Secondary | ICD-10-CM

## 2018-09-16 LAB — POCT GLYCOSYLATED HEMOGLOBIN (HGB A1C): HEMOGLOBIN A1C: 6.3 % — AB (ref 4.0–5.6)

## 2018-09-16 NOTE — Patient Instructions (Signed)

## 2018-09-16 NOTE — Progress Notes (Signed)
Subjective:    Patient ID: [name] [name], male    DOB: [birth year], 62 y.o.   MRN: [id number]  HPI Pt returns for f/u of diabetes mellitus: DM type: 2 Dx'ed: 7035 Complications: CAD and CVA.   Therapy: 6 oral meds DKA: never Severe hypoglycemia: never.   Pancreatitis: never Other: he has never taken insulin; he works as a Pharmacist, community.  Interval history: he says cbg's are in the low-100's.  pt states he feels well in general.   Past Medical History:  Diagnosis Date  . Arthritis   . Cholelithiasis   . Coronary atherosclerosis of native coronary artery    DES RCA 10/05  . Essential hypertension   . Hyperlipidemia   . OA (osteoarthritis)   . Stroke [facility])    Left pons 6/11 - Aggrenox started  . Testicular cancer ([place]) 1978  . Type 2 diabetes mellitus ([place])   . Urolithiasis     Past Surgical History:  Procedure Laterality Date  . Testicular tumor resection     Right side    Social History   Socioeconomic History  . Marital status: Married    Spouse name: Not on file  . Number of children: 0  . Years of education: Not on file  . Highest education level: Not on file  Occupational History  . Occupation: [employer] operational officer  . Financial resource strain: Not on file  . Food insecurity:    Worry: Not on file    Inability: Not on file  . Transportation needs:    Medical: Not on file    Non-medical: Not on file  Tobacco Use  . Smoking status: Former Smoker    Packs/day: 1.50    Years: 43.00    Pack years: 64.50    Types: Cigarettes    Last attempt to quit: 04/21/2010    Years since quitting: 8.4  . Smokeless tobacco: Never Used  Substance and Sexual Activity  . Alcohol use: Yes    Alcohol/week: 0.0 standard drinks    Comment: "Not much..1 (6 pack) a year"  . Drug use: No  . Sexual activity: Not on file  Lifestyle  . Physical activity:    Days per week: Not on file    Minutes per session: Not on file  . Stress: Not on file  Relationships  . Social  connections:    Talks on phone: Not on file    Gets together: Not on file    Attends religious service: Not on file    Active member of club or organization: Not on file    Attends meetings of clubs or organizations: Not on file    Relationship status: Not on file  . Intimate partner violence:    Fear of current or ex partner: Not on file    Emotionally abused: Not on file    Physically abused: Not on file    Forced sexual activity: Not on file  Other Topics Concern  . Not on file  Social History Narrative  . Not on file    Current Outpatient Medications on File Prior to Visit  Medication Sig Dispense Refill  . bromocriptine (PARLODEL) 2.5 MG tablet TAKE ONE-HALF (1/2) TABLET AT BEDTIME 45 tablet 4  . colestipol (COLESTID) 1 g tablet TAKE 3 TABLETS DAILY 90 tablet 8  . dipyridamole (PERSANTINE) 50 MG tablet TAKE 1 TABLET THREE TIMES A DAY 90 tablet 1  . Dulaglutide (TRULICITY) 1.5 KK/9.3GH SOPN Inject 1.5 mg into the skin  once a week. 12 pen 3  . empagliflozin (JARDIANCE) 25 MG TABS tablet Take 25 mg by mouth daily. 90 tablet 2  . fish oil-omega-3 fatty acids 1000 MG capsule Take 1 capsule by mouth 2 (two) times daily.     . metFORMIN (GLUCOPHAGE-XR) 500 MG 24 hr tablet TAKE 2 TABLETS DAILY 180 tablet 1  . Multiple Vitamin (MULTIVITAMIN) tablet Take 1 tablet by mouth daily.    . nitroGLYCERIN (NITROSTAT) 0.4 MG SL tablet Place 1 tablet (0.4 mg total) under the tongue every 5 (five) minutes x 3 doses as needed. May repeat every 5 minutes for up to 3 doses 75 tablet 3  . pioglitazone (ACTOS) 15 MG tablet Take 1 tablet (15 mg total) by mouth daily. 90 tablet 3  . simvastatin (ZOCOR) 40 MG tablet Take 1 tablet (40 mg total) by mouth every evening. 90 tablet 3   No current facility-administered medications on file prior to visit.     No Known Allergies  Family History  Problem Relation Age of Onset  . Heart disease Father   . Diabetes Father   . Heart attack Father     BP  130/78 (BP Location: Left Arm, Patient Position: Sitting, Cuff Size: Large)   Pulse 71   Temp 97.7 F (36.5 C) (Oral)   Ht 6\' 1"  (1.854 m)   Wt 262 lb (118.8 kg)   BMI 34.57 kg/m    Review of Systems He denies hypoglycemia.      Objective:   Physical Exam VITAL SIGNS:  See vs page GENERAL: no distress Pulses: dorsalis pedis intact bilat.   MSK: no deformity of the feet CV: no leg edema Skin:  no ulcer on the feet.  normal color and temp on the feet. Neuro: sensation is intact to touch on the feet Ext: There is bilateral onychomycosis of the toenails.     Lab Results  Component Value Date   HGBA1C 6.3 (A) 09/16/2018   Lab Results  Component Value Date   CREATININE 0.97 04/01/2018   BUN 14 04/01/2018   NA 140 04/01/2018   K 4.0 04/01/2018   CL 105 04/01/2018   CO2 26 04/01/2018    Lab Results  Component Value Date   HGBA1C 6.3 (A) 09/16/2018       Assessment & Plan:  Type 2 DM, with CVA: well-controlled CAD: he needs to avoid hypoglycemia.    Patient Instructions  check your blood sugar once a day.  vary the time of day when you check, between before the 3 meals, and at bedtime.  also check if you have symptoms of your blood sugar being too high or too low.  please keep a record of the readings and bring it to your next appointment here (or you can bring the meter itself).  You can write it on any piece of paper.  please call us sooner if your blood sugar goes below 70, or if you have a lot of readings over 200.  Please continue the same medications.   Please come back for a follow-up appointment in 4-6 months.

## 2018-09-19 ENCOUNTER — Encounter (HOSPITAL_BASED_OUTPATIENT_CLINIC_OR_DEPARTMENT_OTHER)
Admission: RE | Admit: 2018-09-19 | Discharge: 2018-09-19 | Disposition: A | Discharge status: Home / Self Care | Payer: BLUE CROSS/BLUE SHIELD | Source: Ambulatory Visit | Attending: Cardiology | Admitting: Cardiology

## 2018-09-19 ENCOUNTER — Ambulatory Visit (HOSPITAL_COMMUNITY)
Admission: RE | Admit: 2018-09-19 | Discharge: 2018-09-19 | Disposition: A | Discharge status: Home / Self Care | Payer: BLUE CROSS/BLUE SHIELD | Source: Ambulatory Visit | Attending: Cardiology | Admitting: Cardiology

## 2018-09-19 ENCOUNTER — Telehealth: Payer: Self-pay | Admitting: *Deleted

## 2018-09-19 ENCOUNTER — Encounter (HOSPITAL_COMMUNITY): Payer: Self-pay

## 2018-09-19 DIAGNOSIS — I25119 Atherosclerotic heart disease of native coronary artery with unspecified angina pectoris: Secondary | ICD-10-CM | POA: Insufficient documentation

## 2018-09-19 LAB — NM MYOCAR MULTI W/SPECT W/WALL MOTION / EF
LV dias vol: 70 mL (ref 62–150)
LV sys vol: 18 mL
Peak HR: 93 {beats}/min
RATE: 0.34
Rest HR: 75 {beats}/min
SDS: 0
SRS: 0
SSS: 0
TID: 1.35

## 2018-09-19 LAB — HM DIABETES EYE EXAM

## 2018-09-19 MED ORDER — SODIUM CHLORIDE 0.9% FLUSH
INTRAVENOUS | Status: AC
  Administered 2018-09-19: 10 mL via INTRAVENOUS
  Filled 2018-09-19: qty 10

## 2018-09-19 MED ORDER — TECHNETIUM TC 99M TETROFOSMIN IV KIT
30.0000 | PACK | Freq: Once | INTRAVENOUS | Status: AC | PRN
  Administered 2018-09-19: 31 via INTRAVENOUS

## 2018-09-19 MED ORDER — REGADENOSON 0.4 MG/5ML IV SOLN
INTRAVENOUS | Status: AC
  Administered 2018-09-19: 0.4 mg via INTRAVENOUS
  Filled 2018-09-19: qty 5

## 2018-09-19 MED ORDER — TECHNETIUM TC 99M TETROFOSMIN IV KIT
10.0000 | PACK | Freq: Once | INTRAVENOUS | Status: AC | PRN
  Administered 2018-09-19: 10.46 via INTRAVENOUS

## 2018-09-19 NOTE — Telephone Encounter (Signed)
-----   Message from Satira Sark, MD sent at 09/19/2018  3:38 PM EST ----- Results reviewed.  Stress test was low risk without significant ischemia.  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 A copy of this test should be forwarded to Renato Shin, MD.

## 2018-09-19 NOTE — Telephone Encounter (Signed)
Patient informed. Copy sent to PCP °

## 2018-09-28 ENCOUNTER — Other Ambulatory Visit: Payer: Self-pay | Admitting: Endocrinology

## 2018-10-03 ENCOUNTER — Other Ambulatory Visit: Payer: Self-pay | Admitting: *Deleted

## 2018-10-03 MED ORDER — NITROGLYCERIN 0.4 MG SL SUBL: 0.4000 mg | SUBLINGUAL_TABLET | SUBLINGUAL | 3 refills | Status: DC | PRN

## 2018-10-21 ENCOUNTER — Ambulatory Visit: Payer: BLUE CROSS/BLUE SHIELD | Admitting: Cardiology

## 2018-11-17 ENCOUNTER — Other Ambulatory Visit: Payer: Self-pay | Admitting: Endocrinology

## 2018-11-18 ENCOUNTER — Encounter (HOSPITAL_COMMUNITY): Payer: BLUE CROSS/BLUE SHIELD

## 2018-12-07 ENCOUNTER — Encounter: Payer: Self-pay | Admitting: Internal Medicine

## 2018-12-09 ENCOUNTER — Ambulatory Visit: Payer: BLUE CROSS/BLUE SHIELD | Admitting: Cardiology

## 2019-01-02 ENCOUNTER — Other Ambulatory Visit: Payer: Self-pay | Admitting: Endocrinology

## 2019-01-09 ENCOUNTER — Other Ambulatory Visit: Payer: Self-pay | Admitting: Endocrinology

## 2019-01-23 ENCOUNTER — Telehealth: Payer: Self-pay | Admitting: Cardiology

## 2019-01-23 ENCOUNTER — Telehealth: Payer: Self-pay | Admitting: Endocrinology

## 2019-01-23 MED ORDER — DIPYRIDAMOLE 50 MG PO TABS: ORAL_TABLET | ORAL | 0 refills | Status: DC

## 2019-01-23 NOTE — Telephone Encounter (Signed)
Called pt and made him aware of Dr. Cordelia Pen response re: request to refill. Verbalized acceptance and understanding.

## 2019-01-23 NOTE — Telephone Encounter (Signed)
MEDICATION: dipyridamole (PERSANTINE) 50 MG tablet  PHARMACY:  [facility]  IS THIS A 90 DAY SUPPLY :   IS PATIENT OUT OF MEDICATION:   IF NOT; HOW MUCH IS LEFT:   LAST APPOINTMENT DATE: @4 /13/2020  NEXT APPOINTMENT DATE:@7 /06/2019  DO WE HAVE YOUR PERMISSION TO LEAVE A DETAILED MESSAGE:  OTHER COMMENTS:  Patients wife stated that [facility] stated to them it could not be filled.  **Let patient know to contact pharmacy at the end of the day to make sure medication is ready. **  ** Please notify patient to allow 48-72 hours to process**  **Encourage patient to contact the pharmacy for refills or they can request refills through [facility]**

## 2019-01-23 NOTE — Telephone Encounter (Signed)
°*  STAT* If patient is at the pharmacy, call can be transferred to refill team.   1. Which medications need to be refilled?dipyridamole (PERSANTINE) 50 MG tablet    2. Which pharmacy/location (including street and city if local pharmacy) is medication to be sent to? [facility] [place] and [facility] to   [facility]  3. Do they need a 30 day or 90 day supply?

## 2019-01-23 NOTE — Telephone Encounter (Signed)
Please refill the medication.  It sounds like he has no other options.

## 2019-01-23 NOTE — Telephone Encounter (Signed)
This med is outside the scope of my practice.  This is prob rx'ed by cardiol.

## 2019-01-23 NOTE — Telephone Encounter (Signed)
He was prescribed Persantine previously with history of stroke, generally this would be filled by PCP or neurologist.

## 2019-01-23 NOTE — Telephone Encounter (Signed)
Please advise if refill is appropriate 

## 2019-03-03 DIAGNOSIS — Z8673 Personal history of transient ischemic attack (TIA), and cerebral infarction without residual deficits: Secondary | ICD-10-CM | POA: Diagnosis not present

## 2019-03-03 DIAGNOSIS — E119 Type 2 diabetes mellitus without complications: Secondary | ICD-10-CM | POA: Diagnosis not present

## 2019-03-03 DIAGNOSIS — E782 Mixed hyperlipidemia: Secondary | ICD-10-CM | POA: Diagnosis not present

## 2019-03-03 DIAGNOSIS — Z6835 Body mass index (BMI) 35.0-35.9, adult: Secondary | ICD-10-CM | POA: Diagnosis not present

## 2019-03-06 ENCOUNTER — Encounter: Payer: Self-pay | Admitting: Internal Medicine

## 2019-03-16 ENCOUNTER — Ambulatory Visit: Payer: BC Managed Care – PPO | Admitting: Internal Medicine

## 2019-03-16 ENCOUNTER — Encounter: Payer: Self-pay | Admitting: Internal Medicine

## 2019-03-16 VITALS — Ht 72.0 in | Wt 280.0 lb

## 2019-03-16 DIAGNOSIS — Z1211 Encounter for screening for malignant neoplasm of colon: Secondary | ICD-10-CM

## 2019-03-16 NOTE — Progress Notes (Signed)
   I spoke to the patient's wife he is on the road as a Administrator.  An office visit was sent out because he is on Persantine, because of a remote history of a stroke.  While that has been on our list of medications held for colonoscopy in the past I do not do that at this point because I do not think the risk-benefit ratio favors it.  That said, I encouraged his wife to see if her husband can see a neurologist, apparently he has never seen a neurologist about the stroke and I do not see in the chart where and how this was diagnosed and treated.  Perhaps he was on the road when that occurred.  She does not remember.  He is a just established with primary care so I encouraged them to see if a neurology consultation could be obtained.  Thus, an office visit was not really required for this.  I will not charge for this.  We will proceed with a direct colonoscopy has a previsit call pending and he does not need to hold Persantine prior to colonoscopy.  Gatha Mayer, MD, [facility]

## 2019-03-22 ENCOUNTER — Other Ambulatory Visit: Payer: Self-pay

## 2019-03-22 ENCOUNTER — Ambulatory Visit: Payer: BC Managed Care – PPO | Admitting: *Deleted

## 2019-03-22 VITALS — Ht 72.0 in | Wt 247.0 lb

## 2019-03-22 DIAGNOSIS — Z1211 Encounter for screening for malignant neoplasm of colon: Secondary | ICD-10-CM

## 2019-03-22 NOTE — Progress Notes (Addendum)
Patient's pre-visit was done today over the phone with the patient due to COVID-19 pandemic. Name,DOB and address verified. Insurance verified. Packet of Prep instructions mailed to patient including copy of a consent form and pre-procedure patient acknowledgement form-pt is aware. Patient understands to call us back with any questions or concerns. Patient denies any allergies to eggs or soy. Patient denies any problems with anesthesia/sedation. Patient denies any oxygen use at home. Patient denies taking any diet/weight loss medications patient takes blood thinner-stay on this per Dr.Gessner-pt is aware. EMMI education declined by the patient.  Pt is aware that care partner will wait in the car during parking lot; if they feel like they will be too hot to wait in the car; they may wait in the lobby.  We want them to wear a mask (we do not have any that we can provide them), practice social distancing, and we will check their temperatures when they get here.  I did remind patient that their care partner needs to stay in the parking lot the entire time. Pt will wear mask into building

## 2019-03-24 ENCOUNTER — Ambulatory Visit: Payer: BLUE CROSS/BLUE SHIELD | Admitting: Endocrinology

## 2019-04-05 ENCOUNTER — Other Ambulatory Visit: Payer: Self-pay

## 2019-04-05 ENCOUNTER — Encounter: Payer: Self-pay | Admitting: Internal Medicine

## 2019-04-05 ENCOUNTER — Ambulatory Visit (AMBULATORY_SURGERY_CENTER): Payer: BC Managed Care – PPO | Admitting: Internal Medicine

## 2019-04-05 VITALS — BP 119/56 | HR 59 | Temp 97.7°F | Resp 15 | Ht 72.0 in | Wt 247.0 lb

## 2019-04-05 DIAGNOSIS — D123 Benign neoplasm of transverse colon: Secondary | ICD-10-CM

## 2019-04-05 DIAGNOSIS — D120 Benign neoplasm of cecum: Secondary | ICD-10-CM | POA: Diagnosis not present

## 2019-04-05 DIAGNOSIS — Z1211 Encounter for screening for malignant neoplasm of colon: Secondary | ICD-10-CM

## 2019-04-05 DIAGNOSIS — D124 Benign neoplasm of descending colon: Secondary | ICD-10-CM

## 2019-04-05 DIAGNOSIS — D122 Benign neoplasm of ascending colon: Secondary | ICD-10-CM

## 2019-04-05 DIAGNOSIS — D125 Benign neoplasm of sigmoid colon: Secondary | ICD-10-CM | POA: Diagnosis not present

## 2019-04-05 MED ORDER — SODIUM CHLORIDE 0.9 % IV SOLN: 500.0000 mL | Freq: Once | INTRAVENOUS | Status: DC

## 2019-04-05 NOTE — Op Note (Signed)
[place] Patient Name: [name] [name] Procedure Date: 04/05/2019 8:47 AM MRN: [id number] Endoscopist: Gatha Mayer , MD Age: 63 Referring MD:  Date of Birth: 27-Jun-1956 Gender: Male Account #: [id number] Procedure:                Colonoscopy Indications:              Screening for colorectal malignant neoplasm, Last                            colonoscopy: 2010 Medicines:                Propofol per Anesthesia, Monitored Anesthesia Care Procedure:                Pre-Anesthesia Assessment:                           - Prior to the procedure, a History and Physical                            was performed, and patient medications and                            allergies were reviewed. The patient's tolerance of                            previous anesthesia was also reviewed. The risks                            and benefits of the procedure and the sedation                            options and risks were discussed with the patient.                            All questions were answered, and informed consent                            was obtained. Prior Anticoagulants: The patient has                            taken no previous anticoagulant or antiplatelet                            agents. ASA Grade Assessment: II - A patient with                            mild systemic disease. After reviewing the risks                            and benefits, the patient was deemed in                            satisfactory condition to undergo the procedure.  After obtaining informed consent, the colonoscope                            was passed under direct vision. Throughout the                            procedure, the patient's blood pressure, pulse, and                            oxygen saturations were monitored continuously. The                            Colonoscope was introduced through the anus and                            advanced to the the  cecum, identified by                            appendiceal orifice and ileocecal valve. The                            colonoscopy was performed without difficulty. The                            patient tolerated the procedure well. The quality                            of the bowel preparation was good. The bowel                            preparation used was Miralax via split dose                            instruction. The ileocecal valve, appendiceal                            orifice, and rectum were photographed. Scope In: 8:52:54 AM Scope Out: 9:12:57 AM Scope Withdrawal Time: 0 hours 17 minutes 31 seconds  Total Procedure Duration: 0 hours 20 minutes 3 seconds  Findings:                 The perianal examination was normal.                           The digital rectal exam findings include decreased                            sphincter tone. Pertinent negatives include normal                            prostate (size, shape, and consistency).                           Seven sessile polyps were found in the sigmoid  colon, descending colon, transverse colon,                            ascending colon and cecum. The polyps were                            diminutive in size. These polyps were removed with                            a cold snare. Resection and retrieval were                            complete. Verification of patient identification                            for the specimen was done. Estimated blood loss was                            minimal.                           The exam was otherwise without abnormality on                            direct and retroflexion views. Complications:            No immediate complications. Estimated Blood Loss:     Estimated blood loss was minimal. Impression:               - Decreased sphincter tone found on digital rectal                            exam.                           - Seven diminutive  polyps in the sigmoid colon, in                            the descending colon, in the transverse colon, in                            the ascending colon and in the cecum, removed with                            a cold snare. Resected and retrieved.                           - The examination was otherwise normal on direct                            and retroflexion views. Recommendation:           - Patient has a contact number available for                            emergencies. The signs and  symptoms of potential                            delayed complications were discussed with the                            patient. Return to normal activities tomorrow.                            Written discharge instructions were provided to the                            patient.                           - Resume previous diet.                           - Continue present medications.                           - Repeat colonoscopy is recommended. The                            colonoscopy date will be determined after pathology                            results from today's exam become available for                            review.                           - He should schedule an office visit to have                            chronic episodic diarrhea evaluated. telehealth                            would be ok.                           CC Alyssa Allwardt PA-C Gatha Mayer, MD 04/05/2019 9:22:09 AM This report has been signed electronically.

## 2019-04-05 NOTE — Progress Notes (Signed)
Pt's states no medical or surgical changes since previsit or office visit. 

## 2019-04-05 NOTE — Progress Notes (Signed)
PT taken to PACU. Monitors in place. VSS. Report given to RN. 

## 2019-04-05 NOTE — Patient Instructions (Addendum)
I found and removed 7 tiny polyps today. I will let you know pathology results and when to have another routine colonoscopy by mail and/or My Chart.  I did not see anything that would explain the episodic pain and diarrhea. We saw you for that in 2017 so it has been happening a long tine (unfortunately). It is probably IBS or Irritable Bowel Syndrome.  I need to talk to you more about this so if you could schedule a visit with me - when your schedule allows that would help me to better help you.  I appreciate the opportunity to care for you. Gatha Mayer, MD, FACG   YOU HAD AN ENDOSCOPIC PROCEDURE TODAY AT [facility]:   Refer to the procedure report that was given to you for any specific questions about what was found during the examination.  If the procedure report does not answer your questions, please call your gastroenterologist to clarify.  If you requested that your care partner not be given the details of your procedure findings, then the procedure report has been included in a sealed envelope for you to review at your convenience later.  YOU SHOULD EXPECT: Some feelings of bloating in the abdomen. Passage of more gas than usual.  Walking can help get rid of the air that was put into your GI tract during the procedure and reduce the bloating. If you had a lower endoscopy (such as a colonoscopy or flexible sigmoidoscopy) you may notice spotting of blood in your stool or on the toilet paper. If you underwent a bowel prep for your procedure, you may not have a normal bowel movement for a few days.  Please Note:  You might notice some irritation and congestion in your nose or some drainage.  This is from the oxygen used during your procedure.  There is no need for concern and it should clear up in a day or so.  SYMPTOMS TO REPORT IMMEDIATELY:   Following lower endoscopy (colonoscopy or flexible sigmoidoscopy):  Excessive amounts of blood in the stool  Significant tenderness or worsening of abdominal pains  Swelling of the abdomen that is new, acute  Fever of 100F or higher   For urgent or emergent issues, a gastroenterologist can be reached at any hour by calling [phone].   DIET:  We do recommend a small meal at first, but then you may proceed to your regular diet.  Drink plenty of fluids but you should avoid alcoholic beverages for 24 hours.  ACTIVITY:  You should plan to take it easy for the rest of today and you should NOT DRIVE or use heavy machinery until tomorrow (because of the sedation medicines used during the test).    FOLLOW UP: Our staff will call the number listed on your records 48-72 hours following your procedure to check on you and address any questions or concerns that you may have regarding the information given to you following your procedure. If we do not reach you, we will leave a message.  We will attempt to reach you two times.  During this call, we will ask if you have developed any symptoms of COVID 19. If you develop any symptoms (ie: fever, flu-like symptoms, shortness of breath, cough etc.) before then, please call [phone].  If you test positive for Covid 19 in the 2 weeks post procedure, please call and report this information to [place].    If any biopsies were taken you will be contacted by phone  or by letter within the next 1-3 weeks.  Please call us at [phone] if you have not heard about the biopsies in 3 weeks.    SIGNATURES/CONFIDENTIALITY: You and/or your care partner have signed paperwork which will be entered into your electronic medical record.  These signatures attest to the fact that that the information above on your After Visit Summary has been reviewed and is understood.  Full responsibility of the confidentiality of this discharge information lies with you and/or your care-partner.

## 2019-04-07 ENCOUNTER — Telehealth: Payer: Self-pay

## 2019-04-07 ENCOUNTER — Ambulatory Visit: Payer: BC Managed Care – PPO | Admitting: Endocrinology

## 2019-04-07 ENCOUNTER — Other Ambulatory Visit: Payer: Self-pay

## 2019-04-07 ENCOUNTER — Encounter: Payer: Self-pay | Admitting: Endocrinology

## 2019-04-07 VITALS — BP 144/72 | HR 74 | Ht 72.0 in | Wt 254.4 lb

## 2019-04-07 DIAGNOSIS — E1151 Type 2 diabetes mellitus with diabetic peripheral angiopathy without gangrene: Secondary | ICD-10-CM

## 2019-04-07 LAB — POCT GLYCOSYLATED HEMOGLOBIN (HGB A1C): Hemoglobin A1C: 6.6 % — AB (ref 4.0–5.6)

## 2019-04-07 NOTE — Telephone Encounter (Signed)
  Follow up Call-  Call back number 04/05/2019  Post procedure Call Back phone  # [phone]  Permission to leave phone message Yes  Some recent data might be hidden     Patient questions:  Do you have a fever, pain , or abdominal swelling? No. Pain Score  0 *  Have you tolerated food without any problems? Yes.    Have you been able to return to your normal activities? Yes.    Do you have any questions about your discharge instructions: Diet   No. Medications  No. Follow up visit  No.  Do you have questions or concerns about your Care? No.  Actions: * If pain score is 4 or above: 1. No action needed, pain <4.Have you developed a fever since your procedure? no  2.   Have you had an respiratory symptoms (SOB or cough) since your procedure? no  3.   Have you tested positive for COVID 19 since your procedure no  4.   Have you had any family members/close contacts diagnosed with the COVID 19 since your procedure?  no   If yes to any of these questions please route to Joylene John, RN and Alphonsa Gin, Therapist, sports.

## 2019-04-07 NOTE — Progress Notes (Signed)
Subjective:    Patient ID: [name] [name], male    DOB: 12-Oct-1955, 63 y.o.   MRN: [id number]  HPI Pt returns for f/u of diabetes mellitus: DM type: 2 Dx'ed: 7106 Complications: CAD and CVA.   Therapy: Trulicity and 5 oral meds DKA: never Severe hypoglycemia: never.   Pancreatitis: never.   Other: he has never taken insulin; he works as a Pharmacist, community.  Interval history: he says cbg's are in the low-100's.  pt states he feels well in general.  He takes meds as rx'ed. Past Medical History:  Diagnosis Date  . Arthritis   . Cholelithiasis   . Coronary atherosclerosis of native coronary artery    DES RCA 10/05  . Essential hypertension   . Hyperlipidemia   . OA (osteoarthritis)   . Stroke [facility])    Left pons 6/11 - Aggrenox started  . Testicular cancer ([place]) 1978  . Type 2 diabetes mellitus ([place])   . Urolithiasis     Past Surgical History:  Procedure Laterality Date  . Colonoscopy    . CORONARY STENT PLACEMENT  2005  . Testicular tumor resection     Right side    Social History   Socioeconomic History  . Marital status: Married    Spouse name: [name] [name]  . Number of children: 0  . Years of education: Not on file  . Highest education level: Not on file  Occupational History  . Occupation: Truck [employer] operational officer  . Financial resource strain: Not on file  . Food insecurity    Worry: Not on file    Inability: Not on file  . Transportation needs    Medical: Not on file    Non-medical: Not on file  Tobacco Use  . Smoking status: Former Smoker    Packs/day: 1.50    Years: 43.00    Pack years: 64.50    Types: Cigarettes    Quit date: 04/21/2010    Years since quitting: 8.9  . Smokeless tobacco: Never Used  Substance and Sexual Activity  . Alcohol use: Yes    Alcohol/week: 0.0 standard drinks    Comment: "Not much..1 (6 pack) a year"  . Drug use: No  . Sexual activity: Not on file  Lifestyle  . Physical activity    Days per week: Not on file    Minutes per  session: Not on file  . Stress: Not on file  Relationships  . Social Herbalist on phone: Not on file    Gets together: Not on file    Attends religious service: Not on file    Active member of club or organization: Not on file    Attends meetings of clubs or organizations: Not on file    Relationship status: Not on file  . Intimate partner violence    Fear of current or ex partner: Not on file    Emotionally abused: Not on file    Physically abused: Not on file    Forced sexual activity: Not on file  Other Topics Concern  . Not on file  Social History Narrative  . Not on file    Current Outpatient Medications on File Prior to Visit  Medication Sig Dispense Refill  . bromocriptine (PARLODEL) 2.5 MG tablet TAKE ONE-HALF (1/2) TABLET AT BEDTIME 45 tablet 4  . colestipol (COLESTID) 1 g tablet TAKE 3 TABLETS DAILY 90 tablet 8  . dipyridamole (PERSANTINE) 50 MG tablet TAKE 1 TABLET THREE TIMES A DAY  90 tablet 0  . fish oil-omega-3 fatty acids 1000 MG capsule Take 1 capsule by mouth 2 (two) times daily.     Marland Kitchen JARDIANCE 25 MG TABS tablet TAKE 1 TABLET DAILY 90 tablet 4  . metFORMIN (GLUCOPHAGE-XR) 500 MG 24 hr tablet TAKE 2 TABLETS DAILY 180 tablet 4  . Multiple Vitamin (MULTIVITAMIN) tablet Take 1 tablet by mouth daily.    . nitroGLYCERIN (NITROSTAT) 0.4 MG SL tablet Place 1 tablet (0.4 mg total) under the tongue every 5 (five) minutes x 3 doses as needed. May repeat every 5 minutes for up to 3 doses 75 tablet 3  . pioglitazone (ACTOS) 15 MG tablet TAKE 1 TABLET DAILY 90 tablet 3  . simvastatin (ZOCOR) 40 MG tablet Take 1 tablet (40 mg total) by mouth every evening. 90 tablet 3  . TRULICITY 1.5 PV/3.7SM SOPN INJECT 1.5 MG INTO THE SKIN ONCE A WEEK 6 mL 4   No current facility-administered medications on file prior to visit.     No Known Allergies  Family History  Problem Relation Age of Onset  . Heart disease Father   . Diabetes Father   . Heart attack Father   .  Colon cancer Neg Hx   . Esophageal cancer Neg Hx   . Rectal cancer Neg Hx   . Stomach cancer Neg Hx   . Colon polyps Neg Hx     BP (!) 144/72 (BP Location: Left Arm, Patient Position: Sitting, Cuff Size: Large)   Pulse 74   Ht 6' (1.829 m)   Wt 254 lb 6.4 oz (115.4 kg)   SpO2 94%   BMI 34.50 kg/m    Review of Systems He denies hypoglycemia.     Objective:   Physical Exam VITAL SIGNS:  See vs page GENERAL: no distress Pulses: dorsalis pedis intact bilat.   MSK: no deformity of the feet CV: no leg edema Skin:  no ulcer on the feet.  normal color and temp on the feet. Neuro: sensation is intact to touch on the feet  Lab Results  Component Value Date   HGBA1C 6.6 (A) 04/07/2019       Assessment & Plan:  HTN: is noted today Type 2 DM, with CAD: well-controlled   Patient Instructions  Your blood pressure is high today.  Please see your primary care provider soon, to have it rechecked check your blood sugar once a day.  vary the time of day when you check, between before the 3 meals, and at bedtime.  also check if you have symptoms of your blood sugar being too high or too low.  please keep a record of the readings and bring it to your next appointment here (or you can bring the meter itself).  You can write it on any piece of paper.  please call us sooner if your blood sugar goes below 70, or if you have a lot of readings over 200.  Please continue the same medications.   Please come back for a follow-up appointment in 4-6 months.

## 2019-04-07 NOTE — Patient Instructions (Signed)
Your blood pressure is high today.  Please see your primary care provider soon, to have it rechecked check your blood sugar once a day.  vary the time of day when you check, between before the 3 meals, and at bedtime.  also check if you have symptoms of your blood sugar being too high or too low.  please keep a record of the readings and bring it to your next appointment here (or you can bring the meter itself).  You can write it on any piece of paper.  please call us sooner if your blood sugar goes below 70, or if you have a lot of readings over 200.  Please continue the same medications.   Please come back for a follow-up appointment in 4-6 months.

## 2019-04-11 ENCOUNTER — Encounter: Payer: Self-pay | Admitting: Internal Medicine

## 2019-04-11 NOTE — Progress Notes (Signed)
7 diminutive adenomas Recall 2023 My Chart letter

## 2019-04-27 ENCOUNTER — Telehealth: Payer: Self-pay | Admitting: Internal Medicine

## 2019-04-27 NOTE — Telephone Encounter (Signed)
Patient notified of letter and results.  I mailed him a copy of the letter.  He will call back for any additional questions or concerns.

## 2019-04-27 NOTE — Telephone Encounter (Signed)
Pt requested a call back to discuss path results of colonoscopy.

## 2019-05-02 ENCOUNTER — Telehealth: Payer: Self-pay | Admitting: Cardiology

## 2019-05-02 NOTE — Telephone Encounter (Signed)
Virtual Visit Pre-Appointment Phone Call  "(Name), I am calling you today to discuss your upcoming appointment. We are currently trying to limit exposure to the virus that causes COVID-19 by seeing patients at home rather than in the office."  1. "What is the BEST phone number to call the day of the visit?" - include this in appointment notes  2. Do you have or have access to (through a family member/friend) a smartphone with video capability that we can use for your visit?" a. If yes - list this number in appt notes as cell (if different from BEST phone #) and list the appointment type as a VIDEO visit in appointment notes b. If no - list the appointment type as a PHONE visit in appointment notes  3. Confirm consent - "In the setting of the current Covid19 crisis, you are scheduled for a (phone or video) visit with your provider on (date) at (time).  Just as we do with many in-office visits, in order for you to participate in this visit, we must obtain consent.  If you'd like, I can send this to your mychart (if signed up) or email for you to review.  Otherwise, I can obtain your verbal consent now.  All virtual visits are billed to your insurance company just like a normal visit would be.  By agreeing to a virtual visit, we'd like you to understand that the technology does not allow for your provider to perform an examination, and thus may limit your provider's ability to fully assess your condition. If your provider identifies any concerns that need to be evaluated in person, we will make arrangements to do so.  Finally, though the technology is pretty good, we cannot assure that it will always work on either your or our end, and in the setting of a video visit, we may have to convert it to a phone-only visit.  In either situation, we cannot ensure that we have a secure connection.  Are you willing to proceed?" STAFF: Did the patient verbally acknowledge consent to telehealth visit? Document  YES/NO here: yes 4.   5. Advise patient to be prepared - "Two hours prior to your appointment, go ahead and check your blood pressure, pulse, oxygen saturation, and your weight (if you have the equipment to check those) and write them all down. When your visit starts, your provider will ask you for this information. If you have an Apple Watch or Kardia device, please plan to have heart rate information ready on the day of your appointment. Please have a pen and paper handy nearby the day of the visit as well."  6. Give patient instructions for MyChart download to smartphone OR Doximity/Doxy.me as below if video visit (depending on what platform provider is using)  7. Inform patient they will receive a phone call 15 minutes prior to their appointment time (may be from unknown caller ID) so they should be prepared to answer    TELEPHONE CALL NOTE  [name] [name] has been deemed a candidate for a follow-up tele-health visit to limit community exposure during the Covid-19 pandemic. I spoke with the patient via phone to ensure availability of phone/video source, confirm preferred email & phone number, and discuss instructions and expectations.  I reminded [name] [name] to be prepared with any vital sign and/or heart rhythm information that could potentially be obtained via home monitoring, at the time of his visit. I reminded [name] [name] to expect a phone call  prior to his visit.  [name] [name] 05/02/2019 11:40 AM   INSTRUCTIONS FOR DOWNLOADING THE MYCHART APP TO SMARTPHONE  - The patient must first make sure to have activated MyChart and know their login information - If Apple, go to [employer] and type in MyChart in the search bar and download the app. If Android, ask patient to go to [employer] and type in [place] in the search bar and download the app. The app is free but as with any other app downloads, their phone may require them to verify saved payment information or  Apple/Android password.  - The patient will need to then log into the app with their MyChart username and password, and select [facility] [place] [facility] as their healthcare provider to link the account. When it is time for your visit, go to the MyChart app, find appointments, and click Begin Video Visit. Be sure to Select Allow for your device to access the Microphone and Camera for your visit. You will then be connected, and your provider will be with you shortly.  **If they have any issues connecting, or need assistance please contact MyChart service desk [phone])**  **If using a computer, in order to ensure the best quality for their visit they will need to use either of the following Internet Browsers: [employer], or Google Chrome**  IF USING DOXIMITY or DOXY.ME - The patient will receive a link just prior to their visit by text.     FULL LENGTH CONSENT FOR TELE-HEALTH VISIT   I hereby voluntarily request, consent and authorize [facility] and its employed or contracted physicians, physician assistants, nurse practitioners or other licensed health care professionals (the Practitioner), to provide me with telemedicine health care services (the Services") as deemed necessary by the treating Practitioner. I acknowledge and consent to receive the Services by the Practitioner via telemedicine. I understand that the telemedicine visit will involve communicating with the Practitioner through live audiovisual communication technology and the disclosure of certain medical information by electronic transmission. I acknowledge that I have been given the opportunity to request an in-person assessment or other available alternative prior to the telemedicine visit and am voluntarily participating in the telemedicine visit.  I understand that I have the right to withhold or withdraw my consent to the use of telemedicine in the course of my care at any time, without affecting my right to future care  or treatment, and that the Practitioner or I may terminate the telemedicine visit at any time. I understand that I have the right to inspect all information obtained and/or recorded in the course of the telemedicine visit and may receive copies of available information for a reasonable fee.  I understand that some of the potential risks of receiving the Services via telemedicine include:   Delay or interruption in medical evaluation due to technological equipment failure or disruption;  Information transmitted may not be sufficient (e.g. poor resolution of images) to allow for appropriate medical decision making by the Practitioner; and/or   In rare instances, security protocols could fail, causing a breach of personal health information.  Furthermore, I acknowledge that it is my responsibility to provide information about my medical history, conditions and care that is complete and accurate to the best of my ability. I acknowledge that Practitioner's advice, recommendations, and/or decision may be based on factors not within their control, such as incomplete or inaccurate data provided by me or distortions of diagnostic images or specimens that may result from electronic  transmissions. I understand that the practice of medicine is not an exact science and that Practitioner makes no warranties or guarantees regarding treatment outcomes. I acknowledge that I will receive a copy of this consent concurrently upon execution via email to the email address I last provided but may also request a printed copy by calling the office of [facility].    I understand that my insurance will be billed for this visit.   I have read or had this consent read to me.  I understand the contents of this consent, which adequately explains the benefits and risks of the Services being provided via telemedicine.   I have been provided ample opportunity to ask questions regarding this consent and the Services and have had  my questions answered to my satisfaction.  I give my informed consent for the services to be provided through the use of telemedicine in my medical care  By participating in this telemedicine visit I agree to the above.

## 2019-05-03 ENCOUNTER — Telehealth (INDEPENDENT_AMBULATORY_CARE_PROVIDER_SITE_OTHER): Payer: BC Managed Care – PPO | Admitting: Cardiology

## 2019-05-03 ENCOUNTER — Encounter: Payer: Self-pay | Admitting: Cardiology

## 2019-05-03 VITALS — BP 132/69 | Ht 72.0 in | Wt 247.0 lb

## 2019-05-03 DIAGNOSIS — I25119 Atherosclerotic heart disease of native coronary artery with unspecified angina pectoris: Secondary | ICD-10-CM

## 2019-05-03 DIAGNOSIS — E1165 Type 2 diabetes mellitus with hyperglycemia: Secondary | ICD-10-CM | POA: Diagnosis not present

## 2019-05-03 DIAGNOSIS — I10 Essential (primary) hypertension: Secondary | ICD-10-CM | POA: Diagnosis not present

## 2019-05-03 DIAGNOSIS — Z79899 Other long term (current) drug therapy: Secondary | ICD-10-CM

## 2019-05-03 DIAGNOSIS — E782 Mixed hyperlipidemia: Secondary | ICD-10-CM

## 2019-05-03 MED ORDER — ASPIRIN EC 81 MG PO TBEC: 81.0000 mg | DELAYED_RELEASE_TABLET | Freq: Every day | ORAL | 3 refills | Status: AC

## 2019-05-03 NOTE — Progress Notes (Signed)
Virtual Visit via Telephone Note   This visit type was conducted due to national recommendations for restrictions regarding the COVID-19 Pandemic (e.g. social distancing) in an effort to limit this patient's exposure and mitigate transmission in our community.  Due to his co-morbid illnesses, this patient is at least at moderate risk for complications without adequate follow up.  This format is felt to be most appropriate for this patient at this time.  The patient did not have access to video technology/had technical difficulties with video requiring transitioning to audio format only (telephone).  All issues noted in this document were discussed and addressed.  No physical exam could be performed with this format.  Please refer to the patient's chart for his  consent to telehealth for [facility].   Date:  05/03/2019   ID:  [name] [name], DOB 1955-12-14, MRN [id number]  Patient Location: Home Provider Location: Office  PCP:  Theresa Duty, PA  Cardiologist:  [name], MD Electrophysiologist:  None   Evaluation Performed:  Follow-Up Visit  Chief Complaint:   Cardiac follow-up  History of Present Illness:    [name] [name] is a 63 y.o. male last seen in November 2019.  I have video access and we spoke by phone.  He tells me that he has been doing well overall.  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  Follow-up Lexiscan Myoview in December 2019 was low risk as outlined below.  He does not report any active angina symptoms on medical therapy.  I did go over his medications.  He had been placed on Persantine years ago by a neurologist, we discussed stopping this and taking an aspirin 81 mg daily.  He sees Dr. [name] for follow-up of type 2 diabetes mellitus, recent hemoglobin A1c 6.6%.  His last LDL was 57 in July 2019.  This has not been updated.2  The patient does not have symptoms concerning for COVID-19 infection (fever, chills, cough, or new  shortness of breath).    Past Medical History:  Diagnosis Date  . Arthritis   . Cholelithiasis   . Coronary atherosclerosis of native coronary artery    DES RCA 10/05  . Essential hypertension   . Hyperlipidemia   . OA (osteoarthritis)   . Stroke [facility])    Left pons 6/11 - Aggrenox started  . Testicular cancer ([place]) 1978  . Type 2 diabetes mellitus ([place])   . Urolithiasis    Past Surgical History:  Procedure Laterality Date  . Colonoscopy    . CORONARY STENT PLACEMENT  2005  . Testicular tumor resection     Right side     Current Meds  Medication Sig  . bromocriptine (PARLODEL) 2.5 MG tablet TAKE ONE-HALF (1/2) TABLET AT BEDTIME  . colestipol (COLESTID) 1 g tablet TAKE 3 TABLETS DAILY  . fish oil-omega-3 fatty acids 1000 MG capsule Take 1 capsule by mouth 2 (two) times daily.   Marland Kitchen JARDIANCE 25 MG TABS tablet TAKE 1 TABLET DAILY  . metFORMIN (GLUCOPHAGE-XR) 500 MG 24 hr tablet TAKE 2 TABLETS DAILY  . Multiple Vitamin (MULTIVITAMIN) tablet Take 1 tablet by mouth daily.  . nitroGLYCERIN (NITROSTAT) 0.4 MG SL tablet Place 1 tablet (0.4 mg total) under the tongue every 5 (five) minutes x 3 doses as needed. May repeat every 5 minutes for up to 3 doses  . pioglitazone (ACTOS) 15 MG tablet TAKE 1 TABLET DAILY  . simvastatin (ZOCOR) 40 MG tablet Take 1 tablet (40 mg  total) by mouth every evening.  . TRULICITY 1.5 GX/2.1JH SOPN INJECT 1.5 MG INTO THE SKIN ONCE A WEEK  . [DISCONTINUED] dipyridamole (PERSANTINE) 50 MG tablet TAKE 1 TABLET THREE TIMES A DAY     Allergies:   Patient has no known allergies.   Social History   Tobacco Use  . Smoking status: Former Smoker    Packs/day: 1.50    Years: 43.00    Pack years: 64.50    Types: Cigarettes    Quit date: 04/21/2010    Years since quitting: 9.0  . Smokeless tobacco: Never Used  Substance Use Topics  . Alcohol use: Yes    Alcohol/week: 0.0 standard drinks    Comment: "Not much..1 (6 pack) a year"  . Drug use: No      Family Hx: The patient's family history includes Diabetes in his father; Heart attack in his father; Heart disease in his father. There is no history of Colon cancer, Esophageal cancer, Rectal cancer, Stomach cancer, or Colon polyps.  ROS:   Please see the history of present illness. All other systems reviewed and are negative.   Prior CV studies:   The following studies were reviewed today:  Lexiscan Myoview 09/19/2018:  No diagnostic ST segment changes to indicate ischemia.  No significant myocardial perfusion defects to indicate scar or ischemia.  This is a low risk study.  Nuclear stress EF: 75%.  Labs/Other Tests and Data Reviewed:    EKG:  An ECG dated 04/01/2018 was personally reviewed today and demonstrated:  Sinus rhythm.  Recent Labs:  No interval lab work for review.  Wt Readings from Last 3 Encounters:  05/03/19 247 lb (112 kg)  04/07/19 254 lb 6.4 oz (115.4 kg)  04/05/19 247 lb (112 kg)     Objective:    Vital Signs:  BP 132/69   Ht 6' (1.829 m)   Wt 247 lb (112 kg)   BMI 33.50 kg/m    Patient spoke in full sentences, not short of breath. No audible wheezing or coughing. Speech pattern normal.  ASSESSMENT & PLAN:    1.  CAD status post DES to the RCA in 2005.  He had a low risk Myoview study in December of last year with interval DOT physical.  He does not report any active angina on medical therapy.  As noted above, he is going to stop Persantine which had been prescribed by neurologist years ago and start taking aspirin 81 mg daily.  2.  Type 2 diabetes mellitus, continues to follow with endocrinology.  Last hemoglobin A1c was 6.6%.  3.  Mixed hyperlipidemia on Zocor.  Follow-up FLP and LFTs.  4.  Essential hypertension by history.  Systolics in the 417E today.  COVID-19 Education: The signs and symptoms of COVID-19 were discussed with the patient and how to seek care for testing (follow up with PCP or arrange E-visit).  The importance of social  distancing was discussed today.  Time:   Today, I have spent 6 minutes with the patient with telehealth technology discussing the above problems.     Medication Adjustments/Labs and Tests Ordered: Current medicines are reviewed at length with the patient today.  Concerns regarding medicines are outlined above.   Tests Ordered: Orders Placed This Encounter  Procedures  . Hepatic function panel  . Lipid panel    Medication Changes: Meds ordered this encounter  Medications  . aspirin EC 81 MG tablet    Sig: Take 1 tablet (81 mg total) by mouth  daily.    Dispense:  90 tablet    Refill:  3    05/03/2019 NEW-stop persantine    Follow Up:  In Person 6 months in the [place] office.  Signed, [name], MD  05/03/2019 9:13 AM    [place]

## 2019-05-03 NOTE — Patient Instructions (Addendum)
Medication Instructions:   Your physician has recommended you make the following change in your medication:   Stop persantine  Start aspirin 81 mg by mouth daily  Continue all other medications the same  Labwork:  Your physician recommends that you return for a FASTING lipid/liver profile. Please do not eat or drink for at least 8 hours when you have this done. Your lab order has been faxed to [facility].  Testing/Procedures:  NONE  Follow-Up:  Your physician recommends that you schedule a follow-up appointment in: 6 months. You will receive a reminder letter in the mail in about 4 months reminding you to call and schedule your appointment. If you don't receive this letter, please contact our office.  Any Other Special Instructions Will Be Listed Below (If Applicable).  If you need a refill on your cardiac medications before your next appointment, please call your pharmacy.

## 2019-05-05 ENCOUNTER — Encounter: Payer: Self-pay | Admitting: Endocrinology

## 2019-05-05 DIAGNOSIS — Z79899 Other long term (current) drug therapy: Secondary | ICD-10-CM | POA: Diagnosis not present

## 2019-05-05 DIAGNOSIS — I25119 Atherosclerotic heart disease of native coronary artery with unspecified angina pectoris: Secondary | ICD-10-CM | POA: Diagnosis not present

## 2019-05-05 DIAGNOSIS — E782 Mixed hyperlipidemia: Secondary | ICD-10-CM | POA: Diagnosis not present

## 2019-05-08 ENCOUNTER — Telehealth: Payer: Self-pay | Admitting: *Deleted

## 2019-05-08 NOTE — Telephone Encounter (Signed)
Patient informed. Copy sent to PCP °

## 2019-05-08 NOTE — Telephone Encounter (Signed)
-----   Message from Erma Heritage, [place] sent at 05/08/2019 11:48 AM EDT ----- Covering for Dr. [name] - Please let the patient know his liver function remains within a normal range. Cholesterol stable with LDL at 67 which is at goal of less than 70. Continue current medication regimen. Please forward a copy of results to Allwardt, Alyssa, PA.

## 2019-06-06 ENCOUNTER — Other Ambulatory Visit: Payer: Self-pay | Admitting: Cardiology

## 2019-06-06 ENCOUNTER — Other Ambulatory Visit: Payer: Self-pay | Admitting: Endocrinology

## 2019-10-02 ENCOUNTER — Other Ambulatory Visit: Payer: Self-pay | Admitting: Endocrinology

## 2019-10-13 ENCOUNTER — Ambulatory Visit: Payer: BC Managed Care – PPO | Admitting: Endocrinology

## 2019-11-13 ENCOUNTER — Other Ambulatory Visit: Payer: Self-pay

## 2019-11-15 ENCOUNTER — Ambulatory Visit: Payer: Self-pay | Admitting: Endocrinology

## 2019-11-21 ENCOUNTER — Other Ambulatory Visit: Payer: Self-pay

## 2019-11-23 ENCOUNTER — Encounter: Payer: Self-pay | Admitting: Endocrinology

## 2019-11-23 ENCOUNTER — Ambulatory Visit (INDEPENDENT_AMBULATORY_CARE_PROVIDER_SITE_OTHER): Payer: BC Managed Care – PPO | Admitting: Endocrinology

## 2019-11-23 ENCOUNTER — Other Ambulatory Visit: Payer: Self-pay

## 2019-11-23 VITALS — BP 132/80 | HR 95 | Ht 72.0 in | Wt 243.4 lb

## 2019-11-23 DIAGNOSIS — E1151 Type 2 diabetes mellitus with diabetic peripheral angiopathy without gangrene: Secondary | ICD-10-CM

## 2019-11-23 LAB — POCT GLYCOSYLATED HEMOGLOBIN (HGB A1C): Hemoglobin A1C: 8.7 % — AB (ref 4.0–5.6)

## 2019-11-23 MED ORDER — ONETOUCH VERIO VI STRP: 1.0000 | ORAL_STRIP | Freq: Every day | 3 refills | Status: DC

## 2019-11-23 MED ORDER — TRULICITY 3 MG/0.5ML ~~LOC~~ SOAJ: 3.0000 mg | SUBCUTANEOUS | 3 refills | Status: DC

## 2019-11-23 NOTE — Patient Instructions (Addendum)
I have sent a prescription to your pharmacy, to double the trulicity. Our goal is to get the blood sugar back to the 100's. Please call if this does happen, so we can increase the trulicity again.   Please continue the same medications. Here is a new meter.  I have sent a prescription to your pharmacy, for strips. check your blood sugar once a day.  vary the time of day when you check, between before the 3 meals, and at bedtime.  also check if you have symptoms of your blood sugar being too high or too low.  please keep a record of the readings and bring it to your next appointment here (or you can bring the meter itself).  You can write it on any piece of paper.  please call us sooner if your blood sugar goes below 70, or if you have a lot of readings over 200. Please come back for a follow-up appointment in 2 months.

## 2019-11-23 NOTE — Progress Notes (Signed)
Subjective:    Patient ID: [name] [name], male    DOB: 1956/09/24, 64 y.o.   MRN: [id number]  HPI Pt returns for f/u of diabetes mellitus:  DM type: 2 Dx'ed: Q000111Q Complications: CAD and CVA.   Therapy: Trulicity and 5 oral meds.  DKA: never Severe hypoglycemia: never.   Pancreatitis: never.   Other: he has never taken insulin; he works as a Pharmacist, community.  Interval history: pt states he feels better in general.  He takes meds as rx'ed.  He had coronavirus 1/21-2/21.  He has been off steroids x 2 weeks.  cbg's have improved to the 200's.   Past Medical History:  Diagnosis Date  . Arthritis   . Cholelithiasis   . Coronary atherosclerosis of native coronary artery    DES RCA 10/05  . Essential hypertension   . Hyperlipidemia   . OA (osteoarthritis)   . Stroke [facility])    Left pons 6/11 - Aggrenox started  . Testicular cancer ([place]) 1978  . Type 2 diabetes mellitus ([place])   . Urolithiasis     Past Surgical History:  Procedure Laterality Date  . Colonoscopy    . CORONARY STENT PLACEMENT  2005  . Testicular tumor resection     Right side    Social History   Socioeconomic History  . Marital status: Married    Spouse name: [name]  . Number of children: 0  . Years of education: Not on file  . Highest education level: Not on file  Occupational History  . Occupation: Truck Geophysicist/field seismologist  Tobacco Use  . Smoking status: Former Smoker    Packs/day: 1.50    Years: 43.00    Pack years: 64.50    Types: Cigarettes    Quit date: 04/21/2010    Years since quitting: 9.6  . Smokeless tobacco: Never Used  Substance and Sexual Activity  . Alcohol use: Yes    Alcohol/week: 0.0 standard drinks    Comment: "Not much..1 (6 pack) a year"  . Drug use: No  . Sexual activity: Not on file  Other Topics Concern  . Not on file  Social History Narrative  . Not on file   Social Determinants of Health   Financial Resource Strain:   . Difficulty of Paying Living Expenses: Not on file  Food  Insecurity:   . Worried About Charity fundraiser in the Last Year: Not on file  . Ran Out of Food in the Last Year: Not on file  Transportation Needs:   . Lack of Transportation (Medical): Not on file  . Lack of Transportation (Non-Medical): Not on file  Physical Activity:   . Days of Exercise per Week: Not on file  . Minutes of Exercise per Session: Not on file  Stress:   . Feeling of Stress : Not on file  Social Connections:   . Frequency of Communication with Friends and Family: Not on file  . Frequency of Social Gatherings with Friends and Family: Not on file  . Attends Religious Services: Not on file  . Active Member of Clubs or Organizations: Not on file  . Attends Archivist Meetings: Not on file  . Marital Status: Not on file  Intimate Partner Violence:   . Fear of Current or Ex-Partner: Not on file  . Emotionally Abused: Not on file  . Physically Abused: Not on file  . Sexually Abused: Not on file    Current Outpatient Medications on File Prior to Visit  Medication  Sig Dispense Refill  . aspirin EC 81 MG tablet Take 1 tablet (81 mg total) by mouth daily. 90 tablet 3  . bromocriptine (PARLODEL) 2.5 MG tablet TAKE ONE-HALF (1/2) TABLET AT BEDTIME 45 tablet 3  . colestipol (COLESTID) 1 g tablet TAKE 3 TABLETS DAILY 90 tablet 8  . fish oil-omega-3 fatty acids 1000 MG capsule Take 1 capsule by mouth 2 (two) times daily.     Marland Kitchen JARDIANCE 25 MG TABS tablet TAKE 1 TABLET DAILY 90 tablet 3  . metFORMIN (GLUCOPHAGE-XR) 500 MG 24 hr tablet TAKE 2 TABLETS DAILY 180 tablet 4  . Multiple Vitamin (MULTIVITAMIN) tablet Take 1 tablet by mouth daily.    . nitroGLYCERIN (NITROSTAT) 0.4 MG SL tablet Place 1 tablet (0.4 mg total) under the tongue every 5 (five) minutes x 3 doses as needed. May repeat every 5 minutes for up to 3 doses 75 tablet 3  . pioglitazone (ACTOS) 15 MG tablet TAKE 1 TABLET DAILY 90 tablet 3  . simvastatin (ZOCOR) 40 MG tablet TAKE 1 TABLET EVERY EVENING 90  tablet 3   No current facility-administered medications on file prior to visit.    No Known Allergies  Family History  Problem Relation Age of Onset  . Heart disease Father   . Diabetes Father   . Heart attack Father   . Colon cancer Neg Hx   . Esophageal cancer Neg Hx   . Rectal cancer Neg Hx   . Stomach cancer Neg Hx   . Colon polyps Neg Hx     BP 132/80 (BP Location: Left Arm, Patient Position: Sitting, Cuff Size: Large)   Pulse 95   Ht 6' (1.829 m)   Wt 243 lb 6.4 oz (110.4 kg)   SpO2 93%   BMI 33.01 kg/m     Review of Systems He denies hypoglycemia.     Objective:   Physical Exam VITAL SIGNS:  See vs page GENERAL: no distress Pulses: dorsalis pedis intact bilat.   MSK: no deformity of the feet CV: 1+ bilat leg edema Skin:  no ulcer on the feet.  normal color and temp on the feet. Neuro: sensation is intact to touch on the feet.    Lab Results  Component Value Date   HGBA1C 8.7 (A) 11/23/2019       Assessment & Plan:  Type 2 DM, with CAD: worse Coronavirus: steroid rx is prob increasing A1c.  Edema: follow for now.  D/c pioglitizone when a1c is better.   Patient Instructions  I have sent a prescription to your pharmacy, to double the trulicity. Our goal is to get the blood sugar back to the 100's. Please call if this does happen, so we can increase the trulicity again.   Please continue the same medications. Here is a new meter.  I have sent a prescription to your pharmacy, for strips. check your blood sugar once a day.  vary the time of day when you check, between before the 3 meals, and at bedtime.  also check if you have symptoms of your blood sugar being too high or too low.  please keep a record of the readings and bring it to your next appointment here (or you can bring the meter itself).  You can write it on any piece of paper.  please call us sooner if your blood sugar goes below 70, or if you have a lot of readings over 200. Please come back for  a follow-up appointment in 2 months.

## 2019-11-27 ENCOUNTER — Encounter: Payer: Self-pay | Admitting: *Deleted

## 2019-11-27 ENCOUNTER — Telehealth: Payer: Self-pay | Admitting: Cardiology

## 2019-11-27 NOTE — Telephone Encounter (Signed)
Patient informed. Advised that letter of clearance will be sent to his provider today. Verbalized understanding.

## 2019-11-27 NOTE — Telephone Encounter (Signed)
A letter can be prepared and sent to his provider that performed the DOT clearance.  As of our last telehealth encounter he was doing well without progressive angina symptoms on medical therapy.  He has a prior history of DES to the RCA in 2005.  Follow-up Lexiscan Myoview in December 2019 was low risk.  I do not anticipate any further cardiac testing at this point and he should keep his routine scheduled visit.  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

## 2019-11-27 NOTE — Telephone Encounter (Signed)
Contacted patient to get more information about his request. 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 Dr. Stevan Born is the physician patient says he saw. Advised that [facility] will be contacted for more information so that our office will know what is needed. Patient says he need this letter before Friday this week. Verbalized understanding.   Reviewed encounter for [facility]/Dr. 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

## 2019-11-27 NOTE — Telephone Encounter (Signed)
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

## 2019-12-04 ENCOUNTER — Other Ambulatory Visit: Payer: Self-pay | Admitting: Endocrinology

## 2019-12-15 ENCOUNTER — Ambulatory Visit: Payer: Self-pay | Admitting: Cardiology

## 2019-12-21 ENCOUNTER — Other Ambulatory Visit: Payer: Self-pay | Admitting: Cardiology

## 2020-01-19 ENCOUNTER — Encounter: Payer: Self-pay | Admitting: Cardiology

## 2020-01-19 ENCOUNTER — Ambulatory Visit (INDEPENDENT_AMBULATORY_CARE_PROVIDER_SITE_OTHER): Payer: BC Managed Care – PPO | Admitting: Cardiology

## 2020-01-19 ENCOUNTER — Other Ambulatory Visit: Payer: Self-pay

## 2020-01-19 ENCOUNTER — Ambulatory Visit: Payer: Self-pay | Admitting: Cardiology

## 2020-01-19 VITALS — BP 140/70 | HR 77 | Ht 72.0 in | Wt 249.0 lb

## 2020-01-19 DIAGNOSIS — E782 Mixed hyperlipidemia: Secondary | ICD-10-CM | POA: Diagnosis not present

## 2020-01-19 DIAGNOSIS — I25119 Atherosclerotic heart disease of native coronary artery with unspecified angina pectoris: Secondary | ICD-10-CM | POA: Diagnosis not present

## 2020-01-19 NOTE — Patient Instructions (Signed)

## 2020-01-19 NOTE — Progress Notes (Signed)
Cardiology Office Note  Date: 01/19/2020   ID: [name] [name], DOB 11-18-1955, MRN [id number]  PCP:  Theresa Duty, PA  Cardiologist:  [name], MD Electrophysiologist:  None   Chief Complaint  Patient presents with  . Cardiac follow-up    History of Present Illness: [name] [name] is a 64 y.o. male last assessed via telehealth encounter in August 2020.  He presents for a routine visit.  He reports no major change from a cardiac perspective, no obvious angina symptoms.  He did have COVID-19 earlier this year, it sounds like he had hypoxic respiratory failure with pneumonia and was in the hospital for 17 days.  He states that he is "90%" back to baseline.  It took him a while to recover.  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  I reviewed his cardiac regimen which is stable and outlined below.  ECG today shows sinus rhythm with nonspecific T wave changes.  Last stress test was in 2019 as reviewed below, low risk.  He continues to follow with endocrinology.  Past Medical History:  Diagnosis Date  . Arthritis   . Cholelithiasis   . Coronary atherosclerosis of native coronary artery    DES RCA 10/05  . Essential hypertension   . Hyperlipidemia   . OA (osteoarthritis)   . Stroke [facility])    Left pons 6/11 - Aggrenox started  . Testicular cancer ([place]) 1978  . Type 2 diabetes mellitus ([place])   . Urolithiasis     Past Surgical History:  Procedure Laterality Date  . Colonoscopy    . CORONARY STENT PLACEMENT  2005  . Testicular tumor resection     Right side    Current Outpatient Medications  Medication Sig Dispense Refill  . aspirin EC 81 MG tablet Take 1 tablet (81 mg total) by mouth daily. 90 tablet 3  . bromocriptine (PARLODEL) 2.5 MG tablet TAKE ONE-HALF (1/2) TABLET AT BEDTIME 45 tablet 3  . colestipol (COLESTID) 1 g tablet TAKE 3 TABLETS DAILY 90 tablet 8  . Dulaglutide (TRULICITY) 3 0000000 SOPN Inject 3 mg into the  skin once a week. 12 pen 3  . fish oil-omega-3 fatty acids 1000 MG capsule Take 1 capsule by mouth 2 (two) times daily.     Marland Kitchen glucose blood (ONETOUCH VERIO) test strip 1 each by Other route daily. And lancets 1/day 100 each 3  . JARDIANCE 25 MG TABS tablet TAKE 1 TABLET DAILY 90 tablet 3  . metFORMIN (GLUCOPHAGE-XR) 500 MG 24 hr tablet TAKE 2 TABLETS DAILY 180 tablet 3  . Multiple Vitamin (MULTIVITAMIN) tablet Take 1 tablet by mouth daily.    . nitroGLYCERIN (NITROSTAT) 0.4 MG SL tablet DISSOLVE 1 TABLET UNDER THE TONGUE EVERY 5 MINUTES FOR 3 DOSES AS NEEDED. MAY REPEAT EVERY 5 MINUTES FOR UP TO 3 DOSES. 25 tablet 3  . pioglitazone (ACTOS) 15 MG tablet TAKE 1 TABLET DAILY 90 tablet 3  . simvastatin (ZOCOR) 40 MG tablet TAKE 1 TABLET EVERY EVENING 90 tablet 3   No current facility-administered medications for this visit.   Allergies:  Patient has no known allergies.   ROS:   No palpitations or syncope.  Physical Exam: VS:  BP 140/70   Pulse 77   Ht 6' (1.829 m)   Wt 249 lb (112.9 kg)   SpO2 97%   BMI 33.77 kg/m , BMI Body mass index is 33.77 kg/m.  Wt Readings from Last  3 Encounters:  01/19/20 249 lb (112.9 kg)  11/23/19 243 lb 6.4 oz (110.4 kg)  05/03/19 247 lb (112 kg)    General: Patient appears comfortable at rest. HEENT: Conjunctiva and lids normal, wearing a mask. Neck: Supple, no elevated JVP or carotid bruits, no thyromegaly. Lungs: Clear to auscultation, nonlabored breathing at rest. Cardiac: Regular rate and rhythm, no S3 or significant systolic murmur. Abdomen: Soft, bowel sounds present. Extremities: No pitting edema, distal pulses 2+.  ECG:  An ECG dated 04/01/2018 was personally reviewed today and demonstrated:  Sinus rhythm.  Recent Labwork:  August 2020: Cholesterol 160, triglycerides 130, HDL 67, LDL 67, AST 23, ALT 30 A1c 8.7%  Other Studies Reviewed Today:  Lexiscan Myoview 09/19/2018:  No diagnostic ST segment changes to indicate ischemia.  No  significant myocardial perfusion defects to indicate scar or ischemia.  This is a low risk study.  Nuclear stress EF: 75%.  Assessment and Plan:  1.  CAD status post DES to the RCA in 2005.  He is doing well at this point without active angina, had low risk Myoview study back in 2019.  I reviewed his ECG today.  We will continue with medical therapy and observation.  Continue aspirin, Zocor, and as needed nitroglycerin.  2.  Mixed hyperlipidemia, he continues on Zocor.  Last LDL 67.  Medication Adjustments/Labs and Tests Ordered: Current medicines are reviewed at length with the patient today.  Concerns regarding medicines are outlined above.   Tests Ordered: Orders Placed This Encounter  Procedures  . EKG 12-Lead    Medication Changes: No orders of the defined types were placed in this encounter.   Disposition:  Follow up 6 months in the [place] office.  Signed, Satira Sark, MD, [facility] 01/19/2020 4:52 PM    [facility] [place], [place], [place] [address] Phone: [phone]; Fax: [phone]

## 2020-01-24 ENCOUNTER — Other Ambulatory Visit: Payer: Self-pay

## 2020-01-26 ENCOUNTER — Encounter: Payer: Self-pay | Admitting: Endocrinology

## 2020-01-26 ENCOUNTER — Other Ambulatory Visit: Payer: Self-pay

## 2020-01-26 ENCOUNTER — Ambulatory Visit: Payer: BC Managed Care – PPO | Admitting: Endocrinology

## 2020-01-26 VITALS — BP 126/72 | HR 73 | Ht 72.0 in | Wt 247.0 lb

## 2020-01-26 DIAGNOSIS — E1151 Type 2 diabetes mellitus with diabetic peripheral angiopathy without gangrene: Secondary | ICD-10-CM

## 2020-01-26 DIAGNOSIS — R200 Anesthesia of skin: Secondary | ICD-10-CM

## 2020-01-26 LAB — POCT GLYCOSYLATED HEMOGLOBIN (HGB A1C): Hemoglobin A1C: 6.2 % — AB (ref 4.0–5.6)

## 2020-01-26 LAB — TSH: TSH: 1.27 u[IU]/mL (ref 0.35–4.50)

## 2020-01-26 LAB — VITAMIN B12: Vitamin B-12: 166 pg/mL — ABNORMAL LOW (ref 211–911)

## 2020-01-26 NOTE — Patient Instructions (Addendum)
You can stay off the colestipol Please continue the same other medications.  Blood tests are requested for you today.  We'll let you know about the results.  check your blood sugar once a day.  vary the time of day when you check, between before the 3 meals, and at bedtime.  also check if you have symptoms of your blood sugar being too high or too low.  please keep a record of the readings and bring it to your next appointment here (or you can bring the meter itself).  You can write it on any piece of paper.  please call us sooner if your blood sugar goes below 70, or if you have a lot of readings over 200. Please come back for a follow-up appointment in 3-4 months.

## 2020-01-26 NOTE — Progress Notes (Signed)
Subjective:    Patient ID: [name] [name], male    DOB: [birth year], 64 y.o.   MRN: [id number]  HPI Pt returns for f/u of diabetes mellitus:  DM type: 2 Dx'ed: Q000111Q Complications: CAD, PN and CVA.   Therapy: Trulicity and 5 oral meds.  DKA: never Severe hypoglycemia: never.   Pancreatitis: never.   Other: he has never taken insulin; he works as a Nurse, adult.  Interval history: pt states he feels better in general.  He takes meds as rx'ed.  He stopped colestipol, due to abd pain.  He will retire soon.   Past Medical History:  Diagnosis Date  . Arthritis   . Cholelithiasis   . Coronary atherosclerosis of native coronary artery    DES RCA 10/05  . Essential hypertension   . Hyperlipidemia   . OA (osteoarthritis)   . Stroke [facility])    Left pons 6/11 - Aggrenox started  . Testicular cancer ([place]) 1978  . Type 2 diabetes mellitus ([place])   . Urolithiasis     Past Surgical History:  Procedure Laterality Date  . Colonoscopy    . CORONARY STENT PLACEMENT  2005  . Testicular tumor resection     Right side    Social History   Socioeconomic History  . Marital status: Married    Spouse name: [name]  . Number of children: 0  . Years of education: Not on file  . Highest education level: Not on file  Occupational History  . Occupation: Truck Geophysicist/field seismologist  Tobacco Use  . Smoking status: Former Smoker    Packs/day: 1.50    Years: 43.00    Pack years: 64.50    Types: Cigarettes    Quit date: 04/21/2010    Years since quitting: 9.7  . Smokeless tobacco: Never Used  Substance and Sexual Activity  . Alcohol use: Yes    Alcohol/week: 0.0 standard drinks    Comment: "Not much..1 (6 pack) a year"  . Drug use: No  . Sexual activity: Not on file  Other Topics Concern  . Not on file  Social History Narrative  . Not on file   Social Determinants of Health   Financial Resource Strain:   . Difficulty of Paying Living Expenses:   Food Insecurity:   . Worried About Sales executive in the Last Year:   . Arboriculturist in the Last Year:   Transportation Needs:   . Film/video editor (Medical):   Marland Kitchen Lack of Transportation (Non-Medical):   Physical Activity:   . Days of Exercise per Week:   . Minutes of Exercise per Session:   Stress:   . Feeling of Stress :   Social Connections:   . Frequency of Communication with Friends and Family:   . Frequency of Social Gatherings with Friends and Family:   . Attends Religious Services:   . Active Member of Clubs or Organizations:   . Attends Archivist Meetings:   Marland Kitchen Marital Status:   Intimate Partner Violence:   . Fear of Current or Ex-Partner:   . Emotionally Abused:   Marland Kitchen Physically Abused:   . Sexually Abused:     Current Outpatient Medications on File Prior to Visit  Medication Sig Dispense Refill  . aspirin EC 81 MG tablet Take 1 tablet (81 mg total) by mouth daily. 90 tablet 3  . bromocriptine (PARLODEL) 2.5 MG tablet TAKE ONE-HALF (1/2) TABLET AT BEDTIME 45 tablet 3  . Dulaglutide (TRULICITY)  3 MG/0.5ML SOPN Inject 3 mg into the skin once a week. 12 pen 3  . fish oil-omega-3 fatty acids 1000 MG capsule Take 1 capsule by mouth 2 (two) times daily.     Marland Kitchen glucose blood (ONETOUCH VERIO) test strip 1 each by Other route daily. And lancets 1/day 100 each 3  . JARDIANCE 25 MG TABS tablet TAKE 1 TABLET DAILY 90 tablet 3  . metFORMIN (GLUCOPHAGE-XR) 500 MG 24 hr tablet TAKE 2 TABLETS DAILY 180 tablet 3  . Multiple Vitamin (MULTIVITAMIN) tablet Take 1 tablet by mouth daily.    . nitroGLYCERIN (NITROSTAT) 0.4 MG SL tablet DISSOLVE 1 TABLET UNDER THE TONGUE EVERY 5 MINUTES FOR 3 DOSES AS NEEDED. MAY REPEAT EVERY 5 MINUTES FOR UP TO 3 DOSES. 25 tablet 3  . pioglitazone (ACTOS) 15 MG tablet TAKE 1 TABLET DAILY 90 tablet 3  . simvastatin (ZOCOR) 40 MG tablet TAKE 1 TABLET EVERY EVENING 90 tablet 3   No current facility-administered medications on file prior to visit.    No Known Allergies  Family History    Problem Relation Age of Onset  . Heart disease Father   . Diabetes Father   . Heart attack Father   . Colon cancer Neg Hx   . Esophageal cancer Neg Hx   . Rectal cancer Neg Hx   . Stomach cancer Neg Hx   . Colon polyps Neg Hx     BP 126/72   Pulse 73   Ht 6' (1.829 m)   Wt 247 lb (112 kg)   SpO2 93%   BMI 33.50 kg/m    Review of Systems He reports tingling of the feet.      Objective:   Physical Exam VITAL SIGNS:  See vs page GENERAL: no distress Pulses: dorsalis pedis intact bilat.   MSK: no deformity of the feet CV: no leg edema Skin:  no ulcer on the feet.  normal color and temp on the feet. Neuro: sensation is intact to touch on the feet, but decreased from normal.    Lab Results  Component Value Date   HGBA1C 6.2 (A) 01/26/2020   B-12=166    Assessment & Plan:  Type 2 DM, with CAD: well-controlled Side-effect: abd pain due to colestipol B-12 def, new: I advised PO, 1 mg qd.  Patient Instructions  You can stay off the colestipol Please continue the same other medications.  Blood tests are requested for you today.  We'll let you know about the results.  check your blood sugar once a day.  vary the time of day when you check, between before the 3 meals, and at bedtime.  also check if you have symptoms of your blood sugar being too high or too low.  please keep a record of the readings and bring it to your next appointment here (or you can bring the meter itself).  You can write it on any piece of paper.  please call us sooner if your blood sugar goes below 70, or if you have a lot of readings over 200. Please come back for a follow-up appointment in 3-4 months.

## 2020-02-06 ENCOUNTER — Other Ambulatory Visit: Payer: Self-pay | Admitting: Endocrinology

## 2020-02-23 ENCOUNTER — Ambulatory Visit (INDEPENDENT_AMBULATORY_CARE_PROVIDER_SITE_OTHER): Payer: BC Managed Care – PPO | Admitting: Podiatry

## 2020-02-23 ENCOUNTER — Other Ambulatory Visit: Payer: Self-pay

## 2020-02-23 DIAGNOSIS — M722 Plantar fascial fibromatosis: Secondary | ICD-10-CM

## 2020-02-23 DIAGNOSIS — M216X9 Other acquired deformities of unspecified foot: Secondary | ICD-10-CM

## 2020-02-23 MED ORDER — MELOXICAM 15 MG PO TABS
15.0000 mg | ORAL_TABLET | Freq: Every day | ORAL | 0 refills | Status: DC
Start: 2020-02-23 — End: 2020-10-04

## 2020-02-23 NOTE — Progress Notes (Signed)
  Subjective:  Patient ID: [name] [name], male    DOB: 1955/12/03,  MRN: [id number]  Chief Complaint  Patient presents with  . Foot Orthotics    Pt states has orthotics from 2013 that need updating/replacing  . Plantar Fasciitis    Pt states flare up in his left, 2 month duration    64 y.o. male presents with the above complaint. History confirmed with patient.   Objective:  Physical Exam: warm, good capillary refill, no trophic changes or ulcerative lesions, normal DP and PT pulses and normal sensory exam. Left Foot: tenderness to palpation medial calcaneal tuber, no pain with calcaneal squeeze, decreased ankle joint ROM and +Silverskiold test  Assessment:   1. Plantar fasciitis   2. Equinus deformity of foot      Plan:  Patient was evaluated and treated and all questions answered.  Plantar Fasciitis -Educated patient on stretching and icing of the affected limb -Rx for meloxicam. Educated on use, risks and benefits of the medication  -Would benefit from new CMOs. Impression made. Will fabricate.  Return in about 6 weeks (around 04/05/2020).

## 2020-03-08 ENCOUNTER — Ambulatory Visit: Payer: Self-pay | Admitting: Cardiology

## 2020-03-22 ENCOUNTER — Other Ambulatory Visit: Payer: Self-pay

## 2020-03-22 ENCOUNTER — Encounter: Payer: Medicare Other | Admitting: Orthotics

## 2020-05-03 ENCOUNTER — Other Ambulatory Visit: Payer: Self-pay

## 2020-05-03 ENCOUNTER — Ambulatory Visit (INDEPENDENT_AMBULATORY_CARE_PROVIDER_SITE_OTHER): Payer: BC Managed Care – PPO | Admitting: Podiatry

## 2020-05-03 DIAGNOSIS — M722 Plantar fascial fibromatosis: Secondary | ICD-10-CM | POA: Diagnosis not present

## 2020-05-04 NOTE — Progress Notes (Signed)
  Subjective:  Patient ID: [name] [name], male    DOB: [birth year],  MRN: [id number]  Chief Complaint  Patient presents with  . Plantar Fasciitis    L heel - follow-up. Pt stated, "The orthotics are helpful, but I still have a lot of heel pain. It comes and goes. Pain is usually 5-6/10, but it can be 10/10. Using ice and stretching doesn't help". The severe pain is triggered by standing after sitting.    64 y.o. male presents with the above complaint. History confirmed with patient.  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  He does not wear any type of brace or use any night splint.  Ice and stretching so far has not really helped him.  Objective:  Physical Exam: warm, good capillary refill, no trophic changes or ulcerative lesions, normal DP and PT pulses and normal sensory exam. Left Foot: point tenderness over the heel pad    Assessment:   1. Plantar fasciitis, left      Plan:  Patient was evaluated and treated and all questions answered.   -Educated patient on stretching and icing of the affected limb -Plantar fascial brace dispensed  -Discussed injection for him, he is not interested in this, he is concerned about hyperglycemia due to his diabetes.  Has also been advised not to take NSAIDs due to his diabetes as well -Recommended he start physical therapy, referral was placed to [facility] in [place] -Also discussed eventual treatment with either EPAT shockwave therapy or surgical endoscopic plantar fasciotomy   Return in about 6 weeks (around 06/14/2020) for recheck plantar fasciitis.

## 2020-05-06 NOTE — Addendum Note (Signed)
Addended by: Frazier Butt H on: 05/06/2020 11:52 AM   Modules accepted: Orders

## 2020-05-08 ENCOUNTER — Telehealth: Payer: Self-pay | Admitting: Podiatry

## 2020-05-08 DIAGNOSIS — M722 Plantar fascial fibromatosis: Secondary | ICD-10-CM

## 2020-05-08 NOTE — Telephone Encounter (Signed)
pts wife left message yesterday stating pt wanted an additional pair of orthotics   I returned call to pt and he wants a 2nd pr for the 219.00 and wants them just like the last ones he just received. I told pt I would call when they come in.

## 2020-05-27 ENCOUNTER — Other Ambulatory Visit: Payer: Self-pay | Admitting: Endocrinology

## 2020-05-31 ENCOUNTER — Ambulatory Visit: Payer: BC Managed Care – PPO | Admitting: Endocrinology

## 2020-06-20 ENCOUNTER — Ambulatory Visit: Payer: Medicare Other | Admitting: Podiatry

## 2020-06-24 ENCOUNTER — Ambulatory Visit: Payer: Medicare Other | Admitting: Podiatry

## 2020-06-28 ENCOUNTER — Ambulatory Visit (INDEPENDENT_AMBULATORY_CARE_PROVIDER_SITE_OTHER): Payer: Medicare Other | Admitting: Podiatry

## 2020-06-28 ENCOUNTER — Other Ambulatory Visit: Payer: Self-pay

## 2020-06-28 DIAGNOSIS — M7662 Achilles tendinitis, left leg: Secondary | ICD-10-CM | POA: Diagnosis not present

## 2020-06-28 DIAGNOSIS — M21862 Other specified acquired deformities of left lower leg: Secondary | ICD-10-CM

## 2020-06-28 DIAGNOSIS — M216X2 Other acquired deformities of left foot: Secondary | ICD-10-CM | POA: Diagnosis not present

## 2020-06-28 DIAGNOSIS — M722 Plantar fascial fibromatosis: Secondary | ICD-10-CM | POA: Diagnosis not present

## 2020-06-28 NOTE — Patient Instructions (Addendum)
Look for a Tuli's heel cu and wear in your shoes:     Achilles Tendinitis  with Rehab Achilles tendinitis is a disorder of the Achilles tendon. The Achilles tendon connects the large calf muscles (Gastrocnemius and Soleus) to the heel bone (calcaneus). This tendon is sometimes called the heel cord. It is important for pushing-off and standing on your toes and is important for walking, running, or jumping. Tendinitis is often caused by overuse and repetitive microtrauma. SYMPTOMS  Pain, tenderness, swelling, warmth, and redness may occur over the Achilles tendon even at rest.  Pain with pushing off, or flexing or extending the ankle.  Pain that is worsened after or during activity. CAUSES   Overuse sometimes seen with rapid increase in exercise programs or in sports requiring running and jumping.  Poor physical conditioning (strength and flexibility or endurance).  Running sports, especially training running down hills.  Inadequate warm-up before practice or play or failure to stretch before participation.  Injury to the tendon. PREVENTION   Warm up and stretch before practice or competition.  Allow time for adequate rest and recovery between practices and competition.  Keep up conditioning.  Keep up ankle and leg flexibility.  Improve or keep muscle strength and endurance.  Improve cardiovascular fitness.  Use proper technique.  Use proper equipment (shoes, skates).  To help prevent recurrence, taping, protective strapping, or an adhesive bandage may be recommended for several weeks after healing is complete. PROGNOSIS   Recovery may take weeks to several months to heal.  Longer recovery is expected if symptoms have been prolonged.  Recovery is usually quicker if the inflammation is due to a direct blow as compared with overuse or sudden strain. RELATED COMPLICATIONS   Healing time will be prolonged if the condition is not correctly treated. The injury must be  given plenty of time to heal.  Symptoms can reoccur if activity is resumed too soon.  Untreated, tendinitis may increase the risk of tendon rupture requiring additional time for recovery and possibly surgery. TREATMENT   The first treatment consists of rest anti-inflammatory medication, and ice to relieve the pain.  Stretching and strengthening exercises after resolution of pain will likely help reduce the risk of recurrence. Referral to a physical therapist or athletic trainer for further evaluation and treatment may be helpful.  A walking boot or cast may be recommended to rest the Achilles tendon. This can help break the cycle of inflammation and microtrauma.  Arch supports (orthotics) may be prescribed or recommended by your caregiver as an adjunct to therapy and rest.  Surgery to remove the inflamed tendon lining or degenerated tendon tissue is rarely necessary and has shown less than predictable results. MEDICATION   Nonsteroidal anti-inflammatory medications, such as aspirin and ibuprofen, may be used for pain and inflammation relief. Do not take within 7 days before surgery. Take these as directed by your caregiver. Contact your caregiver immediately if any bleeding, stomach upset, or signs of allergic reaction occur. Other minor pain relievers, such as acetaminophen, may also be used.  Pain relievers may be prescribed as necessary by your caregiver. Do not take prescription pain medication for longer than 4 to 7 days. Use only as directed and only as much as you need.  Cortisone injections are rarely indicated. Cortisone injections may weaken tendons and predispose to rupture. It is better to give the condition more time to heal than to use them. HEAT AND COLD  Cold is used to relieve pain and reduce inflammation  for acute and chronic Achilles tendinitis. Cold should be applied for 10 to 15 minutes every 2 to 3 hours for inflammation and pain and immediately after any activity that  aggravates your symptoms. Use ice packs or an ice massage.  Heat may be used before performing stretching and strengthening activities prescribed by your caregiver. Use a heat pack or a warm soak. SEEK MEDICAL CARE IF:  Symptoms get worse or do not improve in 2 weeks despite treatment.  New, unexplained symptoms develop. Drugs used in treatment may produce side effects.  EXERCISES:  RANGE OF MOTION (ROM) AND STRETCHING EXERCISES - Achilles Tendinitis  These exercises may help you when beginning to rehabilitate your injury. Your symptoms may resolve with or without further involvement from your physician, physical therapist or athletic trainer. While completing these exercises, remember:   Restoring tissue flexibility helps normal motion to return to the joints. This allows healthier, less painful movement and activity.  An effective stretch should be held for at least 30 seconds.  A stretch should never be painful. You should only feel a gentle lengthening or release in the stretched tissue.  STRETCH  Gastroc, Standing   Place hands on wall.  Extend right / left leg, keeping the front knee somewhat bent.  Slightly point your toes inward on your back foot.  Keeping your right / left heel on the floor and your knee straight, shift your weight toward the wall, not allowing your back to arch.  You should feel a gentle stretch in the right / left calf. Hold this position for 10 seconds. Repeat 3 times. Complete this stretch 2 times per day.  STRETCH  Soleus, Standing   Place hands on wall.  Extend right / left leg, keeping the other knee somewhat bent.  Slightly point your toes inward on your back foot.  Keep your right / left heel on the floor, bend your back knee, and slightly shift your weight over the back leg so that you feel a gentle stretch deep in your back calf.  Hold this position for 10 seconds. Repeat 3 times. Complete this stretch 2 times per day.  STRETCH   Gastrocsoleus, Standing  Note: This exercise can place a lot of stress on your foot and ankle. Please complete this exercise only if specifically instructed by your caregiver.   Place the ball of your right / left foot on a step, keeping your other foot firmly on the same step.  Hold on to the wall or a rail for balance.  Slowly lift your other foot, allowing your body weight to press your heel down over the edge of the step.  You should feel a stretch in your right / left calf.  Hold this position for 10 seconds.  Repeat this exercise with a slight bend in your knee. Repeat 3 times. Complete this stretch 2 times per day.   STRENGTHENING EXERCISES - Achilles Tendinitis These exercises may help you when beginning to rehabilitate your injury. They may resolve your symptoms with or without further involvement from your physician, physical therapist or athletic trainer. While completing these exercises, remember:   Muscles can gain both the endurance and the strength needed for everyday activities through controlled exercises.  Complete these exercises as instructed by your physician, physical therapist or athletic trainer. Progress the resistance and repetitions only as guided.  You may experience muscle soreness or fatigue, but the pain or discomfort you are trying to eliminate should never worsen during these exercises. If  this pain does worsen, stop and make certain you are following the directions exactly. If the pain is still present after adjustments, discontinue the exercise until you can discuss the trouble with your clinician.  STRENGTH - Plantar-flexors   Sit with your right / left leg extended. Holding onto both ends of a rubber exercise band/tubing, loop it around the ball of your foot. Keep a slight tension in the band.  Slowly push your toes away from you, pointing them downward.  Hold this position for 10 seconds. Return slowly, controlling the tension in the  band/tubing. Repeat 3 times. Complete this exercise 2 times per day.   STRENGTH - Plantar-flexors   Stand with your feet shoulder width apart. Steady yourself with a wall or table using as little support as needed.  Keeping your weight evenly spread over the width of your feet, rise up on your toes.*  Hold this position for 10 seconds. Repeat 3 times. Complete this exercise 2 times per day.  *If this is too easy, shift your weight toward your right / left leg until you feel challenged. Ultimately, you may be asked to do this exercise with your right / left foot only.  STRENGTH  Plantar-flexors, Eccentric  Note: This exercise can place a lot of stress on your foot and ankle. Please complete this exercise only if specifically instructed by your caregiver.   Place the balls of your feet on a step. With your hands, use only enough support from a wall or rail to keep your balance.  Keep your knees straight and rise up on your toes.  Slowly shift your weight entirely to your right / left toes and pick up your opposite foot. Gently and with controlled movement, lower your weight through your right / left foot so that your heel drops below the level of the step. You will feel a slight stretch in the back of your calf at the end position.  Use the healthy leg to help rise up onto the balls of both feet, then lower weight only on the right / left leg again. Build up to 15 repetitions. Then progress to 3 consecutive sets of 15 repetitions.*  After completing the above exercise, complete the same exercise with a slight knee bend (about 30 degrees). Again, build up to 15 repetitions. Then progress to 3 consecutive sets of 15 repetitions.* Perform this exercise 2 times per day.  *When you easily complete 3 sets of 15, your physician, physical therapist or athletic trainer may advise you to add resistance by wearing a backpack filled with additional weight.  STRENGTH - Plantar Flexors, Seated   Sit on  a chair that allows your feet to rest flat on the ground. If necessary, sit at the edge of the chair.  Keeping your toes firmly on the ground, lift your right / left heel as far as you can without increasing any discomfort in your ankle. Repeat 3 times. Complete this exercise 2 times a day.

## 2020-06-29 ENCOUNTER — Encounter: Payer: Self-pay | Admitting: Podiatry

## 2020-06-29 NOTE — Progress Notes (Signed)
  Subjective:  Patient ID: [name] [name], male    DOB: 1956/03/13,  MRN: [id number]  Chief Complaint  Patient presents with  . Plantar Fasciitis    follow-up feeling much better, no PT done    64 y.o. male presents with the above complaint. History confirmed with patient.  He was unable to start physical therapy due to his job.  He notes posterior heel pain now as well.  Overall has had some improvement in the plantar heel.  He has been doing the stretching exercises on his own.  Objective:  Physical Exam: warm, good capillary refill, no trophic changes or ulcerative lesions, normal DP and PT pulses and normal sensory exam. Left Foot: Less tenderness in the plantar heel today, he now has pain in the posterior heel at the Achilles insertion.  Gastrocnemius equinus present with a positive Silfverskiold test.   Assessment:   1. Plantar fasciitis, left   2. Achilles tendinitis, left leg   3. Gastrocnemius equinus of left lower extremity      Plan:  Patient was evaluated and treated and all questions answered.   -Educated patient on stretching and icing of the affected limb now for both Achilles tendon-itis and plantar fasciitis -Continue wearing plantar fascial brace  -Given that he is hesitant to receive corticosteroid injection or take NSAIDs secondary to his diabetes which I agree with, we have limited treatment options.  I recommend he continue his plantar fascial brace and his stretching exercises now for both the Achilles tendon and the plantar fascia.  Recommended he obtain Tuli's heel cups to offload the Achilles tendon.  A felt heel lift was placed into a shoe today.  We will reevaluate in 6 weeks. -We also discussed eventual use of shockwave therapy or surgical release of plantar fascial if he does not improve with nonsurgical treatment.   Return in about 6 weeks (around 08/09/2020).

## 2020-07-01 ENCOUNTER — Other Ambulatory Visit: Payer: Self-pay | Admitting: *Deleted

## 2020-07-01 MED ORDER — SIMVASTATIN 40 MG PO TABS: 40.0000 mg | ORAL_TABLET | Freq: Every evening | ORAL | 0 refills | Status: DC

## 2020-07-05 ENCOUNTER — Other Ambulatory Visit: Payer: Self-pay

## 2020-07-05 ENCOUNTER — Encounter: Payer: Self-pay | Admitting: Endocrinology

## 2020-07-05 ENCOUNTER — Ambulatory Visit: Payer: Medicare Other | Admitting: Endocrinology

## 2020-07-05 VITALS — BP 130/80 | HR 71 | Ht 72.0 in | Wt 247.4 lb

## 2020-07-05 DIAGNOSIS — E1151 Type 2 diabetes mellitus with diabetic peripheral angiopathy without gangrene: Secondary | ICD-10-CM

## 2020-07-05 LAB — POCT GLYCOSYLATED HEMOGLOBIN (HGB A1C): Hemoglobin A1C: 6.5 % — AB (ref 4.0–5.6)

## 2020-07-05 MED ORDER — TRULICITY 4.5 MG/0.5ML ~~LOC~~ SOAJ: 4.5000 mg | SUBCUTANEOUS | 3 refills | Status: DC

## 2020-07-05 NOTE — Patient Instructions (Signed)
I have sent a prescription to your pharmacy, to increase the Trulicity. Please continue the same other medications.   check your blood sugar once a day.  vary the time of day when you check, between before the 3 meals, and at bedtime.  also check if you have symptoms of your blood sugar being too high or too low.  please keep a record of the readings and bring it to your next appointment here (or you can bring the meter itself).  You can write it on any piece of paper.  please call us sooner if your blood sugar goes below 70, or if you have a lot of readings over 200. Please come back for a follow-up appointment in 4 months.

## 2020-07-05 NOTE — Progress Notes (Signed)
Subjective:    Patient ID: [name] [name], male    DOB: 1955-09-29, 64 y.o.   MRN: [id number]  HPI Pt returns for f/u of diabetes mellitus:  DM type: 2 Dx'ed: 8182 Complications: CAD, PN and CVA.   Therapy: Trulicity and 4 oral meds.  DKA: never Severe hypoglycemia: never.   Pancreatitis: never.   Other: he has never taken insulin; he works as a Nurse, adult; he did not tolerate colestipol (abd pain).  Interval history: pt states he feels better in general.  He takes meds as rx'ed.  Past Medical History:  Diagnosis Date  . Arthritis   . Cholelithiasis   . Coronary atherosclerosis of native coronary artery    DES RCA 10/05  . Essential hypertension   . Hyperlipidemia   . OA (osteoarthritis)   . Stroke [facility])    Left pons 6/11 - Aggrenox started  . Testicular cancer ([place]) 1978  . Type 2 diabetes mellitus ([place])   . Urolithiasis     Past Surgical History:  Procedure Laterality Date  . Colonoscopy    . CORONARY STENT PLACEMENT  2005  . Testicular tumor resection     Right side    Social History   Socioeconomic History  . Marital status: Married    Spouse name: [name]  . Number of children: 0  . Years of education: Not on file  . Highest education level: Not on file  Occupational History  . Occupation: Truck Geophysicist/field seismologist  Tobacco Use  . Smoking status: Former Smoker    Packs/day: 1.50    Years: 43.00    Pack years: 64.50    Types: Cigarettes    Quit date: 04/21/2010    Years since quitting: 10.2  . Smokeless tobacco: Never Used  Vaping Use  . Vaping Use: Never used  Substance and Sexual Activity  . Alcohol use: Yes    Alcohol/week: 0.0 standard drinks    Comment: "Not much..1 (6 pack) a year"  . Drug use: No  . Sexual activity: Not on file  Other Topics Concern  . Not on file  Social History Narrative  . Not on file   Social Determinants of Health   Financial Resource Strain:   . Difficulty of Paying Living Expenses: Not on file  Food Insecurity:   .  Worried About Charity fundraiser in the Last Year: Not on file  . Ran Out of Food in the Last Year: Not on file  Transportation Needs:   . Lack of Transportation (Medical): Not on file  . Lack of Transportation (Non-Medical): Not on file  Physical Activity:   . Days of Exercise per Week: Not on file  . Minutes of Exercise per Session: Not on file  Stress:   . Feeling of Stress : Not on file  Social Connections:   . Frequency of Communication with Friends and Family: Not on file  . Frequency of Social Gatherings with Friends and Family: Not on file  . Attends Religious Services: Not on file  . Active Member of Clubs or Organizations: Not on file  . Attends Archivist Meetings: Not on file  . Marital Status: Not on file  Intimate Partner Violence:   . Fear of Current or Ex-Partner: Not on file  . Emotionally Abused: Not on file  . Physically Abused: Not on file  . Sexually Abused: Not on file    Current Outpatient Medications on File Prior to Visit  Medication Sig Dispense Refill  .  aspirin EC 81 MG tablet Take 1 tablet (81 mg total) by mouth daily. 90 tablet 3  . bromocriptine (PARLODEL) 2.5 MG tablet TAKE ONE-HALF (1/2) TABLET AT BEDTIME 45 tablet 3  . fish oil-omega-3 fatty acids 1000 MG capsule Take 1 capsule by mouth 2 (two) times daily.     Marland Kitchen glucose blood (ONETOUCH VERIO) test strip 1 each by Other route daily. And lancets 1/day 100 each 3  . JARDIANCE 25 MG TABS tablet TAKE 1 TABLET DAILY 90 tablet 3  . meloxicam (MOBIC) 15 MG tablet Take 1 tablet (15 mg total) by mouth daily. 30 tablet 0  . metFORMIN (GLUCOPHAGE-XR) 500 MG 24 hr tablet TAKE 2 TABLETS DAILY 180 tablet 3  . Multiple Vitamin (MULTIVITAMIN) tablet Take 1 tablet by mouth daily.    . nitroGLYCERIN (NITROSTAT) 0.4 MG SL tablet DISSOLVE 1 TABLET UNDER THE TONGUE EVERY 5 MINUTES FOR 3 DOSES AS NEEDED. MAY REPEAT EVERY 5 MINUTES FOR UP TO 3 DOSES. 25 tablet 3  . pioglitazone (ACTOS) 15 MG tablet TAKE 1  TABLET DAILY 90 tablet 3  . simvastatin (ZOCOR) 40 MG tablet Take 1 tablet (40 mg total) by mouth every evening. 90 tablet 0   No current facility-administered medications on file prior to visit.    No Known Allergies  Family History  Problem Relation Age of Onset  . Heart disease Father   . Diabetes Father   . Heart attack Father   . Colon cancer Neg Hx   . Esophageal cancer Neg Hx   . Rectal cancer Neg Hx   . Stomach cancer Neg Hx   . Colon polyps Neg Hx     BP 130/80   Pulse 71   Ht 6' (1.829 m)   Wt 247 lb 6 oz (112.2 kg)   SpO2 96%   BMI 33.55 kg/m    Review of Systems     Objective:   Physical Exam VITAL SIGNS:  See vs page GENERAL: no distress Pulses: dorsalis pedis intact bilat.   MSK: no deformity of the feet CV: no leg edema Skin:  no ulcer on the feet.  normal color and temp on the feet. Neuro: sensation is intact to touch on the feet  A1c=6.5%     Assessment & Plan:  Type 2 DM, with CAD: uncontrolled (goal A1c=6.0%) Occupational status: he needs to control DM without insulin  Patient Instructions  I have sent a prescription to your pharmacy, to increase the Trulicity. Please continue the same other medications.   check your blood sugar once a day.  vary the time of day when you check, between before the 3 meals, and at bedtime.  also check if you have symptoms of your blood sugar being too high or too low.  please keep a record of the readings and bring it to your next appointment here (or you can bring the meter itself).  You can write it on any piece of paper.  please call us sooner if your blood sugar goes below 70, or if you have a lot of readings over 200. Please come back for a follow-up appointment in 4 months.

## 2020-07-24 ENCOUNTER — Telehealth: Payer: Self-pay

## 2020-07-24 NOTE — Telephone Encounter (Signed)
Left message for patient to clarify if new Jardiance prescription needs to send to [facility] otherwise he should have refill with [facility]. Mychart message sent as well.

## 2020-07-29 MED ORDER — EMPAGLIFLOZIN 25 MG PO TABS
25.0000 mg | ORAL_TABLET | Freq: Every day | ORAL | 0 refills | Status: DC
Start: 2020-07-29 — End: 2020-08-16

## 2020-08-09 ENCOUNTER — Other Ambulatory Visit: Payer: Self-pay

## 2020-08-09 ENCOUNTER — Ambulatory Visit (INDEPENDENT_AMBULATORY_CARE_PROVIDER_SITE_OTHER): Payer: Medicare Other | Admitting: Podiatry

## 2020-08-09 DIAGNOSIS — M722 Plantar fascial fibromatosis: Secondary | ICD-10-CM

## 2020-08-09 DIAGNOSIS — M7662 Achilles tendinitis, left leg: Secondary | ICD-10-CM

## 2020-08-09 NOTE — Patient Instructions (Signed)
Look for Voltaren gel at the pharmacy over the counter or online (also known as diclofenac 1% gel). Apply to the painful areas 3-4x daily with the supplied dosing card. Allow to dry for 10 minutes before going into socks/shoes   Plantar Fasciitis (Heel Spur Syndrome) with Rehab The plantar fascia is a fibrous, ligament-like, soft-tissue structure that spans the bottom of the foot. Plantar fasciitis is a condition that causes pain in the foot due to inflammation of the tissue. SYMPTOMS  Pain and tenderness on the underneath side of the foot. Pain that worsens with standing or walking. CAUSES  Plantar fasciitis is caused by irritation and injury to the plantar fascia on the underneath side of the foot. Common mechanisms of injury include: Direct trauma to bottom of the foot. Damage to a small nerve that runs under the foot where the main fascia attaches to the heel bone. Stress placed on the plantar fascia due to bone spurs. RISK INCREASES WITH:  Activities that place stress on the plantar fascia (running, jumping, pivoting, or cutting). Poor strength and flexibility. Improperly fitted shoes. Tight calf muscles. Flat feet. Failure to warm-up properly before activity. Obesity. PREVENTION Warm up and stretch properly before activity. Allow for adequate recovery between workouts. Maintain physical fitness: Strength, flexibility, and endurance. Cardiovascular fitness. Maintain a health body weight. Avoid stress on the plantar fascia. Wear properly fitted shoes, including arch supports for individuals who have flat feet.  PROGNOSIS  If treated properly, then the symptoms of plantar fasciitis usually resolve without surgery. However, occasionally surgery is necessary.  RELATED COMPLICATIONS  Recurrent symptoms that may result in a chronic condition. Problems of the lower back that are caused by compensating for the injury, such as limping. Pain or weakness of the foot during push-off  following surgery. Chronic inflammation, scarring, and partial or complete fascia tear, occurring more often from repeated injections.  TREATMENT  Treatment initially involves the use of ice and medication to help reduce pain and inflammation. The use of strengthening and stretching exercises may help reduce pain with activity, especially stretches of the Achilles tendon. These exercises may be performed at home or with a therapist. Your caregiver may recommend that you use heel cups of arch supports to help reduce stress on the plantar fascia. Occasionally, corticosteroid injections are given to reduce inflammation. If symptoms persist for greater than 6 months despite non-surgical (conservative), then surgery may be recommended.   MEDICATION  If pain medication is necessary, then nonsteroidal anti-inflammatory medications, such as aspirin and ibuprofen, or other minor pain relievers, such as acetaminophen, are often recommended. Do not take pain medication within 7 days before surgery. Prescription pain relievers may be given if deemed necessary by your caregiver. Use only as directed and only as much as you need. Corticosteroid injections may be given by your caregiver. These injections should be reserved for the most serious cases, because they may only be given a certain number of times.  HEAT AND COLD Cold treatment (icing) relieves pain and reduces inflammation. Cold treatment should be applied for 10 to 15 minutes every 2 to 3 hours for inflammation and pain and immediately after any activity that aggravates your symptoms. Use ice packs or massage the area with a piece of ice (ice massage). Heat treatment may be used prior to performing the stretching and strengthening activities prescribed by your caregiver, physical therapist, or athletic trainer. Use a heat pack or soak the injury in warm water.  SEEK IMMEDIATE MEDICAL CARE IF: Treatment seems   to offer no benefit, or the condition  worsens. Any medications produce adverse side effects.  EXERCISES- RANGE OF MOTION (ROM) AND STRETCHING EXERCISES - Plantar Fasciitis (Heel Spur Syndrome) These exercises may help you when beginning to rehabilitate your injury. Your symptoms may resolve with or without further involvement from your physician, physical therapist or athletic trainer. While completing these exercises, remember:  Restoring tissue flexibility helps normal motion to return to the joints. This allows healthier, less painful movement and activity. An effective stretch should be held for at least 30 seconds. A stretch should never be painful. You should only feel a gentle lengthening or release in the stretched tissue.  RANGE OF MOTION - Toe Extension, Flexion Sit with your right / left leg crossed over your opposite knee. Grasp your toes and gently pull them back toward the top of your foot. You should feel a stretch on the bottom of your toes and/or foot. Hold this stretch for 10 seconds. Now, gently pull your toes toward the bottom of your foot. You should feel a stretch on the top of your toes and or foot. Hold this stretch for 10 seconds. Repeat  times. Complete this stretch 3 times per day.   RANGE OF MOTION - Ankle Dorsiflexion, Active Assisted Remove shoes and sit on a chair that is preferably not on a carpeted surface. Place right / left foot under knee. Extend your opposite leg for support. Keeping your heel down, slide your right / left foot back toward the chair until you feel a stretch at your ankle or calf. If you do not feel a stretch, slide your bottom forward to the edge of the chair, while still keeping your heel down. Hold this stretch for 10 seconds. Repeat 3 times. Complete this stretch 2 times per day.   STRETCH  Gastroc, Standing Place hands on wall. Extend right / left leg, keeping the front knee somewhat bent. Slightly point your toes inward on your back foot. Keeping your right / left  heel on the floor and your knee straight, shift your weight toward the wall, not allowing your back to arch. You should feel a gentle stretch in the right / left calf. Hold this position for 10 seconds. Repeat 3 times. Complete this stretch 2 times per day.  STRETCH  Soleus, Standing Place hands on wall. Extend right / left leg, keeping the other knee somewhat bent. Slightly point your toes inward on your back foot. Keep your right / left heel on the floor, bend your back knee, and slightly shift your weight over the back leg so that you feel a gentle stretch deep in your back calf. Hold this position for 10 seconds. Repeat 3 times. Complete this stretch 2 times per day.  STRETCH  Gastrocsoleus, Standing  Note: This exercise can place a lot of stress on your foot and ankle. Please complete this exercise only if specifically instructed by your caregiver.  Place the ball of your right / left foot on a step, keeping your other foot firmly on the same step. Hold on to the wall or a rail for balance. Slowly lift your other foot, allowing your body weight to press your heel down over the edge of the step. You should feel a stretch in your right / left calf. Hold this position for 10 seconds. Repeat this exercise with a slight bend in your right / left knee. Repeat 3 times. Complete this stretch 2 times per day.   STRENGTHENING EXERCISES - Plantar   Fasciitis (Heel Spur Syndrome)  These exercises may help you when beginning to rehabilitate your injury. They may resolve your symptoms with or without further involvement from your physician, physical therapist or athletic trainer. While completing these exercises, remember:  Muscles can gain both the endurance and the strength needed for everyday activities through controlled exercises. Complete these exercises as instructed by your physician, physical therapist or athletic trainer. Progress the resistance and repetitions only as guided.  STRENGTH -  Towel Curls Sit in a chair positioned on a non-carpeted surface. Place your foot on a towel, keeping your heel on the floor. Pull the towel toward your heel by only curling your toes. Keep your heel on the floor. Repeat 3 times. Complete this exercise 2 times per day.  STRENGTH - Ankle Inversion Secure one end of a rubber exercise band/tubing to a fixed object (table, pole). Loop the other end around your foot just before your toes. Place your fists between your knees. This will focus your strengthening at your ankle. Slowly, pull your big toe up and in, making sure the band/tubing is positioned to resist the entire motion. Hold this position for 10 seconds. Have your muscles resist the band/tubing as it slowly pulls your foot back to the starting position. Repeat 3 times. Complete this exercises 2 times per day.  Document Released: 09/14/2005 Document Revised: 12/07/2011 Document Reviewed: 12/27/2008 Exit[employer] Patient Information 2014 [employer].  

## 2020-08-11 ENCOUNTER — Encounter: Payer: Self-pay | Admitting: Podiatry

## 2020-08-11 NOTE — Progress Notes (Signed)
  Subjective:  Patient ID: [name] [name], male    DOB: [birth year],  MRN: [id number]  Chief Complaint  Patient presents with  . Plantar Fasciitis    Pt states greatly resolved, still experiencing some pain when standing on concrete 7-8 hours, brace is helpful, some residual heel pain.    64 y.o. male returns with the above complaint. History confirmed with patient.  Overall improved. Gel pad has been helpful. PF brace is helpful.  Objective:  Physical Exam: warm, good capillary refill, no trophic changes or ulcerative lesions, normal DP and PT pulses and normal sensory exam. Left Foot: Mild pain plantar heel, none posterior now.  Gastrocnemius equinus present with a positive Silfverskiold test.   Assessment:   No diagnosis found.   Plan:  Patient was evaluated and treated and all questions answered.   -Continue stretching and icing -Recommended voltaren gel usage -Re-evaluate in 2 months   Return in about 2 months (around 10/09/2020).

## 2020-08-14 ENCOUNTER — Other Ambulatory Visit: Payer: Self-pay | Admitting: *Deleted

## 2020-08-14 MED ORDER — BROMOCRIPTINE MESYLATE 2.5 MG PO TABS: ORAL_TABLET | ORAL | 1 refills | Status: DC

## 2020-08-14 MED ORDER — TRULICITY 4.5 MG/0.5ML ~~LOC~~ SOAJ: 4.5000 mg | SUBCUTANEOUS | 3 refills | Status: DC

## 2020-08-14 MED ORDER — PIOGLITAZONE HCL 15 MG PO TABS
15.0000 mg | ORAL_TABLET | Freq: Every day | ORAL | 1 refills | Status: DC
Start: 2020-08-14 — End: 2020-08-16

## 2020-08-14 MED ORDER — METFORMIN HCL ER 500 MG PO TB24: 1000.0000 mg | ORAL_TABLET | Freq: Every day | ORAL | 1 refills | Status: DC

## 2020-08-16 ENCOUNTER — Telehealth: Payer: Self-pay | Admitting: Endocrinology

## 2020-08-16 ENCOUNTER — Other Ambulatory Visit: Payer: Self-pay | Admitting: *Deleted

## 2020-08-16 DIAGNOSIS — E1151 Type 2 diabetes mellitus with diabetic peripheral angiopathy without gangrene: Secondary | ICD-10-CM

## 2020-08-16 MED ORDER — METFORMIN HCL ER 500 MG PO TB24: 1000.0000 mg | ORAL_TABLET | Freq: Every day | ORAL | 1 refills | Status: DC

## 2020-08-16 MED ORDER — EMPAGLIFLOZIN 25 MG PO TABS: 25.0000 mg | ORAL_TABLET | Freq: Every day | ORAL | 0 refills | Status: DC

## 2020-08-16 MED ORDER — TRULICITY 4.5 MG/0.5ML ~~LOC~~ SOAJ: 4.5000 mg | SUBCUTANEOUS | 3 refills | Status: DC

## 2020-08-16 MED ORDER — PIOGLITAZONE HCL 15 MG PO TABS: 15.0000 mg | ORAL_TABLET | Freq: Every day | ORAL | 1 refills | Status: DC

## 2020-08-16 NOTE — Telephone Encounter (Signed)
Refilled Rx and sent to [facility]

## 2020-08-16 NOTE — Telephone Encounter (Signed)
Patient's wife [name] called re: [name] sent a message through MyChart that ALL RX's be sent to  [place], [place], [address] Phone:  [phone]  Fax:  [phone]     [name] requests that the RX's for Dulaglutide (TRULICITY) 4.5 XN/2.3FT SOPN  AND pioglitazone (ACTOS) 15 MG tablet  be resent as a new RX to the [facility] listed above as was requested on the MyChart message.

## 2020-08-26 ENCOUNTER — Other Ambulatory Visit: Payer: Self-pay

## 2020-08-26 ENCOUNTER — Telehealth: Payer: Self-pay | Admitting: Endocrinology

## 2020-08-26 MED ORDER — BROMOCRIPTINE MESYLATE 2.5 MG PO TABS
ORAL_TABLET | ORAL | 1 refills | Status: DC
Start: 2020-08-26 — End: 2021-02-16

## 2020-08-26 NOTE — Telephone Encounter (Signed)
Patient called to request that the Bromocriptine  RX be sent to [facility] .  Per patient [facility] the preferred pharmacy for his insurance and for him.

## 2020-08-26 NOTE — Telephone Encounter (Signed)
RX sent to pharmacy  

## 2020-09-01 ENCOUNTER — Other Ambulatory Visit: Payer: Self-pay | Admitting: Cardiology

## 2020-10-03 NOTE — Progress Notes (Signed)
Cardiology Office Note  Date: 10/04/2020   ID: [name] [name], DOB Feb 16, 1956, MRN [id number]  PCP:  [name] Lathe, PA-C  Cardiologist:  [name] Lesches, MD Electrophysiologist:  None   Chief Complaint: Cardiology follow-up.  Needs stress test for DOT certificate  History of Present Illness: [name] [name] is a 65 y.o. male with a history of CAD status post DES to RCA 2005, HTN, HLD, CVA, DM 2, arthritis, testicular cancer.  Last encounter with Dr. Domenic [name] 01/19/2020. When he saw Dr. Domenic [name] he had previously had COVID-19 earlier in the year which resulted in an extended hospital stay. He reported no major change in symptoms from a cardiac perspective from previous visit. Had no obvious anginal symptoms. Had a previous low risk stress test in 2019. He was continuing aspirin, Zocor, and as needed nitroglycerin. Previous LDL was 67. He was continuing to follow with endocrinology for his diabetes. Most recent hemoglobin A1c on 07/05/2020 was 6.5%.   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  Today he denies any recent acute illnesses or hospitalizations.  He had Covid earlier in 2021 which he feels he has recovered from.  However he states he has occasional brief episodes of shortness of breath not necessarily associated with exertion.  States his diabetes is under good control with recent hemoglobin A1c of 6.2% per his statement.  He sees Dr. Loanne [name] endocrinology in [place].  He denies any anginal symptoms, palpitations or arrhythmias, orthostatic symptoms, PND, orthopnea, bleeding issues.  Denies any claudication-like symptoms, DVT or PE-like symptoms, or lower extremity edema.  He suffers from chronic plantar fasciitis in both feet.  He wears orthopedic device for arch support.  Blood pressure is elevated today.  Blood pressures on previous 2 visits in 11/18/2019 in the epic  system were within normal limits.  Currently not on any antihypertensive medications.   Past Medical History:  Diagnosis Date  . Arthritis   . Cholelithiasis   . Coronary atherosclerosis of native coronary artery    DES RCA 10/05  . Essential hypertension   . Hyperlipidemia   . OA (osteoarthritis)   . Stroke [facility])    Left pons 6/11 - Aggrenox started  . Testicular cancer ([place]) 1978  . Type 2 diabetes mellitus ([place])   . Urolithiasis     Past Surgical History:  Procedure Laterality Date  . Colonoscopy    . CORONARY STENT PLACEMENT  2005  . Testicular tumor resection     Right side    Current Outpatient Medications  Medication Sig Dispense Refill  . aspirin EC 81 MG tablet Take 1 tablet (81 mg total) by mouth daily. 90 tablet 3  . bromocriptine (PARLODEL) 2.5 MG tablet Take 1/2 tablet daily 45 tablet 1  . Dulaglutide (TRULICITY) 4.5 0000000 SOPN Inject 4.5 mg into the skin once a week. 6 mL 3  . empagliflozin (JARDIANCE) 25 MG TABS tablet Take 1 tablet (25 mg total) by mouth daily. 90 tablet 0  . fish oil-omega-3 fatty acids 1000 MG capsule Take 1 capsule by mouth 2 (two) times daily.    Marland Kitchen glucose blood (ONETOUCH VERIO) test strip 1 each by Other route daily. And lancets 1/day 100 each 3  . metFORMIN (GLUCOPHAGE-XR) 500 MG 24 hr tablet Take 2 tablets (1,000 mg total) by mouth daily. 180 tablet 1  . Multiple Vitamin (MULTIVITAMIN) tablet Take 1  tablet by mouth daily.    . nitroGLYCERIN (NITROSTAT) 0.4 MG SL tablet DISSOLVE 1 TABLET UNDER THE TONGUE EVERY 5 MINUTES FOR 3 DOSES AS NEEDED. MAY REPEAT EVERY 5 MINUTES FOR UP TO 3 DOSES. 25 tablet 3  . pioglitazone (ACTOS) 15 MG tablet Take 1 tablet (15 mg total) by mouth daily. 90 tablet 1  . simvastatin (ZOCOR) 40 MG tablet TAKE 1 TABLET BY MOUTH IN  THE EVENING 90 tablet 0   No current facility-administered medications for this visit.   Allergies:  Patient has no known allergies.   Social History: The patient  reports that he  quit smoking about 10 years ago. His smoking use included cigarettes. He has a 64.50 pack-year smoking history. He has never used smokeless tobacco. He reports current alcohol use. He reports that he does not use drugs.   Family History: The patient's family history includes Diabetes in his father; Heart attack in his father; Heart disease in his father.   ROS:  Please see the history of present illness. Otherwise, complete review of systems is positive for none.  All other systems are reviewed and negative.   Physical Exam: VS:  BP (!) 142/78   Pulse 64   Ht 6' (1.829 m)   Wt 252 lb (114.3 kg)   SpO2 99%   BMI 34.18 kg/m , BMI Body mass index is 34.18 kg/m.  Wt Readings from Last 3 Encounters:  10/04/20 252 lb (114.3 kg)  07/05/20 247 lb 6 oz (112.2 kg)  01/26/20 247 lb (112 kg)    General: Patient appears comfortable at rest. Neck: Supple, no elevated JVP or carotid bruits, no thyromegaly. Lungs: Clear to auscultation, nonlabored breathing at rest. Cardiac: Regular rate and rhythm, no S3 or significant systolic murmur, no pericardial rub. Extremities: No pitting edema, distal pulses 2+. Skin: Warm and dry. Musculoskeletal: No kyphosis. Neuropsychiatric: Alert and oriented x3, affect grossly appropriate.  ECG:  EKG January 19, 2020 normal sinus rhythm rate of 80, nonspecific T wave abnormality  Recent Labwork: 01/26/2020: TSH 1.27     Component Value Date/Time   CHOL 127 04/01/2018 1408   TRIG 241.0 (H) 04/01/2018 1408   TRIG 96 07/09/2006 1049   HDL 50.90 04/01/2018 1408   CHOLHDL 3 04/01/2018 1408   VLDL 48.2 (H) 04/01/2018 1408   LDLCALC 56 11/02/2014 1424   LDLDIRECT 57.0 04/01/2018 1408    Other Studies Reviewed Today:   Lexiscan Myoview 09/19/2018:  No diagnostic ST segment changes to indicate ischemia.  No significant myocardial perfusion defects to indicate scar or ischemia.  This is a low risk study.  Nuclear stress EF: 75%.   Assessment and  Plan:  1. CAD in native artery   2. Mixed hyperlipidemia    1. CAD in native artery Denies any recent anginal or exertional symptoms.  Continue aspirin 81 mg daily, nitroglycerin sublingual.  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  Patient has an upcoming DOT exam with his primary care provider.  2. Mixed hyperlipidemia Continue simvastatin 40 mg daily.  Last available lipid panel May 05, 2019: Triglycerides 130, total cholesterol 160, HDL 67, LDL 67   Medication Adjustments/Labs and Tests Ordered: Current medicines are reviewed at length with the patient today.  Concerns regarding medicines are outlined above.   Disposition: Follow-up with Dr. Diona Browner or APP 1 week after stress test.  Needs blood pressure recheck and follow-up on stress test  Signed, Rennis Harding, NP 10/04/2020 1:55  PM    [facility] at [place], [place], [place] [address] Phone: [phone]; Fax: [phone]

## 2020-10-04 ENCOUNTER — Ambulatory Visit (INDEPENDENT_AMBULATORY_CARE_PROVIDER_SITE_OTHER): Payer: Medicare Other | Admitting: Family Medicine

## 2020-10-04 ENCOUNTER — Encounter: Payer: Self-pay | Admitting: Family Medicine

## 2020-10-04 VITALS — BP 142/78 | HR 64 | Ht 72.0 in | Wt 252.0 lb

## 2020-10-04 DIAGNOSIS — I2510 Atherosclerotic heart disease of native coronary artery without angina pectoris: Secondary | ICD-10-CM

## 2020-10-04 DIAGNOSIS — E782 Mixed hyperlipidemia: Secondary | ICD-10-CM

## 2020-10-04 NOTE — Patient Instructions (Signed)
Medication Instructions:  Your physician recommends that you continue on your current medications as directed. Please refer to the Current Medication list given to you today.  *If you need a refill on your cardiac medications before your next appointment, please call your pharmacy*   Lab Work: None todaty If you have labs (blood work) drawn today and your tests are completely normal, you will receive your results only by: Marland Kitchen MyChart Message (if you have MyChart) OR . A paper copy in the mail If you have any lab test that is abnormal or we need to change your treatment, we will call you to review the results.   Testing/Procedures: Your physician has requested that you have a lexiscan myoview. For further information please visit [email or web address]. Please follow instruction sheet, as given.     Follow-Up: At [facility], you and your health needs are our priority.  As part of our continuing mission to provide you with exceptional heart care, we have created designated Provider Care Teams.  These Care Teams include your primary Cardiologist (physician) and Advanced Practice Providers (APPs -  Physician Assistants and Nurse Practitioners) who all work together to provide you with the care you need, when you need it.  We recommend signing up for the patient portal called "MyChart".  Sign up information is provided on this After Visit Summary.  MyChart is used to connect with patients for Virtual Visits (Telemedicine).  Patients are able to view lab/test results, encounter notes, upcoming appointments, etc.  Non-urgent messages can be sent to your provider as well.   To learn more about what you can do with MyChart, go to [email or web address].    Your next appointment:   1 week(s)  The format for your next appointment:   In Person  Provider:   Katina Dung, NP   Other Instructions None     Thank you for choosing [facility] !

## 2020-10-18 ENCOUNTER — Ambulatory Visit (HOSPITAL_COMMUNITY)
Admission: RE | Admit: 2020-10-18 | Discharge: 2020-10-18 | Disposition: A | Discharge status: Home / Self Care | Payer: Medicare Other | Source: Ambulatory Visit | Attending: Family Medicine | Admitting: Family Medicine

## 2020-10-18 ENCOUNTER — Other Ambulatory Visit: Payer: Self-pay

## 2020-10-18 ENCOUNTER — Encounter (HOSPITAL_COMMUNITY): Payer: Self-pay

## 2020-10-18 ENCOUNTER — Encounter (HOSPITAL_COMMUNITY)
Admission: RE | Admit: 2020-10-18 | Discharge: 2020-10-18 | Disposition: A | Discharge status: Home / Self Care | Payer: Medicare Other | Source: Ambulatory Visit | Attending: Family Medicine | Admitting: Family Medicine

## 2020-10-18 DIAGNOSIS — I2510 Atherosclerotic heart disease of native coronary artery without angina pectoris: Secondary | ICD-10-CM | POA: Insufficient documentation

## 2020-10-18 LAB — NM MYOCAR MULTI W/SPECT W/WALL MOTION / EF
LV dias vol: 73 mL (ref 62–150)
LV sys vol: 18 mL
Peak HR: 90 {beats}/min
RATE: 0.35
Rest HR: 66 {beats}/min
SDS: 0
SRS: 1
SSS: 1
TID: 1.32

## 2020-10-18 MED ORDER — SODIUM CHLORIDE FLUSH 0.9 % IV SOLN
INTRAVENOUS | Status: AC
  Administered 2020-10-18: 10 mL via INTRAVENOUS
  Filled 2020-10-18: qty 10

## 2020-10-18 MED ORDER — TECHNETIUM TC 99M TETROFOSMIN IV KIT
10.0000 | PACK | Freq: Once | INTRAVENOUS | Status: AC | PRN
  Administered 2020-10-18: 10.8 via INTRAVENOUS

## 2020-10-18 MED ORDER — REGADENOSON 0.4 MG/5ML IV SOLN
INTRAVENOUS | Status: AC
  Administered 2020-10-18: 0.4 mg via INTRAVENOUS
  Filled 2020-10-18: qty 5

## 2020-10-18 MED ORDER — TECHNETIUM TC 99M TETROFOSMIN IV KIT
30.0000 | PACK | Freq: Once | INTRAVENOUS | Status: AC | PRN
  Administered 2020-10-18: 33 via INTRAVENOUS

## 2020-10-21 ENCOUNTER — Telehealth: Payer: Self-pay | Admitting: *Deleted

## 2020-10-21 NOTE — Telephone Encounter (Signed)
Laurine Blazer, LPN  10/22/5807 9:83 AM EST Back to Top     Notified, copy to pcp.

## 2020-10-21 NOTE — Telephone Encounter (Signed)
-----   Message from Erma Heritage, [place] sent at 10/19/2020  8:43 AM EST ----- Ordered by Katina Dung, NP - Please let the patient know his stress test showed no evidence of blockages and pumping function of the heart was normal. Overall a low-risk study. 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

## 2020-10-24 NOTE — Progress Notes (Unsigned)
Cardiology Office Note  Date: 10/25/2020   ID: [name] [name], DOB Dec 17, 1955, MRN [id number]  PCP:  [name] Leriche, PA-C  Cardiologist:  [name] Dell, MD Electrophysiologist:  None   Chief Complaint: Cardiology follow-up.  Needs stress test for DOT certificate  History of Present Illness: [name] [name] is a 65 y.o. male with a history of CAD status post DES to RCA 2005, HTN, HLD, CVA, DM 2, arthritis, testicular cancer.  Last encounter with Dr. Diona [name] 01/19/2020. When he saw Dr. Diona [name] he had previously had COVID-19 earlier in the year which resulted in an extended hospital stay. He reported no major change in symptoms from a cardiac perspective from previous visit. Had no obvious anginal symptoms. Had a previous low risk stress test in 2019. He was continuing aspirin, Zocor, and as needed nitroglycerin. Previous LDL was 67. He was continuing to follow with endocrinology for his diabetes. Most recent hemoglobin A1c on 07/05/2020 was 6.5%.   He is here for 43-month follow-up and needs a follow-up stress test to satisfy DOT certificate renewal.  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  Today he denies any recent acute illnesses or hospitalizations.  He had Covid earlier in 2021 which he feels he has recovered from.  However he states he has occasional brief episodes of shortness of breath not necessarily associated with exertion.  States his diabetes is under good control with recent hemoglobin A1c of 6.2% per his statement.  He sees [name] [name] endocrinology in [place].  He denies any anginal symptoms, palpitations or arrhythmias, orthostatic symptoms, PND, orthopnea, bleeding issues.  Denies any claudication-like symptoms, DVT or PE-like symptoms, or lower extremity edema.  He suffers from chronic plantar fasciitis in both feet.  He wears orthopedic device for arch support.  Blood pressure is elevated today.  Blood pressures on previous 2 visits in 11/18/2019 in the epic  system were within normal limits.  Currently not on any antihypertensive medications.  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  Stress test was negative for ischemia and considered low risk.  Blood pressures well controlled today at 130/74.  He denies any anginal or exertional symptoms, palpitations or arrhythmias, orthostatic symptoms, stroke or TIA-like symptoms, palpitations or arrhythmias, PND, orthopnea, or bleeding.  Denies any claudication-like symptoms, DVT or PE-like symptoms, or lower extremity edema.   Past Medical History:  Diagnosis Date  . Arthritis   . Cholelithiasis   . Coronary atherosclerosis of native coronary artery    DES RCA 10/05  . Essential hypertension   . Hyperlipidemia   . OA (osteoarthritis)   . Stroke [name] [name])    Left pons 6/11 - Aggrenox started  . Testicular cancer (HCC) 1978  . Type 2 diabetes mellitus (HCC)   . Urolithiasis     Past Surgical History:  Procedure Laterality Date  . Colonoscopy    . CORONARY STENT PLACEMENT  2005  . Testicular tumor resection     Right side    Current Outpatient Medications  Medication Sig Dispense Refill  . aspirin EC 81 MG tablet Take 1 tablet (81 mg total) by mouth daily. 90 tablet 3  . bromocriptine (PARLODEL) 2.5 MG tablet Take 1/2 tablet daily 45 tablet 1  . Dulaglutide (TRULICITY) 4.5 MG/0.5ML SOPN Inject 4.5 mg into the skin once a week. 6 mL 3  . empagliflozin (JARDIANCE) 25 MG TABS  tablet Take 1 tablet (25 mg total) by mouth daily. 90 tablet 0  . fish oil-omega-3 fatty acids 1000 MG capsule Take 1 capsule by mouth 2 (two) times daily.    Marland Kitchen glucose blood (ONETOUCH VERIO) test strip 1 each by Other route daily. And lancets 1/day 100 each 3  . metFORMIN (GLUCOPHAGE-XR) 500 MG 24 hr tablet Take 2 tablets (1,000 mg total) by mouth daily. 180 tablet 1  . Multiple Vitamin (MULTIVITAMIN) tablet Take 1  tablet by mouth daily.    . nitroGLYCERIN (NITROSTAT) 0.4 MG SL tablet DISSOLVE 1 TABLET UNDER THE TONGUE EVERY 5 MINUTES FOR 3 DOSES AS NEEDED. MAY REPEAT EVERY 5 MINUTES FOR UP TO 3 DOSES. 25 tablet 3  . pioglitazone (ACTOS) 15 MG tablet Take 1 tablet (15 mg total) by mouth daily. 90 tablet 1  . simvastatin (ZOCOR) 40 MG tablet TAKE 1 TABLET BY MOUTH IN  THE EVENING 90 tablet 0   No current facility-administered medications for this visit.   Allergies:  Patient has no known allergies.   Social History: The patient  reports that he quit smoking about 10 years ago. His smoking use included cigarettes. He has a 64.50 pack-year smoking history. He has never used smokeless tobacco. He reports current alcohol use. He reports that he does not use drugs.   Family History: The patient's family history includes Diabetes in his father; Heart attack in his father; Heart disease in his father.   ROS:  Please see the history of present illness. Otherwise, complete review of systems is positive for none.  [name] other systems are reviewed and negative.   Physical Exam: VS:  BP 130/74   Pulse 74   Ht 6' (1.829 m)   Wt 249 lb 9.6 oz (113.2 kg)   SpO2 99%   BMI 33.85 kg/m , BMI Body mass index is 33.85 kg/m.  Wt Readings from Last 3 Encounters:  10/25/20 249 lb 9.6 oz (113.2 kg)  10/04/20 252 lb (114.3 kg)  07/05/20 247 lb 6 oz (112.2 kg)    General: Patient appears comfortable at rest. Neck: Supple, no elevated JVP or carotid bruits, no thyromegaly. Lungs: Clear to auscultation, nonlabored breathing at rest. Cardiac: Regular rate and rhythm, no S3 or significant systolic murmur, no pericardial rub. Extremities: No pitting edema, distal pulses 2+. Skin: Warm and dry. Musculoskeletal: No kyphosis. Neuropsychiatric: Alert and oriented x3, affect grossly appropriate.  ECG:  EKG January 19, 2020 normal sinus rhythm rate of 80, nonspecific T wave abnormality  Recent Labwork: 01/26/2020: TSH 1.27      Component Value Date/Time   CHOL 127 04/01/2018 1408   TRIG 241.0 (H) 04/01/2018 1408   TRIG 96 07/09/2006 1049   HDL 50.90 04/01/2018 1408   CHOLHDL 3 04/01/2018 1408   VLDL 48.2 (H) 04/01/2018 1408   LDLCALC 56 11/02/2014 1424   LDLDIRECT 57.0 04/01/2018 1408    Other Studies Reviewed Today:  Lexiscan Myoview 10/18/2020 Study Result  Narrative & Impression   No diagnostic ST segment changes to indicate ischemia.  No significant myocardial perfusion defects to indicate scar or ischemia.  This is a low risk study.  Nuclear stress EF: 76%.      Lexiscan Myoview 09/19/2018:  No diagnostic ST segment changes to indicate ischemia.  No significant myocardial perfusion defects to indicate scar or ischemia.  This is a low risk study.  Nuclear stress EF: 75%.   Assessment and Plan:   1. CAD in native artery Recent nuclear stress  test was negative for ischemia.  Patient is cleared to operate a commercial vehicle from a cardiac standpoint. Denies any recent anginal or exertional symptoms.  Continue aspirin 81 mg daily, nitroglycerin sublingual.   2. Mixed hyperlipidemia Continue simvastatin 40 mg daily.  Last available lipid panel May 05, 2019: Triglycerides 130, total cholesterol 160, HDL 67, LDL 67  3.  Essential hypertension Blood pressure well controlled.  BP today 130/74.  Medication Adjustments/Labs and Tests Ordered: Current medicines are reviewed at length with the patient today.  Concerns regarding medicines are outlined above.   Disposition: Follow-up with Dr. [name] or APP 1 year   signed, Levell July, NP 10/25/2020 8:21 AM    [facility] [place], [place], [place] [address] Phone: [phone]; Fax: [phone]

## 2020-10-25 ENCOUNTER — Ambulatory Visit (INDEPENDENT_AMBULATORY_CARE_PROVIDER_SITE_OTHER): Payer: Medicare Other | Admitting: Family Medicine

## 2020-10-25 ENCOUNTER — Encounter: Payer: Self-pay | Admitting: Family Medicine

## 2020-10-25 ENCOUNTER — Ambulatory Visit: Payer: Medicare Other | Admitting: Podiatry

## 2020-10-25 ENCOUNTER — Other Ambulatory Visit: Payer: Self-pay

## 2020-10-25 VITALS — BP 130/74 | HR 74 | Ht 72.0 in | Wt 249.6 lb

## 2020-10-25 DIAGNOSIS — I2510 Atherosclerotic heart disease of native coronary artery without angina pectoris: Secondary | ICD-10-CM | POA: Diagnosis not present

## 2020-10-25 DIAGNOSIS — I10 Essential (primary) hypertension: Secondary | ICD-10-CM | POA: Diagnosis not present

## 2020-10-25 DIAGNOSIS — E782 Mixed hyperlipidemia: Secondary | ICD-10-CM | POA: Diagnosis not present

## 2020-10-25 NOTE — Patient Instructions (Signed)

## 2020-11-11 ENCOUNTER — Other Ambulatory Visit: Payer: Self-pay | Admitting: Cardiology

## 2020-11-15 ENCOUNTER — Ambulatory Visit: Payer: Medicare Other | Admitting: Endocrinology

## 2020-11-15 DIAGNOSIS — E119 Type 2 diabetes mellitus without complications: Secondary | ICD-10-CM | POA: Diagnosis not present

## 2020-11-27 ENCOUNTER — Other Ambulatory Visit: Payer: Self-pay

## 2020-11-29 ENCOUNTER — Ambulatory Visit: Payer: Medicare Other | Admitting: Endocrinology

## 2020-11-29 ENCOUNTER — Other Ambulatory Visit: Payer: Self-pay

## 2020-11-29 VITALS — BP 142/84 | HR 76 | Ht 73.0 in | Wt 250.4 lb

## 2020-11-29 DIAGNOSIS — E1151 Type 2 diabetes mellitus with diabetic peripheral angiopathy without gangrene: Secondary | ICD-10-CM | POA: Diagnosis not present

## 2020-11-29 LAB — POCT GLYCOSYLATED HEMOGLOBIN (HGB A1C): Hemoglobin A1C: 6.6 % — AB (ref 4.0–5.6)

## 2020-11-29 NOTE — Progress Notes (Signed)
Subjective:    Patient ID: [name] [name], male    DOB: [birth year], 65 y.o.   MRN: [id number]  HPI Pt returns for f/u of diabetes mellitus:  DM type: 2 Dx'ed: 9622 Complications: CAD, PN and CVA.   Therapy: Trulicity and 4 oral meds.  DKA: never Severe hypoglycemia: never.   Pancreatitis: never.   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  Interval history: pt states he feels better in general.  He takes meds as rx'ed.    Past Medical History:  Diagnosis Date  . Arthritis   . Cholelithiasis   . Coronary atherosclerosis of native coronary artery    DES RCA 10/05  . Essential hypertension   . Hyperlipidemia   . OA (osteoarthritis)   . Stroke [facility])    Left pons 6/11 - Aggrenox started  . Testicular cancer ([place]) 1978  . Type 2 diabetes mellitus ([place])   . Urolithiasis     Past Surgical History:  Procedure Laterality Date  . Colonoscopy    . CORONARY STENT PLACEMENT  2005  . Testicular tumor resection     Right side    Social History   Socioeconomic History  . Marital status: Married    Spouse name: [name]  . Number of children: 0  . Years of education: Not on file  . Highest education level: Not on file  Occupational History  . Occupation: Truck Geophysicist/field seismologist  Tobacco Use  . Smoking status: Former Smoker    Packs/day: 1.50    Years: 43.00    Pack years: 64.50    Types: Cigarettes    Quit date: 04/21/2010    Years since quitting: 10.6  . Smokeless tobacco: Never Used  Vaping Use  . Vaping Use: Never used  Substance and Sexual Activity  . Alcohol use: Yes    Alcohol/week: 0.0 standard drinks    Comment: "Not much..1 (6 pack) a year"  . Drug use: No  . Sexual activity: Not on file  Other Topics Concern  . Not on file  Social History Narrative  . Not on file   Social Determinants of Health   Financial Resource Strain: Not on file  Food Insecurity: Not on file  Transportation Needs: Not on file   Physical Activity: Not on file  Stress: Not on file  Social Connections: Not on file  Intimate Partner Violence: Not on file    Current Outpatient Medications on File Prior to Visit  Medication Sig Dispense Refill  . aspirin EC 81 MG tablet Take 1 tablet (81 mg total) by mouth daily. 90 tablet 3  . bromocriptine (PARLODEL) 2.5 MG tablet Take 1/2 tablet daily 45 tablet 1  . Dulaglutide (TRULICITY) 4.5 WL/7.9GX SOPN Inject 4.5 mg into the skin once a week. 6 mL 3  . empagliflozin (JARDIANCE) 25 MG TABS tablet Take 1 tablet (25 mg total) by mouth daily. 90 tablet 0  . fish oil-omega-3 fatty acids 1000 MG capsule Take 1 capsule by mouth 2 (two) times daily.    Marland Kitchen glucose blood (ONETOUCH VERIO) test strip 1 each by Other route daily. And lancets 1/day 100 each 3  . metFORMIN (GLUCOPHAGE-XR) 500 MG 24 hr tablet Take 2 tablets (1,000 mg total) by mouth daily. 180 tablet 1  . Multiple Vitamin (MULTIVITAMIN) tablet Take 1 tablet by mouth daily.    . nitroGLYCERIN (NITROSTAT) 0.4 MG SL tablet DISSOLVE 1 TABLET UNDER THE TONGUE EVERY 5  MINUTES FOR 3 DOSES AS NEEDED. MAY REPEAT EVERY 5 MINUTES FOR UP TO 3 DOSES. 25 tablet 3  . pioglitazone (ACTOS) 15 MG tablet Take 1 tablet (15 mg total) by mouth daily. 90 tablet 1  . simvastatin (ZOCOR) 40 MG tablet TAKE 1 TABLET BY MOUTH IN  THE EVENING 90 tablet 0   No current facility-administered medications on file prior to visit.    No Known Allergies  Family History  Problem Relation Age of Onset  . Heart disease Father   . Diabetes Father   . Heart attack Father   . Colon cancer Neg Hx   . Esophageal cancer Neg Hx   . Rectal cancer Neg Hx   . Stomach cancer Neg Hx   . Colon polyps Neg Hx     BP (!) 142/84 (BP Location: Right Arm, Patient Position: Sitting, Cuff Size: Large)   Pulse 76   Ht 6\' 1"  (1.854 m)   Wt 250 lb 6.4 oz (113.6 kg)   SpO2 96%   BMI 33.04 kg/m    Review of Systems Denies n/v    Objective:   Physical Exam VITAL  SIGNS:  See vs page GENERAL: no distress Pulses: dorsalis pedis intact bilat.   MSK: no deformity of the feet CV: no leg edema Skin:  no ulcer on the feet.  normal color and temp on the feet. Neuro: sensation is intact to touch on the feet   Lab Results  Component Value Date   HGBA1C 6.6 (A) 11/29/2020       Assessment & Plan:  Type 2 DM: well-controlled  Patient Instructions  Please continue the same 5 diabetes medications.   check your blood sugar once a day.  vary the time of day when you check, between before the 3 meals, and at bedtime.  also check if you have symptoms of your blood sugar being too high or too low.  please keep a record of the readings and bring it to your next appointment here (or you can bring the meter itself).  You can write it on any piece of paper.  please call us sooner if your blood sugar goes below 70, or if you have a lot of readings over 200.   Please come back for a follow-up appointment in 6 months.

## 2020-11-29 NOTE — Patient Instructions (Addendum)
Please continue the same 5 diabetes medications.   check your blood sugar once a day.  vary the time of day when you check, between before the 3 meals, and at bedtime.  also check if you have symptoms of your blood sugar being too high or too low.  please keep a record of the readings and bring it to your next appointment here (or you can bring the meter itself).  You can write it on any piece of paper.  please call us sooner if your blood sugar goes below 70, or if you have a lot of readings over 200.   Please come back for a follow-up appointment in 6 months.

## 2020-12-01 ENCOUNTER — Other Ambulatory Visit: Payer: Self-pay | Admitting: Endocrinology

## 2020-12-01 DIAGNOSIS — E1151 Type 2 diabetes mellitus with diabetic peripheral angiopathy without gangrene: Secondary | ICD-10-CM

## 2020-12-13 DIAGNOSIS — H2513 Age-related nuclear cataract, bilateral: Secondary | ICD-10-CM | POA: Diagnosis not present

## 2020-12-13 LAB — HM DIABETES EYE EXAM

## 2021-01-13 ENCOUNTER — Other Ambulatory Visit: Payer: Self-pay | Admitting: Endocrinology

## 2021-01-13 DIAGNOSIS — E1151 Type 2 diabetes mellitus with diabetic peripheral angiopathy without gangrene: Secondary | ICD-10-CM

## 2021-02-16 ENCOUNTER — Other Ambulatory Visit: Payer: Self-pay | Admitting: Endocrinology

## 2021-02-16 ENCOUNTER — Other Ambulatory Visit: Payer: Self-pay | Admitting: Cardiology

## 2021-02-16 DIAGNOSIS — E1151 Type 2 diabetes mellitus with diabetic peripheral angiopathy without gangrene: Secondary | ICD-10-CM

## 2021-06-03 ENCOUNTER — Telehealth: Payer: Self-pay

## 2021-06-06 ENCOUNTER — Ambulatory Visit: Payer: Medicare Other | Admitting: Endocrinology

## 2021-06-06 NOTE — Telephone Encounter (Signed)
NA

## 2021-06-24 ENCOUNTER — Other Ambulatory Visit: Payer: Self-pay | Admitting: Endocrinology

## 2021-06-24 DIAGNOSIS — E1151 Type 2 diabetes mellitus with diabetic peripheral angiopathy without gangrene: Secondary | ICD-10-CM

## 2021-07-18 ENCOUNTER — Other Ambulatory Visit: Payer: Self-pay

## 2021-07-18 ENCOUNTER — Ambulatory Visit (INDEPENDENT_AMBULATORY_CARE_PROVIDER_SITE_OTHER): Payer: Medicare Other | Admitting: Endocrinology

## 2021-07-18 VITALS — BP 110/58 | HR 72 | Ht 73.0 in | Wt 248.6 lb

## 2021-07-18 DIAGNOSIS — E1151 Type 2 diabetes mellitus with diabetic peripheral angiopathy without gangrene: Secondary | ICD-10-CM | POA: Diagnosis not present

## 2021-07-18 DIAGNOSIS — Z125 Encounter for screening for malignant neoplasm of prostate: Secondary | ICD-10-CM

## 2021-07-18 LAB — LIPID PANEL
Cholesterol: 148 mg/dL (ref 0–200)
HDL: 58.3 mg/dL (ref >39.00)
LDL Cholesterol: 61 mg/dL (ref 0–99)
NonHDL: 89.6
Total CHOL/HDL Ratio: 3
Triglycerides: 142 mg/dL (ref 0.0–149.0)
VLDL: 28.4 mg/dL (ref 0.0–40.0)

## 2021-07-18 LAB — T4, FREE: Free T4: 0.7 ng/dL (ref 0.60–1.60)

## 2021-07-18 LAB — HEPATIC FUNCTION PANEL
ALT: 35 U/L (ref 0–53)
AST: 26 U/L (ref 0–37)
Albumin: 4.6 g/dL (ref 3.5–5.2)
Alkaline Phosphatase: 58 U/L (ref 39–117)
Bilirubin, Direct: 0.2 mg/dL (ref 0.0–0.3)
Total Bilirubin: 0.9 mg/dL (ref 0.2–1.2)
Total Protein: 7 g/dL (ref 6.0–8.3)

## 2021-07-18 LAB — POCT GLYCOSYLATED HEMOGLOBIN (HGB A1C): Hemoglobin A1C: 6.8 % — AB (ref 4.0–5.6)

## 2021-07-18 LAB — BASIC METABOLIC PANEL
BUN: 18 mg/dL (ref 6–23)
CO2: 27 mEq/L (ref 19–32)
Calcium: 10 mg/dL (ref 8.4–10.5)
Chloride: 104 mEq/L (ref 96–112)
Creatinine, Ser: 1.12 mg/dL (ref 0.40–1.50)
GFR: 68.19 mL/min (ref >60.00)
Glucose, Bld: 129 mg/dL — ABNORMAL HIGH (ref 70–99)
Potassium: 4.4 mEq/L (ref 3.5–5.1)
Sodium: 140 mEq/L (ref 135–145)

## 2021-07-18 LAB — CBC WITH DIFFERENTIAL/PLATELET
Basophils Absolute: 0 10*3/uL (ref 0.0–0.1)
Basophils Relative: 0.3 % (ref 0.0–3.0)
Eosinophils Absolute: 0.1 10*3/uL (ref 0.0–0.7)
Eosinophils Relative: 2.1 % (ref 0.0–5.0)
HCT: 46.4 % (ref 39.0–52.0)
Hemoglobin: 15.8 g/dL (ref 13.0–17.0)
Lymphocytes Relative: 37 % (ref 12.0–46.0)
Lymphs Abs: 2.5 10*3/uL (ref 0.7–4.0)
MCHC: 34 g/dL (ref 30.0–36.0)
MCV: 87 fl (ref 78.0–100.0)
Monocytes Absolute: 0.4 10*3/uL (ref 0.1–1.0)
Monocytes Relative: 6.6 % (ref 3.0–12.0)
Neutro Abs: 3.6 10*3/uL (ref 1.4–7.7)
Neutrophils Relative %: 54 % (ref 43.0–77.0)
Platelets: 153 10*3/uL (ref 150.0–400.0)
RBC: 5.33 Mil/uL (ref 4.22–5.81)
RDW: 14.4 % (ref 11.5–15.5)
WBC: 6.7 10*3/uL (ref 4.0–10.5)

## 2021-07-18 LAB — PSA: PSA: 0.3 ng/mL (ref 0.10–4.00)

## 2021-07-18 LAB — TSH: TSH: 1.18 u[IU]/mL (ref 0.35–5.50)

## 2021-07-18 MED ORDER — BROMOCRIPTINE MESYLATE 2.5 MG PO TABS: ORAL_TABLET | ORAL | 3 refills | Status: DC

## 2021-07-18 MED ORDER — TRULICITY 4.5 MG/0.5ML ~~LOC~~ SOAJ
4.5000 mg | SUBCUTANEOUS | 3 refills | Status: DC
Start: 2021-07-18 — End: 2021-10-09

## 2021-07-18 NOTE — Progress Notes (Signed)
Subjective:    Patient ID: [name] [name], male    DOB: 07-Apr-1956, 65 y.o.   MRN: [id number]  HPI Pt returns for f/u of diabetes mellitus:  DM type: 2 Dx'ed: 4098 Complications: CAD, PN and CVA.   Therapy: Trulicity and 4 oral meds.  DKA: never Severe hypoglycemia: never.   Pancreatitis: never.   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    Interval history: pt states he feels better in general.  He takes meds as rx'ed.   Past Medical History:  Diagnosis Date   Arthritis    Cholelithiasis    Coronary atherosclerosis of native coronary artery    DES RCA 10/05   Essential hypertension    Hyperlipidemia    OA (osteoarthritis)    Stroke (HCC)    Left pons 6/11 - Aggrenox started   Testicular cancer ([place]) 1978   Type 2 diabetes mellitus ([place])    Urolithiasis     Past Surgical History:  Procedure Laterality Date   Colonoscopy     CORONARY STENT PLACEMENT  2005   Testicular tumor resection     Right side    Social History   Socioeconomic History   Marital status: Married    Spouse name: [name]   Number of children: 0   Years of education: Not on file   Highest education level: Not on file  Occupational History   Occupation: Truck driver  Tobacco Use   Smoking status: Former    Packs/day: 1.50    Years: 43.00    Pack years: 64.50    Types: Cigarettes    Quit date: 04/21/2010    Years since quitting: 11.2   Smokeless tobacco: Never  Vaping Use   Vaping Use: Never used  Substance and Sexual Activity   Alcohol use: Yes    Alcohol/week: 0.0 standard drinks    Comment: "Not much..1 (6 pack) a year"   Drug use: No   Sexual activity: Not on file  Other Topics Concern   Not on file  Social History Narrative   Not on file   Social Determinants of Health   Financial Resource Strain: Not on file  Food Insecurity: Not on file  Transportation Needs: Not on file  Physical Activity: Not on file  Stress:  Not on file  Social Connections: Not on file  Intimate Partner Violence: Not on file    Current Outpatient Medications on File Prior to Visit  Medication Sig Dispense Refill   aspirin EC 81 MG tablet Take 1 tablet (81 mg total) by mouth daily. 90 tablet 3   fish oil-omega-3 fatty acids 1000 MG capsule Take 1 capsule by mouth 2 (two) times daily.     glucose blood (ONETOUCH VERIO) test strip 1 each by Other route daily. And lancets 1/day 100 each 3   JARDIANCE 25 MG TABS tablet TAKE 1 TABLET BY MOUTH  DAILY 90 tablet 3   metFORMIN (GLUCOPHAGE-XR) 500 MG 24 hr tablet TAKE 2 TABLETS BY MOUTH  DAILY 180 tablet 3   Multiple Vitamin (MULTIVITAMIN) tablet Take 1 tablet by mouth daily.     nitroGLYCERIN (NITROSTAT) 0.4 MG SL tablet DISSOLVE 1 TABLET UNDER THE TONGUE EVERY 5 MINUTES FOR 3 DOSES AS NEEDED. MAY REPEAT EVERY 5 MINUTES FOR UP TO 3 DOSES. 25 tablet 3   pioglitazone (ACTOS) 15 MG tablet TAKE 1 TABLET BY MOUTH  DAILY 90 tablet 3   simvastatin (  ZOCOR) 40 MG tablet TAKE 1 TABLET BY MOUTH IN  THE EVENING 90 tablet 3   No current facility-administered medications on file prior to visit.    No Known Allergies  Family History  Problem Relation Age of Onset   Heart disease Father    Diabetes Father    Heart attack Father    Colon cancer Neg Hx    Esophageal cancer Neg Hx    Rectal cancer Neg Hx    Stomach cancer Neg Hx    Colon polyps Neg Hx     BP (!) 110/58 (BP Location: Right Arm, Patient Position: Sitting, Cuff Size: Large)   Pulse 72   Ht 6\' 1"  (1.854 m)   Wt 248 lb 9.6 oz (112.8 kg)   SpO2 96%   BMI 32.80 kg/m    Review of Systems He denies hypoglycemia/n/v/HB    Objective:   Physical Exam Pulses: dorsalis pedis intact bilat.   MSK: no deformity of the feet CV: no leg edema Skin:  no ulcer on the feet.  normal color and temp on the feet. Neuro: sensation is intact to touch on the feet.    Lab Results  Component Value Date   HGBA1C 6.8 (A) 07/18/2021       Assessment & Plan:  Type 2 DM: well-controlled Dyslipidemia: we discussed.  Check this and other labs today.  He sees PCP next week.  Patient Instructions  Please continue the same 5 diabetes medications.   check your blood sugar once a day.  vary the time of day when you check, between before the 3 meals, and at bedtime.  also check if you have symptoms of your blood sugar being too high or too low.  please keep a record of the readings and bring it to your next appointment here (or you can bring the meter itself).  You can write it on any piece of paper.  please call us sooner if your blood sugar goes below 70, or if you have a lot of readings over 200.   Please come back for a follow-up appointment in 6 months.

## 2021-07-18 NOTE — Patient Instructions (Signed)
Please continue the same 5 diabetes medications.   check your blood sugar once a day.  vary the time of day when you check, between before the 3 meals, and at bedtime.  also check if you have symptoms of your blood sugar being too high or too low.  please keep a record of the readings and bring it to your next appointment here (or you can bring the meter itself).  You can write it on any piece of paper.  please call us sooner if your blood sugar goes below 70, or if you have a lot of readings over 200.   Please come back for a follow-up appointment in 6 months.

## 2021-07-25 DIAGNOSIS — J439 Emphysema, unspecified: Secondary | ICD-10-CM | POA: Diagnosis not present

## 2021-07-25 DIAGNOSIS — Z0001 Encounter for general adult medical examination with abnormal findings: Secondary | ICD-10-CM | POA: Diagnosis not present

## 2021-08-26 ENCOUNTER — Telehealth: Payer: Self-pay | Admitting: Cardiology

## 2021-08-26 MED ORDER — NITROGLYCERIN 0.4 MG SL SUBL: SUBLINGUAL_TABLET | SUBLINGUAL | 3 refills | Status: DC

## 2021-08-26 NOTE — Telephone Encounter (Signed)
 *  STAT* If patient is at the pharmacy, call can be transferred to refill team.   1. Which medications need to be refilled? (please list name of each medication and dose if known) nitroGLYCERIN (NITROSTAT) 0.4 MG SL tablet  2. Which pharmacy/location (including street and city if local pharmacy) is medication to be sent to? [facility] ([place], [place]  3. Do they need a 30 day or 90 day supply? 90 days

## 2021-09-12 DIAGNOSIS — R109 Unspecified abdominal pain: Secondary | ICD-10-CM | POA: Diagnosis not present

## 2021-10-02 DIAGNOSIS — R1031 Right lower quadrant pain: Secondary | ICD-10-CM | POA: Diagnosis not present

## 2021-10-09 ENCOUNTER — Other Ambulatory Visit: Payer: Self-pay | Admitting: Endocrinology

## 2021-10-09 ENCOUNTER — Telehealth: Payer: Self-pay | Admitting: Endocrinology

## 2021-10-09 MED ORDER — OZEMPIC (2 MG/DOSE) 8 MG/3ML ~~LOC~~ SOPN: 2.0000 mg | PEN_INJECTOR | SUBCUTANEOUS | 3 refills | Status: DC

## 2021-10-09 NOTE — Telephone Encounter (Signed)
Patient's wife [name] called re: PHARM told [name] there is a Hospital doctor. [name] requests a new Rx for an alternative for Trulicity be sent to:   [facility] ([place], [place] Phone:  [phone]  Fax:  [phone]

## 2021-10-10 DIAGNOSIS — R1031 Right lower quadrant pain: Secondary | ICD-10-CM | POA: Diagnosis not present

## 2021-10-10 DIAGNOSIS — I700 Atherosclerosis of aorta: Secondary | ICD-10-CM | POA: Diagnosis not present

## 2021-10-28 ENCOUNTER — Ambulatory Visit: Payer: Medicare Other | Admitting: Cardiovascular Disease

## 2021-10-31 DIAGNOSIS — N261 Atrophy of kidney (terminal): Secondary | ICD-10-CM | POA: Diagnosis not present

## 2021-10-31 DIAGNOSIS — K760 Fatty (change of) liver, not elsewhere classified: Secondary | ICD-10-CM | POA: Diagnosis not present

## 2021-10-31 DIAGNOSIS — R1031 Right lower quadrant pain: Secondary | ICD-10-CM | POA: Diagnosis not present

## 2021-11-14 ENCOUNTER — Encounter: Payer: Self-pay | Admitting: Endocrinology

## 2021-11-17 ENCOUNTER — Telehealth: Payer: Self-pay

## 2021-11-17 NOTE — Telephone Encounter (Signed)
Incoming call from Pt's wife [name] [name] and she stated that pt can not take the Ozempic 2mg . Pt has taken 3 dosage and it has been making pt feel sick and muscles ache. She stated that the Trulicity was working fine.  Please Advise

## 2021-11-24 ENCOUNTER — Telehealth: Payer: Self-pay

## 2021-11-24 NOTE — Telephone Encounter (Signed)
Patient's appt has now been moved up to 11/27/21 @3 :00 p.m. based on patient's availability

## 2021-11-24 NOTE — Telephone Encounter (Signed)
Wife called and stated that husband [name] [name] is not able to take Ozempic and Trulicity is on back order. Also stated that got some kind of reaction from [place] on his arms and neck so he is not taking anything this minute and wanted to know if he needs to come in to see you or advise them on the next step.

## 2021-11-27 ENCOUNTER — Ambulatory Visit (INDEPENDENT_AMBULATORY_CARE_PROVIDER_SITE_OTHER): Payer: Medicare Other | Admitting: Endocrinology

## 2021-11-27 ENCOUNTER — Other Ambulatory Visit: Payer: Self-pay

## 2021-11-27 VITALS — BP 132/74 | HR 69 | Ht 73.0 in | Wt 252.8 lb

## 2021-11-27 DIAGNOSIS — E1151 Type 2 diabetes mellitus with diabetic peripheral angiopathy without gangrene: Secondary | ICD-10-CM | POA: Diagnosis not present

## 2021-11-27 LAB — POCT GLYCOSYLATED HEMOGLOBIN (HGB A1C): Hemoglobin A1C: 7.2 % — AB (ref 4.0–5.6)

## 2021-11-27 MED ORDER — RYBELSUS 7 MG PO TABS: 7.0000 mg | ORAL_TABLET | Freq: Every day | ORAL | 3 refills | Status: DC

## 2021-11-27 NOTE — Patient Instructions (Addendum)
I have sent a prescription to your pharmacy, to change the Ozempic to Rybelsus.   ?Please continue the same other diabetes medications.   ?check your blood sugar once a day.  vary the time of day when you check, between before the 3 meals, and at bedtime.  also check if you have symptoms of your blood sugar being too high or too low.  please keep a record of the readings and bring it to your next appointment here (or you can bring the meter itself).  You can write it on any piece of paper.  please call us sooner if your blood sugar goes below 70, or if you have a lot of readings over 200.   ?Please come back for a follow-up appointment in May.    ?

## 2021-11-27 NOTE — Progress Notes (Signed)
? ?Subjective:  ? ? Patient ID: [name] [name], male    DOB: 1955-10-10, 66 y.o.   MRN: [id number] ? ?HPI ?Pt returns for f/u of diabetes mellitus:  ?DM type: 2 ?Dx'ed: 1999 ?Complications: CAD, PN and CVA.   ?Therapy: Ozempic and 4 oral meds.  ?DKA: never ?Severe hypoglycemia: never.   ?Pancreatitis: never.   ?Other: he has never taken insulin; he works as a Pharmacist, community; he did not tolerate colestipol (abd pain).    ?Interval history: pt states he feels better in general.  He takes meds as rx'ed.  He did not tolerate Ozempic (abd pain and paresthesias).  He stopped 2 weeks ago.  Pt says he did not have these sxs on Trulicity.   ?Past Medical History:  ?Diagnosis Date  ? Arthritis   ? Cholelithiasis   ? Coronary atherosclerosis of native coronary artery   ? DES RCA 10/05  ? Essential hypertension   ? Hyperlipidemia   ? OA (osteoarthritis)   ? Stroke [facility])   ? Left pons 6/11 - Aggrenox started  ? Testicular cancer ([place]) 1978  ? Type 2 diabetes mellitus ([place])   ? Urolithiasis   ? ? ?Past Surgical History:  ?Procedure Laterality Date  ? Colonoscopy    ? CORONARY STENT PLACEMENT  2005  ? Testicular tumor resection    ? Right side  ? ? ?Social History  ? ?Socioeconomic History  ? Marital status: Married  ?  Spouse name: [name]  ? Number of children: 0  ? Years of education: Not on file  ? Highest education level: Not on file  ?Occupational History  ? Occupation: Truck Geophysicist/field seismologist  ?Tobacco Use  ? Smoking status: Former  ?  Packs/day: 1.50  ?  Years: 43.00  ?  Pack years: 64.50  ?  Types: Cigarettes  ?  Quit date: 04/21/2010  ?  Years since quitting: 11.6  ? Smokeless tobacco: Never  ?Vaping Use  ? Vaping Use: Never used  ?Substance and Sexual Activity  ? Alcohol use: Yes  ?  Alcohol/week: 0.0 standard drinks  ?  Comment: "Not much..1 (6 pack) a year"  ? Drug use: No  ? Sexual activity: Not on file  ?Other Topics Concern  ? Not on file  ?Social History Narrative  ? Not on file  ? ?Social Determinants of Health  ? ?Financial Resource  Strain: Not on file  ?Food Insecurity: Not on file  ?Transportation Needs: Not on file  ?Physical Activity: Not on file  ?Stress: Not on file  ?Social Connections: Not on file  ?Intimate Partner Violence: Not on file  ? ? ?Current Outpatient Medications on File Prior to Visit  ?Medication Sig Dispense Refill  ? aspirin EC 81 MG tablet Take 1 tablet (81 mg total) by mouth daily. 90 tablet 3  ? bromocriptine (PARLODEL) 2.5 MG tablet TAKE ONE-HALF TABLET BY  MOUTH DAILY 45 tablet 3  ? fish oil-omega-3 fatty acids 1000 MG capsule Take 1 capsule by mouth 2 (two) times daily.    ? glucose blood (ONETOUCH VERIO) test strip 1 each by Other route daily. And lancets 1/day 100 each 3  ? JARDIANCE 25 MG TABS tablet TAKE 1 TABLET BY MOUTH  DAILY 90 tablet 3  ? metFORMIN (GLUCOPHAGE-XR) 500 MG 24 hr tablet TAKE 2 TABLETS BY MOUTH  DAILY 180 tablet 3  ? Multiple Vitamin (MULTIVITAMIN) tablet Take 1 tablet by mouth daily.    ? nitroGLYCERIN (NITROSTAT) 0.4 MG SL tablet DISSOLVE 1 TABLET  UNDER THE TONGUE EVERY 5 MINUTES FOR 3 DOSES AS NEEDED. MAY REPEAT EVERY 5 MINUTES FOR UP TO 3 DOSES. 25 tablet 3  ? pioglitazone (ACTOS) 15 MG tablet TAKE 1 TABLET BY MOUTH  DAILY 90 tablet 3  ? simvastatin (ZOCOR) 40 MG tablet TAKE 1 TABLET BY MOUTH IN  THE EVENING 90 tablet 3  ? ?No current facility-administered medications on file prior to visit.  ? ? ?No Known Allergies ? ?Family History  ?Problem Relation Age of Onset  ? Heart disease Father   ? Diabetes Father   ? Heart attack Father   ? Colon cancer Neg Hx   ? Esophageal cancer Neg Hx   ? Rectal cancer Neg Hx   ? Stomach cancer Neg Hx   ? Colon polyps Neg Hx   ? ? ?BP 132/74   Pulse 69   Ht 6\' 1"  (1.854 m)   Wt 252 lb 12.8 oz (114.7 kg)   SpO2 97%   BMI 33.35 kg/m?  ? ? ?Review of Systems ? ?   ?Objective:  ? Physical Exam ?VITAL SIGNS:  See vs page ?GENERAL: no distress ? ? ? ?Lab Results  ?Component Value Date  ? HGBA1C 7.2 (A) 11/27/2021  ? ?   ?Assessment & Plan:  ?Type 2 DM:  uncontrolled ?Abd pain, due to Rybelsus.  We discussed.  He wants to continue GLP rx.   ? ?Patient Instructions  ?I have sent a prescription to your pharmacy, to change the Ozempic to Rybelsus.   ?Please continue the same other diabetes medications.   ?check your blood sugar once a day.  vary the time of day when you check, between before the 3 meals, and at bedtime.  also check if you have symptoms of your blood sugar being too high or too low.  please keep a record of the readings and bring it to your next appointment here (or you can bring the meter itself).  You can write it on any piece of paper.  please call us sooner if your blood sugar goes below 70, or if you have a lot of readings over 200.   ?Please come back for a follow-up appointment in May.    ? ? ?

## 2021-11-28 ENCOUNTER — Telehealth: Payer: Self-pay

## 2021-11-28 MED ORDER — OZEMPIC (0.25 OR 0.5 MG/DOSE) 2 MG/1.5ML ~~LOC~~ SOPN: 0.5000 mg | PEN_INJECTOR | SUBCUTANEOUS | 3 refills | Status: DC

## 2021-11-28 NOTE — Telephone Encounter (Signed)
Message sent thru MyChart 

## 2021-11-28 NOTE — Telephone Encounter (Signed)
Pt wife stated that the rybelsus was too expensive so they would like to stay on Ozempic with a decreased amt. ?

## 2021-11-30 ENCOUNTER — Other Ambulatory Visit: Payer: Self-pay | Admitting: Endocrinology

## 2021-11-30 DIAGNOSIS — E1151 Type 2 diabetes mellitus with diabetic peripheral angiopathy without gangrene: Secondary | ICD-10-CM

## 2021-12-30 ENCOUNTER — Other Ambulatory Visit: Payer: Self-pay | Admitting: Endocrinology

## 2021-12-30 MED ORDER — OZEMPIC (0.25 OR 0.5 MG/DOSE) 2 MG/3ML ~~LOC~~ SOPN: 0.5000 mg | PEN_INJECTOR | SUBCUTANEOUS | 3 refills | Status: DC

## 2022-01-02 ENCOUNTER — Ambulatory Visit: Payer: Medicare Other | Admitting: Student

## 2022-01-02 ENCOUNTER — Encounter: Payer: Self-pay | Admitting: Student

## 2022-01-02 ENCOUNTER — Ambulatory Visit (INDEPENDENT_AMBULATORY_CARE_PROVIDER_SITE_OTHER): Payer: Medicare Other | Admitting: Student

## 2022-01-02 VITALS — BP 138/68 | HR 78 | Ht 72.0 in | Wt 251.0 lb

## 2022-01-02 DIAGNOSIS — I2510 Atherosclerotic heart disease of native coronary artery without angina pectoris: Secondary | ICD-10-CM | POA: Diagnosis not present

## 2022-01-02 DIAGNOSIS — E785 Hyperlipidemia, unspecified: Secondary | ICD-10-CM | POA: Diagnosis not present

## 2022-01-02 DIAGNOSIS — R030 Elevated blood-pressure reading, without diagnosis of hypertension: Secondary | ICD-10-CM

## 2022-01-02 NOTE — Patient Instructions (Signed)
Follow-Up: ?Follow up with Dr. [name] in 1 year ? ?Any Other Special Instructions Will Be Listed Below (If Applicable). ? ? ? ? ?If you need a refill on your cardiac medications before your next appointment, please call your pharmacy. ? ?

## 2022-01-02 NOTE — Progress Notes (Signed)
? ?Cardiology Office Note   ? ?Date:  01/02/2022  ? ?ID:  [name] [name], DOB [birth year], MRN [id number] ? ?PCP:  [facility]  ?Cardiologist: [name], MD   ? ?Chief Complaint  ?Patient presents with  ? Follow-up  ?  Annual Visit  ? ? ?History of Present Illness:   ? ?[name] [name] is a 66 y.o. male with past medical history of CAD (s/p DES to RCA in 2005, low-risk NST's in 2017, 2019 and 09/2020), HLD, Type 2 DM, prior CVA and history of testicular cancer who presents to the office today for annual follow-up. ? 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 He denied any recent anginal symptoms and was continued on his current cardiac medications including ASA 81 mg daily and Simvastatin 40 mg daily. ? ?In talking with the patient today, he reports overall doing well from a cardiac perspective since his last office visit. 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 He reports remaining active on the weekends by doing yard work and also restoring old cars. He denies any recent chest pain or progressive dyspnea on exertion. Reports having some intermittent dyspnea since prior COVID-19 diagnosis but this has been stable. No recent orthopnea, PND, pitting edema or palpitations. ? ? ?Past Medical History:  ?Diagnosis Date  ? Arthritis   ? Cholelithiasis   ? Coronary atherosclerosis of native coronary artery   ? DES RCA 10/05  ? Essential hypertension   ? Hyperlipidemia   ? OA (osteoarthritis)   ? Stroke [facility])   ? Left pons 6/11 - Aggrenox started  ? Testicular cancer ([place]) 1978  ? Type 2 diabetes mellitus ([place])   ? Urolithiasis   ? ? ?Past Surgical History:  ?Procedure Laterality Date  ? Colonoscopy    ? CORONARY STENT PLACEMENT  2005  ? Testicular tumor resection    ? Right side   ? ? ?Current Medications: ?Outpatient Medications Prior to Visit  ?Medication Sig Dispense Refill  ? aspirin EC 81 MG tablet Take 1 tablet (81 mg total) by mouth daily. 90 tablet 3  ? bromocriptine (PARLODEL) 2.5 MG tablet TAKE ONE-HALF TABLET BY  MOUTH DAILY 45 tablet 3  ? fish oil-omega-3 fatty acids 1000 MG capsule Take 1 capsule by mouth 2 (two) times daily.    ? glucose blood (ONETOUCH VERIO) test strip 1 each by Other route daily. And lancets 1/day 100 each 3  ? JARDIANCE 25 MG TABS tablet TAKE 1 TABLET BY MOUTH  DAILY 90 tablet 3  ? metFORMIN (GLUCOPHAGE-XR) 500 MG 24 hr tablet TAKE 2 TABLETS BY MOUTH  DAILY 180 tablet 3  ? Multiple Vitamin (MULTIVITAMIN) tablet Take 1 tablet by mouth daily.    ? nitroGLYCERIN (NITROSTAT) 0.4 MG SL tablet DISSOLVE 1 TABLET UNDER THE TONGUE EVERY 5 MINUTES FOR 3 DOSES AS NEEDED. MAY REPEAT EVERY 5 MINUTES FOR UP TO 3 DOSES. 25 tablet 3  ? pioglitazone (ACTOS) 15 MG tablet TAKE 1 TABLET BY MOUTH  DAILY 90 tablet 3  ? Semaglutide,0.25 or 0.'5MG'$ /DOS, (OZEMPIC, 0.25 OR 0.5 MG/DOSE,) 2 MG/3ML SOPN Inject 0.5 mg into the skin once a week. 9 mL 3  ? simvastatin (ZOCOR) 40 MG tablet TAKE 1 TABLET BY MOUTH IN  THE EVENING  90 tablet 3  ? ?No facility-administered medications prior to visit.  ?  ? ?Allergies:   Patient has no known allergies.  ? ?Social History  ? ?Socioeconomic History  ? Marital status: Married  ?  Spouse name: [name]  ? Number of children: 0  ? Years of education: Not on file  ? Highest education level: Not on file  ?Occupational History  ? Occupation: Truck Geophysicist/field seismologist  ?Tobacco Use  ? Smoking status: Former  ?  Packs/day: 1.50  ?  Years: 43.00  ?  Pack years: 64.50  ?  Types: Cigarettes  ?  Quit date: 04/21/2010  ?  Years since quitting: 11.7  ? Smokeless tobacco: Never  ?Vaping Use  ? Vaping Use: Never used  ?Substance and Sexual Activity  ? Alcohol use: Yes  ?  Comment: "Not much..1 (6 pack) a year"  ? Drug use: No  ? Sexual activity: Not on file  ?Other Topics Concern   ? Not on file  ?Social History Narrative  ? Not on file  ? ?Social Determinants of Health  ? ?Financial Resource Strain: Not on file  ?Food Insecurity: Not on file  ?Transportation Needs: Not on file  ?Physical Activity: Not on file  ?Stress: Not on file  ?Social Connections: Not on file  ?  ? ?Family History:  The patient's family history includes Diabetes in his father; Heart attack in his father; Heart disease in his father.  ? ?Review of Systems:   ? ?Please see the history of present illness.    ? ?All other systems reviewed and are otherwise negative except as noted above. ? ? ?Physical Exam:   ? ?VS:  BP 138/68   Pulse 78   Ht 6' (1.829 m)   Wt 251 lb (113.9 kg)   SpO2 96%   BMI 34.04 kg/m?    ?General: Well developed, well nourished,male appearing in no acute distress. ?Head: Normocephalic, atraumatic. ?Neck: No carotid bruits. JVD not elevated.  ?Lungs: Respirations regular and unlabored, without wheezes or rales.  ?Heart: Regular rate and rhythm. No S3 or S4.  No murmur, no rubs, or gallops appreciated. ?Abdomen: Appears non-distended. No obvious abdominal masses. ?Msk:  Strength and tone appear normal for age. No obvious joint deformities or effusions. ?Extremities: No clubbing or cyanosis. No pitting edema.  Distal pedal pulses are 2+ bilaterally. ?Neuro: Alert and oriented X 3. Moves all extremities spontaneously. No focal deficits noted. ?Psych:  Responds to questions appropriately with a normal affect. ?Skin: No rashes or lesions noted ? ?Wt Readings from Last 3 Encounters:  ?01/02/22 251 lb (113.9 kg)  ?11/27/21 252 lb 12.8 oz (114.7 kg)  ?07/18/21 248 lb 9.6 oz (112.8 kg)  ?  ? ?Studies/Labs Reviewed:  ? ?EKG:  EKG is ordered today. The ekg ordered today demonstrates NSR, HR 77 with PAC. No acute ST changes when compared to prior tracings.  ? ?Recent Labs: ?07/18/2021: ALT 35; BUN 18; Creatinine, Ser 1.12; Hemoglobin 15.8; Platelets 153.0; Potassium 4.4; Sodium 140; TSH 1.18  ? ?Lipid Panel ?    ?Component Value Date/Time  ? CHOL 148 07/18/2021 1424  ? TRIG 142.0 07/18/2021 1424  ? TRIG 96 07/09/2006 1049  ? HDL 58.30 07/18/2021 1424  ? CHOLHDL 3 07/18/2021 1424  ? VLDL 28.4 07/18/2021 1424  ? [place] 61 07/18/2021 1424  ? LDLDIRECT 57.0 04/01/2018 1408  ? ? ?Additional studies/ records that were reviewed today include:  ? ?NST: 09/2020 ?No diagnostic ST segment changes to indicate ischemia. ?  No significant myocardial perfusion defects to indicate scar or ischemia. ?This is a low risk study. ?Nuclear stress EF: 76%. ?  ? ?Assessment:   ? ?1. Coronary artery disease involving native coronary artery of native heart without angina pectoris   ?2. Essential hypertension   ?3. Hyperlipidemia LDL goal <70   ? ? ? ?Plan:  ? ?In order of problems listed above: ? ?1. CAD ?- He is s/p DES to RCA in 2005 with low-risk NST's in 2017, 2019 and 09/2020. He does remain active at baseline and denies any anginal symptoms. EKG today is without acute ST changes. 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 I encouraged him to make [place] aware of any symptoms in the interim. ?- Continue ASA 81 mg daily and Simvastatin 40 mg daily. ? ?2.  HTN ?- His BP was initially recorded at 142/80, rechecked and improved to 138/68. He reports this has been well controlled at prior office visits. 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 Will continue with lifestyle modifications. ? ?3. HLD ?- FLP in 06/2021 showed total cholesterol 148, triglycerides 142, HDL 58 and LDL 61. He remains on Simvastatin 40 mg daily. ? ? ?Medication Adjustments/Labs and Tests Ordered: ?Current medicines are reviewed at length with the patient today.  Concerns regarding medicines are outlined above.  Medication changes, Labs and Tests ordered today are  listed in the Patient Instructions below. ?Patient Instructions  ?Follow-Up: ?Follow up with Dr. [name] in 1 year ? ?Any Other Special Instructions Will Be Listed Below (If Applicable). ? ? ? ? ?If you nee

## 2022-01-16 ENCOUNTER — Ambulatory Visit: Payer: Medicare Other | Admitting: Endocrinology

## 2022-01-31 ENCOUNTER — Other Ambulatory Visit: Payer: Self-pay | Admitting: Endocrinology

## 2022-01-31 DIAGNOSIS — E1151 Type 2 diabetes mellitus with diabetic peripheral angiopathy without gangrene: Secondary | ICD-10-CM

## 2022-02-07 ENCOUNTER — Other Ambulatory Visit: Payer: Self-pay | Admitting: Endocrinology

## 2022-02-13 ENCOUNTER — Ambulatory Visit: Payer: Medicare Other | Admitting: Endocrinology

## 2022-03-07 ENCOUNTER — Emergency Department (HOSPITAL_COMMUNITY): Payer: Medicare Other

## 2022-03-07 ENCOUNTER — Other Ambulatory Visit: Payer: Self-pay

## 2022-03-07 ENCOUNTER — Emergency Department (HOSPITAL_COMMUNITY)
Admission: EM | Admit: 2022-03-07 | Discharge: 2022-03-07 | Disposition: A | Discharge status: Home / Self Care | Payer: Medicare Other | Attending: Emergency Medicine | Admitting: Emergency Medicine

## 2022-03-07 ENCOUNTER — Encounter (HOSPITAL_COMMUNITY): Payer: Self-pay | Admitting: Emergency Medicine

## 2022-03-07 DIAGNOSIS — I10 Essential (primary) hypertension: Secondary | ICD-10-CM | POA: Insufficient documentation

## 2022-03-07 DIAGNOSIS — I2510 Atherosclerotic heart disease of native coronary artery without angina pectoris: Secondary | ICD-10-CM | POA: Diagnosis not present

## 2022-03-07 DIAGNOSIS — R079 Chest pain, unspecified: Secondary | ICD-10-CM

## 2022-03-07 DIAGNOSIS — Z7984 Long term (current) use of oral hypoglycemic drugs: Secondary | ICD-10-CM | POA: Insufficient documentation

## 2022-03-07 DIAGNOSIS — R0789 Other chest pain: Secondary | ICD-10-CM | POA: Diagnosis not present

## 2022-03-07 DIAGNOSIS — J9811 Atelectasis: Secondary | ICD-10-CM | POA: Diagnosis not present

## 2022-03-07 DIAGNOSIS — E119 Type 2 diabetes mellitus without complications: Secondary | ICD-10-CM | POA: Insufficient documentation

## 2022-03-07 DIAGNOSIS — Z7982 Long term (current) use of aspirin: Secondary | ICD-10-CM | POA: Diagnosis not present

## 2022-03-07 LAB — BASIC METABOLIC PANEL
Anion gap: 4 — ABNORMAL LOW (ref 5–15)
BUN: 17 mg/dL (ref 8–23)
CO2: 26 mmol/L (ref 22–32)
Calcium: 9.1 mg/dL (ref 8.9–10.3)
Chloride: 106 mmol/L (ref 98–111)
Creatinine, Ser: 1.01 mg/dL (ref 0.61–1.24)
GFR, Estimated: >60 mL/min (ref >60)
Glucose, Bld: 156 mg/dL — ABNORMAL HIGH (ref 70–99)
Potassium: 4.1 mmol/L (ref 3.5–5.1)
Sodium: 136 mmol/L (ref 135–145)

## 2022-03-07 LAB — CBC
HCT: 45.6 % (ref 39.0–52.0)
Hemoglobin: 15.9 g/dL (ref 13.0–17.0)
MCH: 30.1 pg (ref 26.0–34.0)
MCHC: 34.9 g/dL (ref 30.0–36.0)
MCV: 86.4 fL (ref 80.0–100.0)
Platelets: 134 10*3/uL — ABNORMAL LOW (ref 150–400)
RBC: 5.28 MIL/uL (ref 4.22–5.81)
RDW: 13.5 % (ref 11.5–15.5)
WBC: 5.2 10*3/uL (ref 4.0–10.5)
nRBC: 0 % (ref 0.0–0.2)

## 2022-03-07 LAB — TROPONIN I (HIGH SENSITIVITY)
Troponin I (High Sensitivity): 3 ng/L (ref <18)
Troponin I (High Sensitivity): 3 ng/L (ref <18)

## 2022-03-07 NOTE — ED Triage Notes (Signed)
Patient c/o intermittent left side chest pain that radiates to right side x2 weeks. Denies any shortness of breath, nausea, vomiting, dizziness, or back pain. Per wife patient has cardiac hx of cardiac stent. Patient has taken '81mg'$  of aspirin today. Patient has nitroglycerin but denies taking any during two weeks.

## 2022-03-07 NOTE — ED Provider Notes (Addendum)
[facility] EMERGENCY DEPARTMENT Provider Note   CSN: [id number] Arrival date & time: 03/07/22  1204     History  Chief Complaint  Patient presents with   Chest Pain    [name] [name] is a 66 y.o. male.  HPI  66 year old male with past medical history of CAD status post stent, HTN, HLD, DM presents emergency department intermittent episodes of chest pain.  Patient states for the past 2 weeks has been having intermittent left-sided chest pain that he describes as a tightness.  It last for couple minutes, self resolves.  He is baseline in between episodes.  Happens at least once a day, sometimes multiple times a day.  Denies any shortness of breath, cough, swelling of his lower extremities.  Continues to follow with cardiology as an outpatient, had a stress test in the last 2 years.  No pain radiation to his back/abdomen.  Home Medications Prior to Admission medications   Medication Sig Start Date End Date Taking? Authorizing Provider  aspirin EC 81 MG tablet Take 1 tablet (81 mg total) by mouth daily. 05/03/19   Satira Sark, MD  bromocriptine (PARLODEL) 2.5 MG tablet TAKE ONE-HALF TABLET BY  MOUTH DAILY 02/09/22   Shamleffer, Melanie Crazier, MD  fish oil-omega-3 fatty acids 1000 MG capsule Take 1 capsule by mouth 2 (two) times daily.    [provider]  glucose blood (ONETOUCH VERIO) test strip 1 each by Other route daily. And lancets 1/day 11/23/19   Renato Shin, MD  JARDIANCE 25 MG TABS tablet TAKE 1 TABLET BY MOUTH  DAILY 12/01/21   Renato Shin, MD  metFORMIN (GLUCOPHAGE-XR) 500 MG 24 hr tablet TAKE 2 TABLETS BY MOUTH  DAILY 02/02/22   Renato Shin, MD  Multiple Vitamin (MULTIVITAMIN) tablet Take 1 tablet by mouth daily.    [provider]  nitroGLYCERIN (NITROSTAT) 0.4 MG SL tablet DISSOLVE 1 TABLET UNDER THE TONGUE EVERY 5 MINUTES FOR 3 DOSES AS NEEDED. MAY REPEAT EVERY 5 MINUTES FOR UP TO 3 DOSES. 08/26/21   Satira Sark, MD  pioglitazone (ACTOS) 15  MG tablet TAKE 1 TABLET BY MOUTH  DAILY 12/01/21   Renato Shin, MD  Semaglutide,0.25 or 0.'5MG'$ /DOS, (OZEMPIC, 0.25 OR 0.5 MG/DOSE,) 2 MG/3ML SOPN Inject 0.5 mg into the skin once a week. 12/30/21   Renato Shin, MD  simvastatin (ZOCOR) 40 MG tablet TAKE 1 TABLET BY MOUTH IN  THE EVENING 02/17/21   Satira Sark, MD      Allergies    Patient has no known allergies.    Review of Systems   Review of Systems  Constitutional:  Negative for fatigue and fever.  Respiratory:  Negative for shortness of breath.   Cardiovascular:  Positive for chest pain. Negative for palpitations and leg swelling.  Gastrointestinal:  Negative for abdominal pain, diarrhea and vomiting.  Skin:  Negative for rash.  Neurological:  Negative for headaches.    Physical Exam Updated Vital Signs BP (!) 127/56   Pulse 70   Temp 97.7 F (36.5 C) (Oral)   Resp 14   Ht 6' (1.829 m)   Wt 112 kg   SpO2 95%   BMI 33.50 kg/m  Physical Exam Vitals and nursing note reviewed.  Constitutional:      General: He is not in acute distress.    Appearance: Normal appearance. He is not ill-appearing or diaphoretic.  HENT:     Head: Normocephalic.     Mouth/Throat:     Mouth: Mucous  membranes are moist.  Cardiovascular:     Rate and Rhythm: Normal rate.  Pulmonary:     Effort: Pulmonary effort is normal. No respiratory distress.  Abdominal:     Palpations: Abdomen is soft.     Tenderness: There is no abdominal tenderness.  Musculoskeletal:     Right lower leg: No edema.     Left lower leg: No edema.  Skin:    General: Skin is warm.  Neurological:     Mental Status: He is alert and oriented to person, place, and time. Mental status is at baseline.  Psychiatric:        Mood and Affect: Mood normal.     ED Results / Procedures / Treatments   Labs (all labs ordered are listed, but only abnormal results are displayed) Labs Reviewed  CBC - Abnormal; Notable for the following components:      Result Value    Platelets 134 (*)    All other components within normal limits  BASIC METABOLIC PANEL  TROPONIN I (HIGH SENSITIVITY)    EKG EKG Interpretation  Date/Time:  Saturday March 07 2022 12:14:43 EDT Ventricular Rate:  74 PR Interval:  190 QRS Duration: 74 QT Interval:  364 QTC Calculation: 404 R Axis:   51 Text Interpretation: Normal sinus rhythm Nonspecific T wave abnormality Abnormal ECG No previous ECGs available Similar to previous Confirmed by Lavenia Atlas [phone]) on 03/07/2022 1:23:12 PM  Radiology DG Chest 2 View  Result Date: 03/07/2022 CLINICAL DATA:  Intermittent left-sided chest pain EXAM: CHEST - 2 VIEW COMPARISON:  10/26/2015 FINDINGS: Normal heart size and mediastinal contours. Extensive artifact from EKG leads. Streaky density over the left diaphragm. No edema, effusion, or pneumothorax. IMPRESSION: Mild left-sided atelectasis. Electronically Signed   By: Jorje Guild M.D.   On: 03/07/2022 12:42    Procedures Procedures    Medications Ordered in ED Medications - No data to display  ED Course/ Medical Decision Making/ A&P                           Medical Decision Making Amount and/or Complexity of Data Reviewed Labs: ordered. Radiology: ordered.   66 year old male presents emergency department intermittent chest pain, brief, self resolved, currently absent.  EKG is reassuring with no acute changes.  Chest x-ray shows no concerning findings.  Blood work is very reassuring, 2 negative troponins with no delta.  No return of symptoms while here in the department.  Low suspicion for ACS as well as PE/dissection. Pain seems atypical for ACS. He is well-appearing, baseline.  Plan for outpatient follow-up with his cardiologist.  Patient at this time appears safe and stable for discharge and close outpatient follow up. Discharge plan and strict return to ED precautions discussed, patient verbalizes understanding and agreement.        Final Clinical Impression(s) /  ED Diagnoses Final diagnoses:  None    Rx / DC Orders ED Discharge Orders     None         Lorelle Gibbs, DO 03/08/22 [place], [place], DO 03/08/22 [phone]

## 2022-03-07 NOTE — Discharge Instructions (Signed)
You have been seen and discharged from the emergency department.Your cardiac workup was normal.  Follow-up with your cardiologist for further evaluation and further care. Take home medications as prescribed. If you have any worsening symptoms or further concerns for your health please return to an emergency department for further evaluation.

## 2022-03-09 ENCOUNTER — Telehealth: Payer: Self-pay | Admitting: Cardiology

## 2022-03-09 NOTE — Telephone Encounter (Signed)
Patient called to talk with Dr. [name] or nurse in regards to patient being in the hospital and scheduling an appt

## 2022-03-09 NOTE — Telephone Encounter (Signed)
Wife notified - ED f/u given for Friday, 04/10/22 at 3:30 with Bernerd Pho, [place] in [place] office.

## 2022-03-16 ENCOUNTER — Other Ambulatory Visit: Payer: Self-pay | Admitting: Cardiology

## 2022-03-19 DIAGNOSIS — R079 Chest pain, unspecified: Secondary | ICD-10-CM | POA: Diagnosis not present

## 2022-03-19 DIAGNOSIS — I700 Atherosclerosis of aorta: Secondary | ICD-10-CM | POA: Diagnosis not present

## 2022-03-23 ENCOUNTER — Encounter: Payer: Self-pay | Admitting: Internal Medicine

## 2022-04-07 ENCOUNTER — Telehealth: Payer: Self-pay | Admitting: *Deleted

## 2022-04-07 NOTE — Telephone Encounter (Signed)
Pt. Was seen in the ED recently for chest pain scheduled for recall colon 05/18/22 please review and advise if he needs a OV prior to procedure ?

## 2022-04-07 NOTE — Telephone Encounter (Signed)
He sees [place] PA-C of cardiology 7/14.  [place] - please let us know if you will be doing any testing, etc and if he is cleared to proceed w/ a colonoscopy.  Thanks!  Silvano Rusk

## 2022-04-10 ENCOUNTER — Encounter: Payer: Self-pay | Admitting: Student

## 2022-04-10 ENCOUNTER — Ambulatory Visit (INDEPENDENT_AMBULATORY_CARE_PROVIDER_SITE_OTHER): Payer: Medicare Other | Admitting: Student

## 2022-04-10 VITALS — BP 130/90 | HR 68 | Ht 72.0 in | Wt 249.8 lb

## 2022-04-10 DIAGNOSIS — I25110 Atherosclerotic heart disease of native coronary artery with unstable angina pectoris: Secondary | ICD-10-CM | POA: Diagnosis not present

## 2022-04-10 DIAGNOSIS — Z01810 Encounter for preprocedural cardiovascular examination: Secondary | ICD-10-CM | POA: Diagnosis not present

## 2022-04-10 DIAGNOSIS — I10 Essential (primary) hypertension: Secondary | ICD-10-CM

## 2022-04-10 DIAGNOSIS — E785 Hyperlipidemia, unspecified: Secondary | ICD-10-CM

## 2022-04-10 MED ORDER — METOPROLOL SUCCINATE ER 25 MG PO TB24: 12.5000 mg | ORAL_TABLET | Freq: Every day | ORAL | 11 refills | Status: DC

## 2022-04-10 NOTE — Progress Notes (Unsigned)
Cardiology Office Note    Date:  04/11/2022   ID:  FARID GRIGORIAN, DOB [birth year], MRN [id number]  PCP:  [facility]  Cardiologist: [name], MD    Chief Complaint  Patient presents with   Follow-up    Recent Emergency Dept visit    History of Present Illness:    [name] [name] is a 66 y.o. male with past medical history of CAD (s/p DES to RCA in 2005, low-risk NST's in 2017, 2019 and 09/2020), HLD, Type 2 DM, prior CVA and history of testicular cancer who presents to the office today for follow-up from a recent Emergency Department visit.   He was examined by myself in 12/2021 and reported overall being active at that time and denied any recent anginal symptoms. 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 He was continued on his current medical therapy including ASA 81 mg daily, Simvastatin 40 mg daily and Jardiance 25 mg daily (on this for Type 2 DM).  In the interim, he presented to [facility] ED on 03/07/2022 for evaluation of left-sided chest pain for the past 2 weeks. Reported this pain would last for a few minutes and spontaneously resolve. Hs troponin values were negative at 3 and EKG showed no acute ST changes when compared to prior tracings. He was discharged home and informed to follow-up with Cardiology as an outpatient.  In talking with the patient and his wife today, he reports having worsening episodes of chest pain since ED evaluation. Reports it is a pressure which is along his left pectoral region and radiates into his sternum at times. Symptoms have occurred at rest or with exertion but were certainly more noticeable this past weekend when he was being physically active around his home and trying to build a deck.  Reports his pain typically lasts for several minutes and will spontaneously resolve. He has not had to utilize  nitroglycerin. He has not noticed an association with food consumption or with positional changes. No recent orthopnea, PND or pitting edema. His wife reports that he does not follow a heart healthy diet and consumes red meat and butter routinely.   Past Medical History:  Diagnosis Date   Arthritis    Cholelithiasis    Coronary atherosclerosis of native coronary artery    DES RCA 10/05   Essential hypertension    Hyperlipidemia    OA (osteoarthritis)    Stroke (HCC)    Left pons 6/11 - Aggrenox started   Testicular cancer ([place]) 1978   Type 2 diabetes mellitus ([place])    Urolithiasis     Past Surgical History:  Procedure Laterality Date   Colonoscopy     CORONARY STENT PLACEMENT  2005   Testicular tumor resection     Right side    Current Medications: Outpatient Medications Prior to Visit  Medication Sig Dispense Refill   aspirin EC 81 MG tablet Take 1 tablet (81 mg total) by mouth daily. 90 tablet 3   bromocriptine (PARLODEL) 2.5 MG tablet TAKE ONE-HALF TABLET BY  MOUTH DAILY (Patient taking differently: Take 1.25 mg by mouth daily.) 50 tablet 0   esomeprazole (NEXIUM) 20 MG capsule Take 20 mg by mouth daily as needed (acid reflux).     fish oil-omega-3 fatty acids 1000 MG capsule Take 1 capsule by mouth 2 (two) times daily.  glucose blood (ONETOUCH VERIO) test strip 1 each by Other route daily. And lancets 1/day 100 each 3   JARDIANCE 25 MG TABS tablet TAKE 1 TABLET BY MOUTH  DAILY (Patient taking differently: Take 25 mg by mouth daily.) 90 tablet 3   metFORMIN (GLUCOPHAGE-XR) 500 MG 24 hr tablet TAKE 2 TABLETS BY MOUTH  DAILY (Patient taking differently: Take 1,000 mg by mouth daily with breakfast.) 180 tablet 3   nitroGLYCERIN (NITROSTAT) 0.4 MG SL tablet DISSOLVE 1 TABLET UNDER THE TONGUE EVERY 5 MINUTES FOR 3 DOSES AS NEEDED. MAY REPEAT EVERY 5 MINUTES FOR UP TO 3 DOSES. (Patient taking differently: Place 0.4 mg under the tongue every 5 (five) minutes as needed for chest  pain.) 25 tablet 3   pioglitazone (ACTOS) 15 MG tablet TAKE 1 TABLET BY MOUTH  DAILY (Patient taking differently: Take 15 mg by mouth daily.) 90 tablet 3   Semaglutide,0.25 or 0.'5MG'$ /DOS, (OZEMPIC, 0.25 OR 0.5 MG/DOSE,) 2 MG/3ML SOPN Inject 0.5 mg into the skin once a week. 9 mL 3   simvastatin (ZOCOR) 40 MG tablet TAKE 1 TABLET BY MOUTH IN  THE EVENING 90 tablet 3   No facility-administered medications prior to visit.     Allergies:   Patient has no known allergies.   Social History   Socioeconomic History   Marital status: Married    Spouse name: [name]   Number of children: 0   Years of education: Not on file   Highest education level: Not on file  Occupational History   Occupation: Truck driver  Tobacco Use   Smoking status: Former    Packs/day: 1.50    Years: 43.00    Total pack years: 64.50    Types: Cigarettes    Quit date: 04/21/2010    Years since quitting: 11.9   Smokeless tobacco: Never  Vaping Use   Vaping Use: Never used  Substance and Sexual Activity   Alcohol use: Yes    Comment: occasionally   Drug use: No   Sexual activity: Not on file  Other Topics Concern   Not on file  Social History Narrative   Not on file   Social Determinants of Health   Financial Resource Strain: Not on file  Food Insecurity: Not on file  Transportation Needs: Not on file  Physical Activity: Not on file  Stress: Not on file  Social Connections: Not on file     Family History:  The patient's family history includes Diabetes in his father; Heart attack in his father; Heart disease in his father.   Review of Systems:    Please see the history of present illness.     All other systems reviewed and are otherwise negative except as noted above.   Physical Exam:    VS:  BP 130/90   Pulse 68   Ht 6' (1.829 m)   Wt 249 lb 12.8 oz (113.3 kg)   SpO2 97%   BMI 33.88 kg/m    General: Well developed, well nourished,male appearing in no acute distress. Head: Normocephalic,  atraumatic. Neck: No carotid bruits. JVD not elevated.  Lungs: Respirations regular and unlabored, without wheezes or rales.  Heart: Regular rate and rhythm. No S3 or S4.  No murmur, no rubs, or gallops appreciated. Abdomen: Appears non-distended. No obvious abdominal masses. Msk:  Strength and tone appear normal for age. No obvious joint deformities or effusions. Extremities: No clubbing or cyanosis. No pitting edema.  Distal pedal pulses are 2+ bilaterally. Neuro: Alert and  oriented X 3. Moves all extremities spontaneously. No focal deficits noted. Psych:  Responds to questions appropriately with a normal affect. Skin: No rashes or lesions noted  Wt Readings from Last 3 Encounters:  04/10/22 249 lb 12.8 oz (113.3 kg)  03/07/22 247 lb (112 kg)  01/02/22 251 lb (113.9 kg)     Studies/Labs Reviewed:   EKG:  EKG is not ordered today.  EKG from 03/07/2022 is reviewed and shows NSR, HR 74 with no acute ST changes.   Recent Labs: 07/18/2021: ALT 35; TSH 1.18 03/07/2022: BUN 17; Creatinine, Ser 1.01; Hemoglobin 15.9; Platelets 134; Potassium 4.1; Sodium 136   Lipid Panel    Component Value Date/Time   CHOL 148 07/18/2021 1424   TRIG 142.0 07/18/2021 1424   TRIG 96 07/09/2006 1049   HDL 58.30 07/18/2021 1424   CHOLHDL 3 07/18/2021 1424   VLDL 28.4 07/18/2021 1424   LDLCALC 61 07/18/2021 1424   LDLDIRECT 57.0 04/01/2018 1408    Additional studies/ records that were reviewed today include:   NST: 10/18/2020 No diagnostic ST segment changes to indicate ischemia. No significant myocardial perfusion defects to indicate scar or ischemia. This is a low risk study. Nuclear stress EF: 76%.  Assessment:    1. Coronary artery disease involving native coronary artery of native heart with unstable angina pectoris ([place])   2. Essential hypertension   3. Hyperlipidemia LDL goal <70   4. Preoperative cardiovascular examination      Plan:   In order of problems listed above:  1.  CAD/Chest Pain concerning for Accelerating Angina  - He underwent DES placement to the RCA in 2005 and most recent ischemic evaluation was a low-risk NST in 09/2020. He has developed worsening chest pain since ED evaluation last month and symptoms are occurring more frequently with exertion. Reviewed with Dr. Radford Pax (DOD) and given his worsening symptoms, will plan for a cardiac catheterization for definitive evaluation. Both a stress test and catheterization were reviewed with the patient and his wife today and he wishes to have a 100% definitive diagnosis. The patient understands that risks include but are not limited to stroke (1 in 1000), death (1 in 65), kidney failure [usually temporary] (1 in 500), bleeding (1 in 200), allergic reaction [possibly serious] (1 in 200).   - Continue ASA 81 mg daily, Simvastatin 40 mg daily and Jardiance 25 mg daily (on this for Type 2 DM). Will add Toprol-XL 12.'5mg'$  daily. Obtain updated CBC and BMET.   2. HTN - BP is at 130/90 during today's visit. Will plan to add Toprol-XL 12.5 mg daily as outlined above.  3. HLD - FLP in 06/2021 showed his LDL was down to 61. He remains on Simvastatin 40 mg daily and Fish Oil.  4. Cardiac Clearance for Colonoscopy - He is tentatively scheduled for a colonoscopy next month but given his progressive symptoms as outlined above, would await results from his cardiac catheterization prior to pursuing this. Reports his colonoscopy was for screening purposes and no recent GI issues. Will route today's note to his gastroenterologist (Dr. Carlean Purl).   Medication Adjustments/Labs and Tests Ordered: Current medicines are reviewed at length with the patient today.  Concerns regarding medicines are outlined above.  Medication changes, Labs and Tests ordered today are listed in the Patient Instructions below. Patient Instructions  Medication Instructions:   Hold Jardiance, Metformin and Actos on the day of your procedure.   Start  Toprol XL 12.5 mg Daily   *If you need a  refill on your cardiac medications before your next appointment, please call your pharmacy*   Lab Work: Your physician recommends that you return for lab work on Monday   If you have labs (blood work) drawn today and your tests are completely normal, you will receive your results only by: [facility] (if you have MyChart) OR A paper copy in the mail If you have any lab test that is abnormal or we need to change your treatment, we will call you to review the results.   Testing/Procedures: Your physician has requested that you have a cardiac catheterization. Cardiac catheterization is used to diagnose and/or treat various heart conditions. Doctors may recommend this procedure for a number of different reasons. The most common reason is to evaluate chest pain. Chest pain can be a symptom of coronary artery disease (CAD), and cardiac catheterization can show whether plaque is narrowing or blocking your heart's arteries. This procedure is also used to evaluate the valves, as well as measure the blood flow and oxygen levels in different parts of your heart. For further information please visit [email or web address]. Please follow instruction sheet, as given.    Follow-Up: At [facility], you and your health needs are our priority.  As part of our continuing mission to provide you with exceptional heart care, we have created designated Provider Care Teams.  These Care Teams include your primary Cardiologist (physician) and Advanced Practice Providers (APPs -  Physician Assistants and Nurse Practitioners) who all work together to provide you with the care you need, when you need it.  We recommend signing up for the patient portal called "MyChart".  Sign up information is provided on this After Visit Summary.  MyChart is used to connect with patients for Virtual Visits (Telemedicine).  Patients are able to view lab/test results, encounter notes, upcoming  appointments, etc.  Non-urgent messages can be sent to your provider as well.   To learn more about what you can do with MyChart, go to [email or web address].    Your next appointment:   3 -4 week(s)  The format for your next appointment:   In Person  Provider:   [name], MD or Bernerd Pho, PA-C    Other Instructions Thank you for choosing [facility]!    Important Information About [place] [address] Dept: [phone] Loc: [phone]  [name] [name]  04/10/2022  You are scheduled for a Cardiac Catheterization on Thursday, July 20 with Dr. Peter Martinique.  1. Please arrive at the Main Entrance A at [facility]: [place], [place] [address] at 7:00 AM (This time is two hours before your procedure to ensure your preparation). Free valet parking service is available.   Special note: Every effort is made to have your procedure done on time. Please understand that emergencies sometimes delay scheduled procedures.  2. Diet: Do not eat solid foods after midnight.  You may have clear liquids until 5 AM upon the day of the procedure.  3. Labs: You will need to have blood drawn on Monday, July 17 at [facility] do not need to be fasting.  4. Medication instructions in preparation for your procedure:   Contrast Allergy: No  Hold Metformin, Jardiance and Actos the day of your procedure.   On the morning of your procedure, take Aspirin and any morning medicines NOT listed above.  You  may use sips of water.  5. Plan to go home the same day, you will only stay overnight if medically necessary. 6. You MUST have a responsible adult to drive you home. 7. An adult MUST be with you the first 24 hours after you arrive home. 8. Bring a current list of your medications, and the last time and date medication  taken. 9. Bring ID and current insurance cards. 10.Please wear clothes that are easy to get on and off and wear slip-on shoes.  Thank you for allowing [place] to care for you!   -- [facility]       Signed, Erma Heritage, Hershal Coria  04/11/2022 8:45 PM    [facility] [address] [place], [place] [address] Phone: [phone] Fax: [phone]

## 2022-04-10 NOTE — H&P (View-Only) (Signed)
Cardiology Office Note    Date:  04/11/2022   ID:  [name] [name], DOB [birth year], MRN [id number]  PCP:  [facility]  Cardiologist: [name] Lesches, MD    Chief Complaint  Patient presents with   Follow-up    Recent Emergency Dept visit    History of Present Illness:    [name] [name] is a 66 y.o. male with past medical history of CAD (s/p DES to RCA in 2005, low-risk NST's in 2017, 2019 and 09/2020), HLD, Type 2 DM, prior CVA and history of testicular cancer who presents to the office today for follow-up from a recent Emergency Department visit.   He was examined by myself in 12/2021 and reported overall being active at that time and denied any recent anginal symptoms. 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 He was continued on his current medical therapy including ASA 81 mg daily, Simvastatin 40 mg daily and Jardiance 25 mg daily (on this for Type 2 DM).  In the interim, he presented to [facility] ED on 03/07/2022 for evaluation of left-sided chest pain for the past 2 weeks. Reported this pain would last for a few minutes and spontaneously resolve. Hs troponin values were negative at 3 and EKG showed no acute ST changes when compared to prior tracings. He was discharged home and informed to follow-up with Cardiology as an outpatient.  In talking with the patient and his wife today, he reports having worsening episodes of chest pain since ED evaluation. Reports it is a pressure which is along his left pectoral region and radiates into his sternum at times. Symptoms have occurred at rest or with exertion but were certainly more noticeable this past weekend when he was being physically active around his home and trying to build a deck.  Reports his pain typically lasts for several minutes and will spontaneously resolve. He has not had to utilize  nitroglycerin. He has not noticed an association with food consumption or with positional changes. No recent orthopnea, PND or pitting edema. His wife reports that he does not follow a heart healthy diet and consumes red meat and butter routinely.   Past Medical History:  Diagnosis Date   Arthritis    Cholelithiasis    Coronary atherosclerosis of native coronary artery    DES RCA 10/05   Essential hypertension    Hyperlipidemia    OA (osteoarthritis)    Stroke (HCC)    Left pons 6/11 - Aggrenox started   Testicular cancer ([place]) 1978   Type 2 diabetes mellitus ([place])    Urolithiasis     Past Surgical History:  Procedure Laterality Date   Colonoscopy     CORONARY STENT PLACEMENT  2005   Testicular tumor resection     Right side    Current Medications: Outpatient Medications Prior to Visit  Medication Sig Dispense Refill   aspirin EC 81 MG tablet Take 1 tablet (81 mg total) by mouth daily. 90 tablet 3   bromocriptine (PARLODEL) 2.5 MG tablet TAKE ONE-HALF TABLET BY  MOUTH DAILY (Patient taking differently: Take 1.25 mg by mouth daily.) 50 tablet 0   esomeprazole (NEXIUM) 20 MG capsule Take 20 mg by mouth daily as needed (acid reflux).     fish oil-omega-3 fatty acids 1000 MG capsule Take 1 capsule by mouth 2 (two) times daily.  glucose blood (ONETOUCH VERIO) test strip 1 each by Other route daily. And lancets 1/day 100 each 3   JARDIANCE 25 MG TABS tablet TAKE 1 TABLET BY MOUTH  DAILY (Patient taking differently: Take 25 mg by mouth daily.) 90 tablet 3   metFORMIN (GLUCOPHAGE-XR) 500 MG 24 hr tablet TAKE 2 TABLETS BY MOUTH  DAILY (Patient taking differently: Take 1,000 mg by mouth daily with breakfast.) 180 tablet 3   nitroGLYCERIN (NITROSTAT) 0.4 MG SL tablet DISSOLVE 1 TABLET UNDER THE TONGUE EVERY 5 MINUTES FOR 3 DOSES AS NEEDED. MAY REPEAT EVERY 5 MINUTES FOR UP TO 3 DOSES. (Patient taking differently: Place 0.4 mg under the tongue every 5 (five) minutes as needed for chest  pain.) 25 tablet 3   pioglitazone (ACTOS) 15 MG tablet TAKE 1 TABLET BY MOUTH  DAILY (Patient taking differently: Take 15 mg by mouth daily.) 90 tablet 3   Semaglutide,0.25 or 0.'5MG'$ /DOS, (OZEMPIC, 0.25 OR 0.5 MG/DOSE,) 2 MG/3ML SOPN Inject 0.5 mg into the skin once a week. 9 mL 3   simvastatin (ZOCOR) 40 MG tablet TAKE 1 TABLET BY MOUTH IN  THE EVENING 90 tablet 3   No facility-administered medications prior to visit.     Allergies:   Patient has no known allergies.   Social History   Socioeconomic History   Marital status: Married    Spouse name: [name]   Number of children: 0   Years of education: Not on file   Highest education level: Not on file  Occupational History   Occupation: Truck driver  Tobacco Use   Smoking status: Former    Packs/day: 1.50    Years: 43.00    Total pack years: 64.50    Types: Cigarettes    Quit date: 04/21/2010    Years since quitting: 11.9   Smokeless tobacco: Never  Vaping Use   Vaping Use: Never used  Substance and Sexual Activity   Alcohol use: Yes    Comment: occasionally   Drug use: No   Sexual activity: Not on file  Other Topics Concern   Not on file  Social History Narrative   Not on file   Social Determinants of Health   Financial Resource Strain: Not on file  Food Insecurity: Not on file  Transportation Needs: Not on file  Physical Activity: Not on file  Stress: Not on file  Social Connections: Not on file     Family History:  The patient's family history includes Diabetes in his father; Heart attack in his father; Heart disease in his father.   Review of Systems:    Please see the history of present illness.     All other systems reviewed and are otherwise negative except as noted above.   Physical Exam:    VS:  BP 130/90   Pulse 68   Ht 6' (1.829 m)   Wt 249 lb 12.8 oz (113.3 kg)   SpO2 97%   BMI 33.88 kg/m    General: Well developed, well nourished,male appearing in no acute distress. Head: Normocephalic,  atraumatic. Neck: No carotid bruits. JVD not elevated.  Lungs: Respirations regular and unlabored, without wheezes or rales.  Heart: Regular rate and rhythm. No S3 or S4.  No murmur, no rubs, or gallops appreciated. Abdomen: Appears non-distended. No obvious abdominal masses. Msk:  Strength and tone appear normal for age. No obvious joint deformities or effusions. Extremities: No clubbing or cyanosis. No pitting edema.  Distal pedal pulses are 2+ bilaterally. Neuro: Alert and  oriented X 3. Moves all extremities spontaneously. No focal deficits noted. Psych:  Responds to questions appropriately with a normal affect. Skin: No rashes or lesions noted  Wt Readings from Last 3 Encounters:  04/10/22 249 lb 12.8 oz (113.3 kg)  03/07/22 247 lb (112 kg)  01/02/22 251 lb (113.9 kg)     Studies/Labs Reviewed:   EKG:  EKG is not ordered today.  EKG from 03/07/2022 is reviewed and shows NSR, HR 74 with no acute ST changes.   Recent Labs: 07/18/2021: ALT 35; TSH 1.18 03/07/2022: BUN 17; Creatinine, Ser 1.01; Hemoglobin 15.9; Platelets 134; Potassium 4.1; Sodium 136   Lipid Panel    Component Value Date/Time   CHOL 148 07/18/2021 1424   TRIG 142.0 07/18/2021 1424   TRIG 96 07/09/2006 1049   HDL 58.30 07/18/2021 1424   CHOLHDL 3 07/18/2021 1424   VLDL 28.4 07/18/2021 1424   LDLCALC 61 07/18/2021 1424   LDLDIRECT 57.0 04/01/2018 1408    Additional studies/ records that were reviewed today include:   NST: 10/18/2020 No diagnostic ST segment changes to indicate ischemia. No significant myocardial perfusion defects to indicate scar or ischemia. This is a low risk study. Nuclear stress EF: 76%.  Assessment:    1. Coronary artery disease involving native coronary artery of native heart with unstable angina pectoris ([place])   2. Essential hypertension   3. Hyperlipidemia LDL goal <70   4. Preoperative cardiovascular examination      Plan:   In order of problems listed above:  1.  CAD/Chest Pain concerning for Accelerating Angina  - He underwent DES placement to the RCA in 2005 and most recent ischemic evaluation was a low-risk NST in 09/2020. He has developed worsening chest pain since ED evaluation last month and symptoms are occurring more frequently with exertion. Reviewed with Dr. Radford Pax (DOD) and given his worsening symptoms, will plan for a cardiac catheterization for definitive evaluation. Both a stress test and catheterization were reviewed with the patient and his wife today and he wishes to have a 100% definitive diagnosis. The patient understands that risks include but are not limited to stroke (1 in 1000), death (1 in 16), kidney failure [usually temporary] (1 in 500), bleeding (1 in 200), allergic reaction [possibly serious] (1 in 200).   - Continue ASA 81 mg daily, Simvastatin 40 mg daily and Jardiance 25 mg daily (on this for Type 2 DM). Will add Toprol-XL 12.'5mg'$  daily. Obtain updated CBC and BMET.   2. HTN - BP is at 130/90 during today's visit. Will plan to add Toprol-XL 12.5 mg daily as outlined above.  3. HLD - FLP in 06/2021 showed his LDL was down to 61. He remains on Simvastatin 40 mg daily and Fish Oil.  4. Cardiac Clearance for Colonoscopy - He is tentatively scheduled for a colonoscopy next month but given his progressive symptoms as outlined above, would await results from his cardiac catheterization prior to pursuing this. Reports his colonoscopy was for screening purposes and no recent GI issues. Will route today's note to his gastroenterologist (Dr. Carlean Purl).   Medication Adjustments/Labs and Tests Ordered: Current medicines are reviewed at length with the patient today.  Concerns regarding medicines are outlined above.  Medication changes, Labs and Tests ordered today are listed in the Patient Instructions below. Patient Instructions  Medication Instructions:   Hold Jardiance, Metformin and Actos on the day of your procedure.   Start  Toprol XL 12.5 mg Daily   *If you need a  refill on your cardiac medications before your next appointment, please call your pharmacy*   Lab Work: Your physician recommends that you return for lab work on Monday   If you have labs (blood work) drawn today and your tests are completely normal, you will receive your results only by: [place] (if you have MyChart) OR A paper copy in the mail If you have any lab test that is abnormal or we need to change your treatment, we will call you to review the results.   Testing/Procedures: Your physician has requested that you have a cardiac catheterization. Cardiac catheterization is used to diagnose and/or treat various heart conditions. Doctors may recommend this procedure for a number of different reasons. The most common reason is to evaluate chest pain. Chest pain can be a symptom of coronary artery disease (CAD), and cardiac catheterization can show whether plaque is narrowing or blocking your heart's arteries. This procedure is also used to evaluate the valves, as well as measure the blood flow and oxygen levels in different parts of your heart. For further information please visit HugeFiesta.[email or web address]. Please follow instruction sheet, as given.    Follow-Up: At [facility], you and your health needs are our priority.  As part of our continuing mission to provide you with exceptional heart care, we have created designated Provider Care Teams.  These Care Teams include your primary Cardiologist (physician) and Advanced Practice Providers (APPs -  Physician Assistants and Nurse Practitioners) who all work together to provide you with the care you need, when you need it.  We recommend signing up for the patient portal called "MyChart".  Sign up information is provided on this After Visit Summary.  MyChart is used to connect with patients for Virtual Visits (Telemedicine).  Patients are able to view lab/test results, encounter notes, upcoming  appointments, etc.  Non-urgent messages can be sent to your provider as well.   To learn more about what you can do with MyChart, go to [email or web address].    Your next appointment:   3 -4 week(s)  The format for your next appointment:   In Person  Provider:   [name], MD or Bernerd Pho, PA-C    Other Instructions Thank you for choosing [facility]!    Important Information About [place] [address] Dept: [phone] Loc: [phone]  CAMARI WISHAM  04/10/2022  You are scheduled for a Cardiac Catheterization on Thursday, July 20 with Dr. Peter Martinique.  1. Please arrive at the Main Entrance A at [facility]: [place], [place] [address] at 7:00 AM (This time is two hours before your procedure to ensure your preparation). Free valet parking service is available.   Special note: Every effort is made to have your procedure done on time. Please understand that emergencies sometimes delay scheduled procedures.  2. Diet: Do not eat solid foods after midnight.  You may have clear liquids until 5 AM upon the day of the procedure.  3. Labs: You will need to have blood drawn on Monday, July 17 at [place] do not need to be fasting.  4. Medication instructions in preparation for your procedure:   Contrast Allergy: No  Hold Metformin, Jardiance and Actos the day of your procedure.   On the morning of your procedure, take Aspirin and any morning medicines NOT listed above.  You  may use sips of water.  5. Plan to go home the same day, you will only stay overnight if medically necessary. 6. You MUST have a responsible adult to drive you home. 7. An adult MUST be with you the first 24 hours after you arrive home. 8. Bring a current list of your medications, and the last time and date medication  taken. 9. Bring ID and current insurance cards. 10.Please wear clothes that are easy to get on and off and wear slip-on shoes.  Thank you for allowing [place] to care for you!   -- [facility]       Signed, Erma Heritage, Hershal Coria  04/11/2022 8:45 PM    [facility] [address] [place], [place] [address] Phone: [phone] Fax: [phone]

## 2022-04-10 NOTE — Patient Instructions (Addendum)
Medication Instructions:   Hold Jardiance, Metformin and Actos on the day of your procedure.   Start Toprol XL 12.5 mg Daily   *If you need a refill on your cardiac medications before your next appointment, please call your pharmacy*   Lab Work: Your physician recommends that you return for lab work on Monday   If you have labs (blood work) drawn today and your tests are completely normal, you will receive your results only by: [place] (if you have MyChart) OR A paper copy in the mail If you have any lab test that is abnormal or we need to change your treatment, we will call you to review the results.   Testing/Procedures: Your physician has requested that you have a cardiac catheterization. Cardiac catheterization is used to diagnose and/or treat various heart conditions. Doctors may recommend this procedure for a number of different reasons. The most common reason is to evaluate chest pain. Chest pain can be a symptom of coronary artery disease (CAD), and cardiac catheterization can show whether plaque is narrowing or blocking your heart's arteries. This procedure is also used to evaluate the valves, as well as measure the blood flow and oxygen levels in different parts of your heart. For further information please visit HugeFiesta.[email or web address]. Please follow instruction sheet, as given.    Follow-Up: At [facility], you and your health needs are our priority.  As part of our continuing mission to provide you with exceptional heart care, we have created designated Provider Care Teams.  These Care Teams include your primary Cardiologist (physician) and Advanced Practice Providers (APPs -  Physician Assistants and Nurse Practitioners) who all work together to provide you with the care you need, when you need it.  We recommend signing up for the patient portal called "MyChart".  Sign up information is provided on this After Visit Summary.  MyChart is used to connect with patients for  Virtual Visits (Telemedicine).  Patients are able to view lab/test results, encounter notes, upcoming appointments, etc.  Non-urgent messages can be sent to your provider as well.   To learn more about what you can do with MyChart, go to [email or web address].    Your next appointment:   3 -4 week(s)  The format for your next appointment:   In Person  Provider:   [name], MD or Bernerd Pho, PA-C    Other Instructions Thank you for choosing [facility]!    Important Information About [facility] [address] Dept: [phone] Loc: [phone]  [name] [name]  04/10/2022  You are scheduled for a Cardiac Catheterization on Thursday, July 20 with Dr. Peter Martinique.  1. Please arrive at the Main Entrance A at [facility]: [place], [place] [address] at 7:00 AM (This time is two hours before your procedure to ensure your preparation). Free valet parking service is available.   Special note: Every effort is made to have your procedure done on time. Please understand that emergencies sometimes delay scheduled procedures.  2. Diet: Do not eat solid foods after midnight.  You may have clear liquids until 5 AM upon the day of the procedure.  3. Labs: You will need to have blood drawn on Monday, July 17 at [place] do not need to be fasting.  4. Medication instructions in preparation for your procedure:   Contrast Allergy: No  Hold Metformin, Jardiance and Actos the day of your procedure.   On the morning of your procedure, take Aspirin and any morning medicines NOT listed above.  You may use sips of water.  5. Plan to go home the same day, you will only stay overnight if medically necessary. 6. You MUST have a responsible adult to drive you home. 7. An adult MUST be with you the first 24 hours after you  arrive home. 8. Bring a current list of your medications, and the last time and date medication taken. 9. Bring ID and current insurance cards. 10.Please wear clothes that are easy to get on and off and wear slip-on shoes.  Thank you for allowing [place] to care for you!   -- [facility]

## 2022-04-11 ENCOUNTER — Encounter: Payer: Self-pay | Admitting: Student

## 2022-04-13 ENCOUNTER — Other Ambulatory Visit (HOSPITAL_COMMUNITY)
Admission: RE | Admit: 2022-04-13 | Discharge: 2022-04-13 | Disposition: A | Discharge status: Home / Self Care | Payer: Medicare Other | Source: Ambulatory Visit | Attending: Student | Admitting: Student

## 2022-04-13 DIAGNOSIS — I25110 Atherosclerotic heart disease of native coronary artery with unstable angina pectoris: Secondary | ICD-10-CM | POA: Diagnosis not present

## 2022-04-13 LAB — CBC
HCT: 48.1 % (ref 39.0–52.0)
Hemoglobin: 16.4 g/dL (ref 13.0–17.0)
MCH: 30 pg (ref 26.0–34.0)
MCHC: 34.1 g/dL (ref 30.0–36.0)
MCV: 87.9 fL (ref 80.0–100.0)
Platelets: 127 10*3/uL — ABNORMAL LOW (ref 150–400)
RBC: 5.47 MIL/uL (ref 4.22–5.81)
RDW: 13.7 % (ref 11.5–15.5)
WBC: 6.2 10*3/uL (ref 4.0–10.5)
nRBC: 0 % (ref 0.0–0.2)

## 2022-04-13 LAB — BASIC METABOLIC PANEL
Anion gap: 5 (ref 5–15)
BUN: 19 mg/dL (ref 8–23)
CO2: 26 mmol/L (ref 22–32)
Calcium: 9.3 mg/dL (ref 8.9–10.3)
Chloride: 105 mmol/L (ref 98–111)
Creatinine, Ser: 1.18 mg/dL (ref 0.61–1.24)
GFR, Estimated: >60 mL/min (ref >60)
Glucose, Bld: 228 mg/dL — ABNORMAL HIGH (ref 70–99)
Potassium: 4.2 mmol/L (ref 3.5–5.1)
Sodium: 136 mmol/L (ref 135–145)

## 2022-04-14 ENCOUNTER — Telehealth: Payer: Self-pay | Admitting: *Deleted

## 2022-04-14 NOTE — Telephone Encounter (Signed)
Cardiac Catheterization scheduled at [facility] for: Thursday April 16, 2022 9 AM  Arrival time and place: [facility] Main Entrance A at: 7 AM  Nothing to eat after midnight prior to procedure, clear liquids until 5 AM day of procedure.  Medication instructions: -Hold:  Metformin-day of procedure and 48 hours post procedure  Actos-AM of procedure   Jardiance-AM of procedure -Except hold medications usual morning medications can be taken with sips of water including aspirin 81 mg.  Patient takes Ozempic weekly on Wednesdays.  Confirmed patient has responsible adult to drive home post procedure and be with patient first 24 hours after arriving home.  Patient reports no new symptoms concerning for COVID-19 in the past 10 days.  Reviewed procedure instructions with patient.

## 2022-04-16 ENCOUNTER — Ambulatory Visit (HOSPITAL_COMMUNITY)
Admission: RE | Admit: 2022-04-16 | Discharge: 2022-04-16 | Disposition: A | Discharge status: Home / Self Care | Payer: Medicare Other | Attending: Cardiology | Admitting: Cardiology

## 2022-04-16 ENCOUNTER — Encounter (HOSPITAL_COMMUNITY): Payer: Self-pay | Admitting: Cardiology

## 2022-04-16 ENCOUNTER — Encounter (HOSPITAL_COMMUNITY)
Admission: RE | Disposition: A | Discharge status: Home / Self Care | Payer: Self-pay | Source: Home / Self Care | Attending: Cardiology

## 2022-04-16 ENCOUNTER — Other Ambulatory Visit: Payer: Self-pay

## 2022-04-16 ENCOUNTER — Other Ambulatory Visit (HOSPITAL_COMMUNITY): Payer: Self-pay

## 2022-04-16 DIAGNOSIS — Z87891 Personal history of nicotine dependence: Secondary | ICD-10-CM | POA: Diagnosis not present

## 2022-04-16 DIAGNOSIS — E785 Hyperlipidemia, unspecified: Secondary | ICD-10-CM | POA: Diagnosis not present

## 2022-04-16 DIAGNOSIS — Z955 Presence of coronary angioplasty implant and graft: Secondary | ICD-10-CM | POA: Insufficient documentation

## 2022-04-16 DIAGNOSIS — Z8673 Personal history of transient ischemic attack (TIA), and cerebral infarction without residual deficits: Secondary | ICD-10-CM | POA: Insufficient documentation

## 2022-04-16 DIAGNOSIS — Z7985 Long-term (current) use of injectable non-insulin antidiabetic drugs: Secondary | ICD-10-CM | POA: Insufficient documentation

## 2022-04-16 DIAGNOSIS — Z7982 Long term (current) use of aspirin: Secondary | ICD-10-CM | POA: Diagnosis not present

## 2022-04-16 DIAGNOSIS — I10 Essential (primary) hypertension: Secondary | ICD-10-CM | POA: Diagnosis not present

## 2022-04-16 DIAGNOSIS — Z79899 Other long term (current) drug therapy: Secondary | ICD-10-CM | POA: Diagnosis not present

## 2022-04-16 DIAGNOSIS — Z7984 Long term (current) use of oral hypoglycemic drugs: Secondary | ICD-10-CM | POA: Diagnosis not present

## 2022-04-16 DIAGNOSIS — I25118 Atherosclerotic heart disease of native coronary artery with other forms of angina pectoris: Secondary | ICD-10-CM | POA: Insufficient documentation

## 2022-04-16 DIAGNOSIS — E119 Type 2 diabetes mellitus without complications: Secondary | ICD-10-CM | POA: Insufficient documentation

## 2022-04-16 HISTORY — PX: LEFT HEART CATH AND CORONARY ANGIOGRAPHY: CATH118249

## 2022-04-16 HISTORY — PX: CORONARY STENT INTERVENTION: CATH118234

## 2022-04-16 LAB — GLUCOSE, CAPILLARY
Glucose-Capillary: 136 mg/dL — ABNORMAL HIGH (ref 70–99)
Glucose-Capillary: 144 mg/dL — ABNORMAL HIGH (ref 70–99)

## 2022-04-16 LAB — POCT ACTIVATED CLOTTING TIME: Activated Clotting Time: 342 seconds

## 2022-04-16 SURGERY — LEFT HEART CATH AND CORONARY ANGIOGRAPHY
Anesthesia: LOCAL

## 2022-04-16 MED ORDER — SODIUM CHLORIDE 0.9% FLUSH: 3.0000 mL | INTRAVENOUS | Status: DC | PRN

## 2022-04-16 MED ORDER — VERAPAMIL HCL 2.5 MG/ML IV SOLN
INTRAVENOUS | Status: DC | PRN
  Administered 2022-04-16: 10 mL via INTRA_ARTERIAL

## 2022-04-16 MED ORDER — FENTANYL CITRATE (PF) 100 MCG/2ML IJ SOLN
INTRAMUSCULAR | Status: DC | PRN
  Administered 2022-04-16: 25 ug via INTRAVENOUS

## 2022-04-16 MED ORDER — SODIUM CHLORIDE 0.9 % WEIGHT BASED INFUSION: 1.0000 mL/kg/h | INTRAVENOUS | Status: DC

## 2022-04-16 MED ORDER — SODIUM CHLORIDE 0.9% FLUSH: 3.0000 mL | Freq: Two times a day (BID) | INTRAVENOUS | Status: DC

## 2022-04-16 MED ORDER — SODIUM CHLORIDE 0.9 % IV SOLN: 250.0000 mL | INTRAVENOUS | Status: DC | PRN

## 2022-04-16 MED ORDER — NITROGLYCERIN 1 MG/10 ML FOR IR/CATH LAB
INTRA_ARTERIAL | Status: AC
  Filled 2022-04-16: qty 10

## 2022-04-16 MED ORDER — CLOPIDOGREL BISULFATE 300 MG PO TABS
ORAL_TABLET | ORAL | Status: AC
  Filled 2022-04-16: qty 1

## 2022-04-16 MED ORDER — LIDOCAINE HCL (PF) 1 % IJ SOLN
INTRAMUSCULAR | Status: DC | PRN
  Administered 2022-04-16: 2 mL

## 2022-04-16 MED ORDER — CLOPIDOGREL BISULFATE 75 MG PO TABS
75.0000 mg | ORAL_TABLET | Freq: Every day | ORAL | 1 refills | Status: DC
  Filled 2022-04-16: qty 90, 90d supply, fill #0

## 2022-04-16 MED ORDER — PANTOPRAZOLE SODIUM 40 MG PO TBEC
40.0000 mg | DELAYED_RELEASE_TABLET | Freq: Every day | ORAL | 1 refills | Status: DC
  Filled 2022-04-16: qty 30, 30d supply, fill #0

## 2022-04-16 MED ORDER — HEPARIN (PORCINE) IN NACL 1000-0.9 UT/500ML-% IV SOLN
INTRAVENOUS | Status: DC | PRN
  Administered 2022-04-16 (×2): 500 mL

## 2022-04-16 MED ORDER — ACETAMINOPHEN 325 MG PO TABS: 650.0000 mg | ORAL_TABLET | ORAL | Status: DC | PRN

## 2022-04-16 MED ORDER — NITROGLYCERIN 1 MG/10 ML FOR IR/CATH LAB
INTRA_ARTERIAL | Status: DC | PRN
  Administered 2022-04-16: 200 ug via INTRACORONARY

## 2022-04-16 MED ORDER — CLOPIDOGREL BISULFATE 300 MG PO TABS
ORAL_TABLET | ORAL | Status: DC | PRN
  Administered 2022-04-16: 600 mg via ORAL

## 2022-04-16 MED ORDER — HEPARIN (PORCINE) IN NACL 1000-0.9 UT/500ML-% IV SOLN
INTRAVENOUS | Status: AC
  Filled 2022-04-16: qty 1000

## 2022-04-16 MED ORDER — HEPARIN SODIUM (PORCINE) 1000 UNIT/ML IJ SOLN
INTRAMUSCULAR | Status: DC | PRN
  Administered 2022-04-16 (×2): 5500 [IU] via INTRAVENOUS

## 2022-04-16 MED ORDER — ONDANSETRON HCL 4 MG/2ML IJ SOLN: 4.0000 mg | Freq: Four times a day (QID) | INTRAMUSCULAR | Status: DC | PRN

## 2022-04-16 MED ORDER — SODIUM CHLORIDE 0.9 % WEIGHT BASED INFUSION
3.0000 mL/kg/h | INTRAVENOUS | Status: AC
  Administered 2022-04-16: 3 mL/kg/h via INTRAVENOUS

## 2022-04-16 MED ORDER — FAMOTIDINE IN NACL 20-0.9 MG/50ML-% IV SOLN
INTRAVENOUS | Status: AC
  Filled 2022-04-16: qty 50

## 2022-04-16 MED ORDER — CLOPIDOGREL BISULFATE 75 MG PO TABS: 75.0000 mg | ORAL_TABLET | Freq: Every day | ORAL | Status: DC

## 2022-04-16 MED ORDER — VERAPAMIL HCL 2.5 MG/ML IV SOLN
INTRAVENOUS | Status: AC
  Filled 2022-04-16: qty 2

## 2022-04-16 MED ORDER — LIDOCAINE HCL (PF) 1 % IJ SOLN
INTRAMUSCULAR | Status: AC
  Filled 2022-04-16: qty 30

## 2022-04-16 MED ORDER — HEPARIN SODIUM (PORCINE) 1000 UNIT/ML IJ SOLN
INTRAMUSCULAR | Status: AC
  Filled 2022-04-16: qty 10

## 2022-04-16 MED ORDER — MIDAZOLAM HCL 2 MG/2ML IJ SOLN
INTRAMUSCULAR | Status: DC | PRN
  Administered 2022-04-16: 2 mg via INTRAVENOUS

## 2022-04-16 MED ORDER — MIDAZOLAM HCL 2 MG/2ML IJ SOLN
INTRAMUSCULAR | Status: AC
  Filled 2022-04-16: qty 2

## 2022-04-16 MED ORDER — SODIUM CHLORIDE 0.9 % WEIGHT BASED INFUSION
1.0000 mL/kg/h | INTRAVENOUS | Status: DC
Start: 2022-04-16 — End: 2022-04-16

## 2022-04-16 MED ORDER — FENTANYL CITRATE (PF) 100 MCG/2ML IJ SOLN
INTRAMUSCULAR | Status: AC
  Filled 2022-04-16: qty 2

## 2022-04-16 MED ORDER — FAMOTIDINE IN NACL 20-0.9 MG/50ML-% IV SOLN
INTRAVENOUS | Status: DC | PRN
  Administered 2022-04-16: 20 mg via INTRAVENOUS

## 2022-04-16 MED ORDER — IOHEXOL 350 MG/ML SOLN
INTRAVENOUS | Status: DC | PRN
  Administered 2022-04-16: 95 mL

## 2022-04-16 SURGICAL SUPPLY — 22 items
BALL SAPPHIRE NC24 4.0X10 (BALLOONS) ×2
BALLN SAPPHIRE 2.5X12 (BALLOONS) ×2
BALLOON SAPPHIRE 2.5X12 (BALLOONS) IMPLANT
BALLOON SAPPHIRE NC24 4.0X10 (BALLOONS) IMPLANT
BAND CMPR LRG ZPHR (HEMOSTASIS) ×1
BAND ZEPHYR COMPRESS 30 LONG (HEMOSTASIS) ×1 IMPLANT
CATH INFINITI 5FR MULTPACK ANG (CATHETERS) ×1 IMPLANT
CATH INFINITI JR4 5F (CATHETERS) ×1 IMPLANT
CATH VISTA GUIDE 6FR JR4 (CATHETERS) ×1 IMPLANT
ELECT DEFIB PAD ADLT CADENCE (PAD) ×1 IMPLANT
GLIDESHEATH SLEND SS 6F .021 (SHEATH) ×1 IMPLANT
GUIDEWIRE INQWIRE 1.5J.035X260 (WIRE) IMPLANT
INQWIRE 1.5J .035X260CM (WIRE) ×2
KIT ENCORE 26 ADVANTAGE (KITS) ×1 IMPLANT
KIT ESSENTIALS PG (KITS) ×1 IMPLANT
KIT HEART LEFT (KITS) ×2 IMPLANT
PACK CARDIAC CATHETERIZATION (CUSTOM PROCEDURE TRAY) ×2 IMPLANT
STENT SYNERGY XD 4.0X12 (Permanent Stent) IMPLANT
SYNERGY XD 4.0X12 (Permanent Stent) ×2 IMPLANT
TRANSDUCER W/STOPCOCK (MISCELLANEOUS) ×2 IMPLANT
TUBING CIL FLEX 10 FLL-RA (TUBING) ×2 IMPLANT
WIRE ASAHI PROWATER 180CM (WIRE) ×1 IMPLANT

## 2022-04-16 NOTE — Discharge Instructions (Signed)

## 2022-04-16 NOTE — Interval H&P Note (Signed)
History and Physical Interval Note:  04/16/2022 8:38 AM  [name] [name]  has presented today for surgery, with the diagnosis of unstable angina.  The various methods of treatment have been discussed with the patient and family. After consideration of risks, benefits and other options for treatment, the patient has consented to  Procedure(s): LEFT HEART CATH AND CORONARY ANGIOGRAPHY (N/A) as a surgical intervention.  The patient's history has been reviewed, patient examined, no change in status, stable for surgery.  I have reviewed the patient's chart and labs.  Questions were answered to the patient's satisfaction.   Cath Lab Visit (complete for each Cath Lab visit)  Clinical Evaluation Leading to the Procedure:   ACS: No.  Non-ACS:    Anginal Classification: CCS III  Anti-ischemic medical therapy: Minimal Therapy (1 class of medications)  Non-Invasive Test Results: No non-invasive testing performed  Prior CABG: No previous CABG        [name] [name] [facility] 04/16/2022 8:38 AM

## 2022-04-16 NOTE — Progress Notes (Signed)
Patient and wife was given discharge instructions. Both verbalized understanding. 

## 2022-04-16 NOTE — Discharge Summary (Signed)
Discharge Summary for Same Day PCI   Patient ID: [name] [name] MRN: [id number]; DOB: November 24, 1955  Admit date: 04/16/2022 Discharge date: 04/16/2022  Primary Care Provider: [facility]  Primary Cardiologist: [name], MD  Primary Electrophysiologist:  None   Discharge Diagnoses    Active Problems:   Coronary artery disease of native artery of native heart with stable angina pectoris [facility])    Diagnostic Studies/Procedures    Cardiac Catheterization 04/16/2022:    Prox RCA to Mid RCA lesion is 90% stenosed.   Mid RCA lesion is 25% stenosed.   Previously placed Dist RCA stent of unknown type is  widely patent.   A drug-eluting stent was successfully placed using a SYNERGY XD 4.0X12.   Post intervention, there is a 0% residual stenosis.   The left ventricular systolic function is normal.   LV end diastolic pressure is normal.   The left ventricular ejection fraction is 55-65% by visual estimate.   Single vessel obstructive CAD involving the mid RCA. Old stent in the distal RCA is patent. Normal LV function Normal LVEDP Successful PCI of the mid RCA with DES   Plan: DAPT for 6 months. Anticipate same day DC.  Diagnostic Dominance: Right  Intervention   _____________   History of Present Illness     [name] [name] is a 66 y.o. male with past medical history of CAD (s/p DES to RCA in 2005, low-risk NST's in 2017, 2019 and 09/2020), HLD, Type 2 DM, prior CVA and history of testicular cancer who presented to the office 7/14 for follow-up from a recent Emergency Department visit.    He was examined by Bernerd Pho, [place] on 12/2021 and reported overall being active at that time and denied any recent anginal symptoms. 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 He was continued on his current medical therapy including ASA 81 mg daily,  Simvastatin 40 mg daily and Jardiance 25 mg daily (on this for Type 2 DM).   In the interim, he presented to [facility] ED on 03/07/2022 for evaluation of left-sided chest pain for the past 2 weeks. Reported this pain would last for a few minutes and spontaneously resolve. Hs troponin values were negative at 3 and EKG showed no acute ST changes when compared to prior tracings. He was discharged home and informed to follow-up with Cardiology as an outpatient.   At follow up visit on 7/14, he reported having worsening episodes of chest pain since ED evaluation. Reported it was a pressure which is along his left pectoral region and radiates into his sternum at times. Symptoms have occurred at rest or with exertion but were certainly more noticeable over the weekend when he was being physically active around his home and trying to build a deck.  Reported his pain typically lasted for several minutes and will spontaneously resolve. He has not had to utilize nitroglycerin. He had not noticed an association with food consumption or with positional changes. No recent orthopnea, PND or pitting edema. His wife reported that he did not follow a heart healthy diet and consumes red meat and butter routinely.   Cardiac catheterization was arranged for further evaluation.  Hospital Course     The patient underwent cardiac cath as noted above with single-vessel obstructive CAD involving the mid RCA.  Prior distal RCA stent patent.  New 90% proximal/mid RCA  stenosis treated with PCI/DES x1. Plan for DAPT with ASA/Plavix for at least 6 months. The patient was seen by cardiac rehab while in short stay. There were no observed complications post cath. Radial cath site was re-evaluated prior to discharge and found to be stable without any complications. Instructions/precautions regarding cath site care were given prior to discharge.  [name] [name] was seen by Dr. Martinique and determined stable for discharge home. Follow up  with our office has been arranged. Medications are listed below. Pertinent changes include addition of plavix and protonix.  _____________  Cath/PCI Registry Performance & Quality Measures: Aspirin prescribed? - Yes ADP Receptor Inhibitor (Plavix/Clopidogrel, Brilinta/Ticagrelor or Effient/Prasugrel) prescribed (includes medically managed patients)? - Yes High Intensity Statin (Lipitor 40-'80mg'$  or Crestor 20-'40mg'$ ) prescribed? - No - tolerating Zocor '40mg'$  daily, consider transition to different statin at outpatient follow up For EF <40%, was ACEI/ARB prescribed? - Not Applicable (EF >/= 16%) For EF <40%, Aldosterone Antagonist (Spironolactone or Eplerenone) prescribed? - Not Applicable (EF >/= 10%) Cardiac Rehab Phase II ordered (Included Medically managed Patients)? - Yes  _____________   Discharge Vitals Blood pressure 117/63, pulse 65, temperature 97.8 F (36.6 C), temperature source Oral, resp. rate 20, height 6' (1.829 m), weight 112 kg, SpO2 99 %.  Filed Weights   04/16/22 0732  Weight: 112 kg    Last Labs & Radiologic Studies    CBC Recent Labs    04/13/22 1317  WBC 6.2  HGB 16.4  HCT 48.1  MCV 87.9  PLT 960*   Basic Metabolic Panel Recent Labs    04/13/22 1317  NA 136  K 4.2  CL 105  CO2 26  GLUCOSE 228*  BUN 19  CREATININE 1.18  CALCIUM 9.3   Liver Function Tests No results for input(s): "AST", "ALT", "ALKPHOS", "BILITOT", "PROT", "ALBUMIN" in the last 72 hours. No results for input(s): "LIPASE", "AMYLASE" in the last 72 hours. High Sensitivity Troponin:   No results for input(s): "TROPONINIHS" in the last 720 hours.  BNP Invalid input(s): "POCBNP" D-Dimer No results for input(s): "DDIMER" in the last 72 hours. Hemoglobin A1C No results for input(s): "HGBA1C" in the last 72 hours. Fasting Lipid Panel No results for input(s): "CHOL", "HDL", "LDLCALC", "TRIG", "CHOLHDL", "LDLDIRECT" in the last 72 hours. Thyroid Function Tests No results for  input(s): "TSH", "T4TOTAL", "T3FREE", "THYROIDAB" in the last 72 hours.  Invalid input(s): "FREET3" _____________  CARDIAC CATHETERIZATION  Result Date: 04/16/2022   Prox RCA to Mid RCA lesion is 90% stenosed.   Mid RCA lesion is 25% stenosed.   Previously placed Dist RCA stent of unknown type is  widely patent.   A drug-eluting stent was successfully placed using a SYNERGY XD 4.0X12.   Post intervention, there is a 0% residual stenosis.   The left ventricular systolic function is normal.   LV end diastolic pressure is normal.   The left ventricular ejection fraction is 55-65% by visual estimate. Single vessel obstructive CAD involving the mid RCA. Old stent in the distal RCA is patent. Normal LV function Normal LVEDP Successful PCI of the mid RCA with DES Plan: DAPT for 6 months. Anticipate same day DC.    Disposition   Pt is being discharged home today in good condition.  Follow-up Plans & Appointments     Follow-up Information     Erma Heritage, PA-C Follow up on 05/01/2022.   Specialties: Physician Assistant, Cardiology Why: at 3:30pm for your follow up appt. Contact information: [place]  [place] [phone]                   Discharge Medications   Allergies as of 04/16/2022   No Known Allergies      Medication List     STOP taking these medications    esomeprazole 20 MG capsule Commonly known as: Stebbins these medications    aspirin EC 81 MG tablet Take 1 tablet (81 mg total) by mouth daily.   bromocriptine 2.5 MG tablet Commonly known as: PARLODEL TAKE ONE-HALF TABLET BY  MOUTH DAILY   clopidogrel 75 MG tablet Commonly known as: PLAVIX Take 1 tablet (75 mg total) by mouth daily with breakfast. Start taking on: April 17, 2022   fish oil-omega-3 fatty acids 1000 MG capsule Take 1 g by mouth 2 (two) times daily.   Jardiance 25 MG Tabs tablet Generic drug: empagliflozin TAKE 1 TABLET BY MOUTH  DAILY What  changed: how much to take   metFORMIN 500 MG 24 hr tablet Commonly known as: GLUCOPHAGE-XR TAKE 2 TABLETS BY MOUTH  DAILY What changed:  how much to take when to take this   metoprolol succinate 25 MG 24 hr tablet Commonly known as: Toprol XL Take 0.5 tablets (12.5 mg total) by mouth daily.   nitroGLYCERIN 0.4 MG SL tablet Commonly known as: NITROSTAT DISSOLVE 1 TABLET UNDER THE TONGUE EVERY 5 MINUTES FOR 3 DOSES AS NEEDED. MAY REPEAT EVERY 5 MINUTES FOR UP TO 3 DOSES. What changed:  how much to take how to take this when to take this reasons to take this additional instructions   OneTouch Verio test strip Generic drug: glucose blood 1 each by Other route daily. And lancets 1/day   Ozempic (0.25 or 0.5 MG/DOSE) 2 MG/3ML Sopn Generic drug: Semaglutide(0.25 or 0.'5MG'$ /DOS) Inject 0.5 mg into the skin once a week.   pantoprazole 40 MG tablet Commonly known as: Protonix Take 1 tablet (40 mg total) by mouth daily.   pioglitazone 15 MG tablet Commonly known as: ACTOS TAKE 1 TABLET BY MOUTH  DAILY   simvastatin 40 MG tablet Commonly known as: ZOCOR TAKE 1 TABLET BY MOUTH IN  THE EVENING        Allergies No Known Allergies  Outstanding Labs/Studies   N/a   Duration of Discharge Encounter   Greater than 30 minutes including physician time.  Signed, Reino Bellis, NP 04/16/2022, 10:06 AM

## 2022-04-16 NOTE — Progress Notes (Signed)
CARDIAC REHAB PHASE I   Home education including risk factors, exercise guidelines, site care,asa and Plavix importance, restrictions,heart healthy diabetic diet and CRP2. Pt and wife express understanding of teaching. Pt not sure about CRP2 right now. Order placed for AP. Informed pt that they will follow up with him. Plan for d/c home today.   1315-1400  Vanessa Barbara, RN BSN 04/16/2022 1:59 PM

## 2022-04-17 NOTE — Telephone Encounter (Signed)
Spoke with pt and explained the need to delay colon and Dr Celesta Aver recommendations- he verbalized understanding

## 2022-04-17 NOTE — Telephone Encounter (Signed)
Dr Carlean Purl,  This pt had a cardiac cath yesterday - he had a stent placed and was started on Plavix-  he has a colon 8-21- 23--  do you want an OV or post pone procedure how many Months with OV prior??    Please advise and thanks for your time,Marie PV

## 2022-04-17 NOTE — Telephone Encounter (Signed)
Jan 2024 recall # [id number]- with need for OV prior to scheduling as pt is on Plavix!!  Lelan Pons PV

## 2022-04-17 NOTE — Telephone Encounter (Signed)
Please place a recall for January 2024 - explain to patient we need to delay until it is ok to hold Plavix - he will probably be off Plavix then   Cancel any current appointments  Thanks

## 2022-04-22 ENCOUNTER — Telehealth: Payer: Self-pay | Admitting: *Deleted

## 2022-04-22 NOTE — Telephone Encounter (Signed)
Call placed to pt. To clarify appointment for 04/27/22 and 05/18/22,made pt.aware that appointments cancelled and that he will need to call in January to schedule a OV prior to scheduling procedure,verbalized understanding and all questions answered.

## 2022-05-01 ENCOUNTER — Ambulatory Visit: Payer: Medicare Other | Admitting: Student

## 2022-05-01 ENCOUNTER — Encounter: Payer: Self-pay | Admitting: Student

## 2022-05-01 VITALS — BP 118/58 | HR 78 | Ht 72.0 in | Wt 247.0 lb

## 2022-05-01 DIAGNOSIS — I2510 Atherosclerotic heart disease of native coronary artery without angina pectoris: Secondary | ICD-10-CM

## 2022-05-01 DIAGNOSIS — E785 Hyperlipidemia, unspecified: Secondary | ICD-10-CM | POA: Diagnosis not present

## 2022-05-01 DIAGNOSIS — I10 Essential (primary) hypertension: Secondary | ICD-10-CM

## 2022-05-01 MED ORDER — CLOPIDOGREL BISULFATE 75 MG PO TABS: 75.0000 mg | ORAL_TABLET | Freq: Every day | ORAL | 3 refills | Status: DC

## 2022-05-01 NOTE — Patient Instructions (Signed)
Medication Instructions:  Your physician recommends that you continue on your current medications as directed. Please refer to the Current Medication list given to you today.  HOLD TOPROL-XL FOR 2 WEEKS  Labwork: None  Testing/Procedures: None  Follow-Up: Follow up with Dr. [name] or Bernerd Pho, PA-C in 3 months.   Any Other Special Instructions Will Be Listed Below (If Applicable).     If you need a refill on your cardiac medications before your next appointment, please call your pharmacy.

## 2022-05-01 NOTE — Progress Notes (Signed)
Cardiology Office Note    Date:  05/02/2022   ID:  DARRY KELNHOFER, DOB 01-22-1956, MRN [id number]  PCP:  [facility]  Cardiologist: [name], MD    Chief Complaint  Patient presents with   Follow-up    S/p cath    History of Present Illness:    [name] [name] is a 66 y.o. male with past medical history of CAD (s/p DES to RCA in 2005, low-risk NST's in 2017, 2019 and 09/2020), HLD, Type 2 DM, prior CVA and history of testicular cancer who presents to the office today for follow-up from his recent cardiac catheterization.  He was last examined by myself in 03/2022 following a recent ED visit for chest pain and reported intermittent episodes of chest pain at rest or with activity. Given continued symptoms, a cardiac catheterization was recommended for definitive evaluation. He was continued on ASA, Simvastatin and Jardiance with Toprol-XL 12.5 mg daily being added to his medication regimen. His catheterization was performed by Dr. Martinique on 04/16/2022 and showed a patent stent in the distal RCA but he did have 90% stenosis along the proximal to mid RCA which was treated with PCI/DES placement. He did have normal LV function and normal LVEDP.  In talking with the patient today, he denies any recurrent chest pain since his recent catheterization. Breathing has been stable. No recent orthopnea, PND or pitting edema. No complications regarding his radial cath site. He does experience easy bruising with ASA and Plavix but no melena, hematochezia or hematuria. He does report worsening fatigue and questions if this is possible secondary to Toprol-XL.   Past Medical History:  Diagnosis Date   Arthritis    Cholelithiasis    Coronary atherosclerosis of native coronary artery    a. s/p DES to RCA in 2005 b. cath in 03/2022 showing patent distal RCA stent with 90% stenosis along proximal-mid RCA treated with PCI/DES placement   Essential hypertension    Hyperlipidemia    OA  (osteoarthritis)    Stroke ([place])    Left pons 6/11 - Aggrenox started   Testicular cancer ([place]) 09/28/1976   Type 2 diabetes mellitus ([place])    Urolithiasis     Past Surgical History:  Procedure Laterality Date   Colonoscopy     CORONARY STENT INTERVENTION N/A 04/16/2022   Procedure: CORONARY STENT INTERVENTION;  Surgeon: Martinique, Peter M, MD;  Location: [facility];  Service: Cardiovascular;  Laterality: N/A;   CORONARY STENT PLACEMENT  2005   LEFT HEART CATH AND CORONARY ANGIOGRAPHY N/A 04/16/2022   Procedure: LEFT HEART CATH AND CORONARY ANGIOGRAPHY;  Surgeon: Martinique, Peter M, MD;  Location: [facility];  Service: Cardiovascular;  Laterality: N/A;   Testicular tumor resection     Right side    Current Medications: Outpatient Medications Prior to Visit  Medication Sig Dispense Refill   aspirin EC 81 MG tablet Take 1 tablet (81 mg total) by mouth daily. 90 tablet 3   bromocriptine (PARLODEL) 2.5 MG tablet TAKE ONE-HALF TABLET BY  MOUTH DAILY (Patient taking differently: Take 1.25 mg by mouth daily.) 50 tablet 0   fish oil-omega-3 fatty acids 1000 MG capsule Take 1 g by mouth 2 (two) times daily.     glucose blood (ONETOUCH VERIO) test strip 1 each by Other route daily. And lancets 1/day 100 each 3   JARDIANCE 25 MG TABS tablet TAKE 1 TABLET BY MOUTH  DAILY (Patient taking differently: Take 25 mg by mouth  daily.) 90 tablet 3   metFORMIN (GLUCOPHAGE-XR) 500 MG 24 hr tablet TAKE 2 TABLETS BY MOUTH  DAILY (Patient taking differently: Take 500 mg by mouth in the morning and at bedtime.) 180 tablet 3   metoprolol succinate (TOPROL XL) 25 MG 24 hr tablet Take 0.5 tablets (12.5 mg total) by mouth daily. 15 tablet 11   nitroGLYCERIN (NITROSTAT) 0.4 MG SL tablet DISSOLVE 1 TABLET UNDER THE TONGUE EVERY 5 MINUTES FOR 3 DOSES AS NEEDED. MAY REPEAT EVERY 5 MINUTES FOR UP TO 3 DOSES. (Patient taking differently: Place 0.4 mg under the tongue every 5 (five) minutes as needed for chest  pain.) 25 tablet 3   pantoprazole (PROTONIX) 40 MG tablet Take 1 tablet (40 mg total) by mouth daily. 30 tablet 1   pioglitazone (ACTOS) 15 MG tablet TAKE 1 TABLET BY MOUTH  DAILY (Patient taking differently: Take 15 mg by mouth daily.) 90 tablet 3   Semaglutide,0.25 or 0.'5MG'$ /DOS, (OZEMPIC, 0.25 OR 0.5 MG/DOSE,) 2 MG/3ML SOPN Inject 0.5 mg into the skin once a week. 9 mL 3   simvastatin (ZOCOR) 40 MG tablet TAKE 1 TABLET BY MOUTH IN  THE EVENING 90 tablet 3   clopidogrel (PLAVIX) 75 MG tablet Take 1 tablet (75 mg total) by mouth daily with breakfast. 90 tablet 1   No facility-administered medications prior to visit.     Allergies:   Patient has no known allergies.   Social History   Socioeconomic History   Marital status: Married    Spouse name: [name]   Number of children: 0   Years of education: Not on file   Highest education level: Not on file  Occupational History   Occupation: Truck driver  Tobacco Use   Smoking status: Former    Packs/day: 1.50    Years: 43.00    Total pack years: 64.50    Types: Cigarettes    Quit date: 04/21/2010    Years since quitting: 12.0   Smokeless tobacco: Never  Vaping Use   Vaping Use: Never used  Substance and Sexual Activity   Alcohol use: Yes    Comment: occasionally   Drug use: No   Sexual activity: Not on file  Other Topics Concern   Not on file  Social History Narrative   Not on file   Social Determinants of Health   Financial Resource Strain: Not on file  Food Insecurity: Not on file  Transportation Needs: Not on file  Physical Activity: Not on file  Stress: Not on file  Social Connections: Not on file     Family History:  The patient's family history includes Diabetes in his father; Heart attack in his father; Heart disease in his father.   Review of Systems:    Please see the history of present illness.     All other systems reviewed and are otherwise negative except as noted above.   Physical Exam:    VS:  BP  (!) 118/58   Pulse 78   Ht 6' (1.829 m)   Wt 247 lb (112 kg)   SpO2 95%   BMI 33.50 kg/m    General: Well developed, well nourished,male appearing in no acute distress. Head: Normocephalic, atraumatic. Neck: No carotid bruits. JVD not elevated.  Lungs: Respirations regular and unlabored, without wheezes or rales.  Heart: Regular rate and rhythm. No S3 or S4.  No murmur, no rubs, or gallops appreciated. Abdomen: Appears non-distended. No obvious abdominal masses. Msk:  Strength and tone appear normal for  age. No obvious joint deformities or effusions. Extremities: No clubbing or cyanosis. No pitting edema.  Distal pedal pulses are 2+ bilaterally. Radial cath site stable without ecchymosis or evidence of a hematoma.  Neuro: Alert and oriented X 3. Moves all extremities spontaneously. No focal deficits noted. Psych:  Responds to questions appropriately with a normal affect. Skin: No rashes or lesions noted  Wt Readings from Last 3 Encounters:  05/01/22 247 lb (112 kg)  04/16/22 247 lb (112 kg)  04/10/22 249 lb 12.8 oz (113.3 kg)     Studies/Labs Reviewed:   EKG:  EKG is ordered today. The ekg ordered today demonstrates NSR, HR 77 with no acute ST changes.   Recent Labs: 07/18/2021: ALT 35; TSH 1.18 04/13/2022: BUN 19; Creatinine, Ser 1.18; Hemoglobin 16.4; Platelets 127; Potassium 4.2; Sodium 136   Lipid Panel    Component Value Date/Time   CHOL 148 07/18/2021 1424   TRIG 142.0 07/18/2021 1424   TRIG 96 07/09/2006 1049   HDL 58.30 07/18/2021 1424   CHOLHDL 3 07/18/2021 1424   VLDL 28.4 07/18/2021 1424   LDLCALC 61 07/18/2021 1424   LDLDIRECT 57.0 04/01/2018 1408    Additional studies/ records that were reviewed today include:   LHC: 03/2022   Prox RCA to Mid RCA lesion is 90% stenosed.   Mid RCA lesion is 25% stenosed.   Previously placed Dist RCA stent of unknown type is  widely patent.   A drug-eluting stent was successfully placed using a SYNERGY XD 4.0X12.   Post  intervention, there is a 0% residual stenosis.   The left ventricular systolic function is normal.   LV end diastolic pressure is normal.   The left ventricular ejection fraction is 55-65% by visual estimate.   Single vessel obstructive CAD involving the mid RCA. Old stent in the distal RCA is patent. Normal LV function Normal LVEDP Successful PCI of the mid RCA with DES   Plan: DAPT for 6 months. Anticipate same day DC.  Assessment:    1. Coronary artery disease involving native coronary artery of native heart without angina pectoris   2. Essential hypertension   3. Hyperlipidemia LDL goal <70      Plan:   In order of problems listed above:  1. CAD - He is s/p DES to RCA in 2005 and recent catheterization last month showed a patent stent in the distal RCA but he did have 90% stenosis along the proximal to mid RCA which was treated with PCI/DES placement.  - He denies any recurrent anginal symptoms and radial cath site appears normal on examination today.  - Continue current medical therapy with ASA, Plavix and Simvastatin. Also on Jardiance for Type 2 DM. Will temporarily hold Toprol-XL as outlined below.   2. HTN - BP is well-controlled at 118/58 during today's visit. He does report worsening fatigue for the past few weeks and is unsure if this is due to Toprol-XL 12.'5mg'$  daily. I recommended he hold this for 2 weeks to see if symptoms improve.   3. HLD - Recent FLP in 06/2021 showed total cholesterol of 148, triglycerides 142, HDL 58 and LDL 61. Remains on Simvastatin '40mg'$  daily. He was previously intolerant to higher-intensity statin therapy. Has made dietary changes since his recent catheterization as well.    Medication Adjustments/Labs and Tests Ordered: Current medicines are reviewed at length with the patient today.  Concerns regarding medicines are outlined above.  Medication changes, Labs and Tests ordered today are listed in the  Patient Instructions below. Patient  Instructions  Medication Instructions:  Your physician recommends that you continue on your current medications as directed. Please refer to the Current Medication list given to you today.  HOLD TOPROL-XL FOR 2 WEEKS  Labwork: None  Testing/Procedures: None  Follow-Up: Follow up with Dr. [name] or Bernerd Pho, PA-C in 3 months.   Any Other Special Instructions Will Be Listed Below (If Applicable).     If you need a refill on your cardiac medications before your next appointment, please call your pharmacy.    Signed, Erma Heritage, PA-C  05/02/2022 8:52 AM    [place] [address] [place], [place] [address] Phone: [phone] Fax: [phone]

## 2022-05-02 ENCOUNTER — Encounter: Payer: Self-pay | Admitting: Student

## 2022-05-18 ENCOUNTER — Encounter: Payer: Medicare Other | Admitting: Internal Medicine

## 2022-05-28 DIAGNOSIS — H2513 Age-related nuclear cataract, bilateral: Secondary | ICD-10-CM | POA: Diagnosis not present

## 2022-05-28 LAB — HM DIABETES EYE EXAM

## 2022-06-07 ENCOUNTER — Other Ambulatory Visit: Payer: Self-pay | Admitting: Internal Medicine

## 2022-06-12 ENCOUNTER — Other Ambulatory Visit (HOSPITAL_COMMUNITY): Payer: Self-pay

## 2022-07-27 DIAGNOSIS — E119 Type 2 diabetes mellitus without complications: Secondary | ICD-10-CM | POA: Diagnosis not present

## 2022-07-27 DIAGNOSIS — Z0001 Encounter for general adult medical examination with abnormal findings: Secondary | ICD-10-CM | POA: Diagnosis not present

## 2022-07-27 DIAGNOSIS — I700 Atherosclerosis of aorta: Secondary | ICD-10-CM | POA: Diagnosis not present

## 2022-07-27 DIAGNOSIS — E7849 Other hyperlipidemia: Secondary | ICD-10-CM | POA: Diagnosis not present

## 2022-08-12 ENCOUNTER — Encounter: Payer: Self-pay | Admitting: Student

## 2022-08-12 ENCOUNTER — Ambulatory Visit: Payer: Medicare Other | Attending: Student | Admitting: Student

## 2022-08-12 ENCOUNTER — Other Ambulatory Visit (HOSPITAL_COMMUNITY)
Admission: RE | Admit: 2022-08-12 | Discharge: 2022-08-12 | Disposition: A | Discharge status: Home / Self Care | Payer: Medicare Other | Source: Ambulatory Visit | Attending: Student | Admitting: Student

## 2022-08-12 VITALS — BP 122/76 | HR 64 | Ht 72.0 in | Wt 253.0 lb

## 2022-08-12 DIAGNOSIS — E1159 Type 2 diabetes mellitus with other circulatory complications: Secondary | ICD-10-CM | POA: Diagnosis not present

## 2022-08-12 DIAGNOSIS — I2510 Atherosclerotic heart disease of native coronary artery without angina pectoris: Secondary | ICD-10-CM

## 2022-08-12 DIAGNOSIS — E785 Hyperlipidemia, unspecified: Secondary | ICD-10-CM | POA: Diagnosis not present

## 2022-08-12 LAB — HEMOGLOBIN A1C
Hgb A1c MFr Bld: 6.8 % — ABNORMAL HIGH (ref 4.8–5.6)
Mean Plasma Glucose: 148.46 mg/dL

## 2022-08-12 LAB — LIPID PANEL
Cholesterol: 151 mg/dL (ref 0–200)
HDL: 55 mg/dL (ref >40)
LDL Cholesterol: 54 mg/dL (ref 0–99)
Total CHOL/HDL Ratio: 2.7 RATIO
Triglycerides: 211 mg/dL — ABNORMAL HIGH (ref <150)
VLDL: 42 mg/dL — ABNORMAL HIGH (ref 0–40)

## 2022-08-12 LAB — CBC
HCT: 46.5 % (ref 39.0–52.0)
Hemoglobin: 15.8 g/dL (ref 13.0–17.0)
MCH: 30 pg (ref 26.0–34.0)
MCHC: 34 g/dL (ref 30.0–36.0)
MCV: 88.4 fL (ref 80.0–100.0)
Platelets: 128 10*3/uL — ABNORMAL LOW (ref 150–400)
RBC: 5.26 MIL/uL (ref 4.22–5.81)
RDW: 14.1 % (ref 11.5–15.5)
WBC: 6.8 10*3/uL (ref 4.0–10.5)
nRBC: 0 % (ref 0.0–0.2)

## 2022-08-12 LAB — COMPREHENSIVE METABOLIC PANEL
ALT: 32 U/L (ref 0–44)
AST: 24 U/L (ref 15–41)
Albumin: 3.9 g/dL (ref 3.5–5.0)
Alkaline Phosphatase: 55 U/L (ref 38–126)
Anion gap: 7 (ref 5–15)
BUN: 22 mg/dL (ref 8–23)
CO2: 26 mmol/L (ref 22–32)
Calcium: 9.2 mg/dL (ref 8.9–10.3)
Chloride: 105 mmol/L (ref 98–111)
Creatinine, Ser: 1.08 mg/dL (ref 0.61–1.24)
GFR, Estimated: >60 mL/min (ref >60)
Glucose, Bld: 141 mg/dL — ABNORMAL HIGH (ref 70–99)
Potassium: 4 mmol/L (ref 3.5–5.1)
Sodium: 138 mmol/L (ref 135–145)
Total Bilirubin: 0.5 mg/dL (ref 0.3–1.2)
Total Protein: 7 g/dL (ref 6.5–8.1)

## 2022-08-12 NOTE — Patient Instructions (Signed)
Medication Instructions:  Your physician has recommended you make the following change in your medication:  -Stop Toprol-Xl   Labwork: Today: -CMET -CBC -Hgb A1C -Lipid Panel  Testing/Procedures: None  Follow-Up: Follow up with Dr. [name] in 6 months.  Any Other Special Instructions Will Be Listed Below (If Applicable). You have been referred to [facility]. They will contact you with your first appointment.     If you need a refill on your cardiac medications before your next appointment, please call your pharmacy.

## 2022-08-12 NOTE — Progress Notes (Signed)
Cardiology Office Note    Date:  08/12/2022   ID:  [name] [name], DOB 1956/01/12, MRN [id number]  PCP:  [facility]  Cardiologist: [name], MD    Chief Complaint  Patient presents with   Follow-up    3 month visit    History of Present Illness:    [name] [name] is a 66 y.o. male  with past medical history of CAD (s/p DES to RCA in 2005, low-risk NST's in 2017, 2019 and 09/2020, cath in 03/2022 showing 90% stenosis along proximal to mid-RCA treated with DES placement), HLD, Type 2 DM, prior CVA and history of testicular cancer who presents to the office today for 34-monthfollow-up.  He was last examined by myself in 04/2022 following recent stent placement and denied any recurrent chest pain since PCI. He was having worsening fatigue and questioned if symptoms were possibly secondary to Toprol-XL 12.'5mg'$  daily, therefore this was temporarily held to see if his symptoms would improve. He was continued on ASA, Plavix and Simvastatin (previously intolerant to higher intensity statin therapy).  In talking with the patient and his wife today, he denies any recurrent chest pain since his last office visit. He does have baseline dyspnea on exertion but no acute changes in this. No specific palpitations, orthopnea, PND or pitting edema. He was previously followed by Dr. ELoanne Drillingin GKielerfor Type 2 DM but [facility]. His wife believes he is on too many medications for his diabetes but he does report eating "what he wants" which includes pasta, bread, potatoes and sweets.   Past Medical History:  Diagnosis Date   Arthritis    Cholelithiasis    Coronary atherosclerosis of native coronary artery    a. s/p DES to RCA in 2005 b. cath in 03/2022 showing patent distal RCA stent with 90% stenosis along proximal-mid RCA treated with PCI/DES placement   Essential hypertension    Hyperlipidemia    OA  (osteoarthritis)    [place]    Left pons 6/11 - Aggrenox started   [place] 09[place]28/1976   Type 2 diabetes mellitus ([place]    Urolithiasis     Past Surgical History:  Procedure Laterality Date   Colonoscopy     CORONARY STENT INTERVENTION N/A 04/16/2022   Procedure: CORONARY STENT INTERVENTION;  Surgeon: JMartinique Peter M, MD;  [facility];  Service: Cardiovascular;  Laterality: N/A;   CORONARY STENT PLACEMENT  2005   LEFT HEART CATH AND CORONARY ANGIOGRAPHY N/A 04/16/2022   Procedure: LEFT HEART CATH AND CORONARY ANGIOGRAPHY;  Surgeon: JMartinique Peter M, MD;  [facility];  Service: Cardiovascular;  Laterality: N/A;   Testicular tumor resection     Right side    Current Medications: Outpatient Medications Prior to Visit  Medication Sig Dispense Refill   aspirin EC 81 MG tablet Take 1 tablet (81 mg total) by mouth daily. 90 tablet 3   bromocriptine (PARLODEL) 2.5 MG tablet TAKE ONE-HALF TABLET BY MOUTH  DAILY 50 tablet 0   clopidogrel (PLAVIX) 75 MG tablet Take 1 tablet (75 mg total) by mouth daily with breakfast. 90 tablet 3   fish oil-omega-3 fatty acids 1000 MG capsule Take 1 g by mouth 2 (two) times daily.     glucose blood (ONETOUCH VERIO) test strip 1 each by Other route daily. And lancets 1/day 100 each 3   JARDIANCE 25 MG TABS tablet TAKE 1 TABLET  BY MOUTH  DAILY (Patient taking differently: Take 25 mg by mouth daily.) 90 tablet 3   metFORMIN (GLUCOPHAGE-XR) 500 MG 24 hr tablet TAKE 2 TABLETS BY MOUTH  DAILY (Patient taking differently: Take 500 mg by mouth in the morning and at bedtime.) 180 tablet 3   nitroGLYCERIN (NITROSTAT) 0.4 MG SL tablet DISSOLVE 1 TABLET UNDER THE TONGUE EVERY 5 MINUTES FOR 3 DOSES AS NEEDED. MAY REPEAT EVERY 5 MINUTES FOR UP TO 3 DOSES. (Patient taking differently: Place 0.4 mg under the tongue every 5 (five) minutes as needed for chest pain.) 25 tablet 3   pantoprazole (PROTONIX) 40 MG tablet Take 1 tablet  (40 mg total) by mouth daily. 30 tablet 1   pioglitazone (ACTOS) 15 MG tablet TAKE 1 TABLET BY MOUTH  DAILY (Patient taking differently: Take 15 mg by mouth daily.) 90 tablet 3   Semaglutide,0.25 or 0.'5MG'$ /DOS, (OZEMPIC, 0.25 OR 0.5 MG/DOSE,) 2 MG/3ML SOPN Inject 0.5 mg into the skin once a week. 9 mL 3   simvastatin (ZOCOR) 40 MG tablet TAKE 1 TABLET BY MOUTH IN  THE EVENING 90 tablet 3   metoprolol succinate (TOPROL XL) 25 MG 24 hr tablet Take 0.5 tablets (12.5 mg total) by mouth daily. 15 tablet 11   No facility-administered medications prior to visit.     Allergies:   Patient has no known allergies.   Social History   Socioeconomic History   Marital status: Married    Spouse name: [name]   Number of children: 0   Years of education: Not on file   Highest education level: Not on file  Occupational History   Occupation: Truck driver  Tobacco Use   Smoking status: Former    Packs/day: 1.50    Years: 43.00    Total pack years: 64.50    Types: Cigarettes    Quit date: 04/21/2010    Years since quitting: 12.3   Smokeless tobacco: Never  Vaping Use   Vaping Use: Never used  Substance and Sexual Activity   Alcohol use: Yes    Comment: occasionally   Drug use: No   Sexual activity: Not on file  Other Topics Concern   Not on file  Social History Narrative   Not on file   Social Determinants of Health   Financial Resource Strain: Not on file  Food Insecurity: Not on file  Transportation Needs: Not on file  Physical Activity: Not on file  Stress: Not on file  Social Connections: Not on file     Family History:  The patient's family history includes Diabetes in his father; Heart attack in his father; Heart disease in his father.   Review of Systems:    Please see the history of present illness.     All other systems reviewed and are otherwise negative except as noted above.   Physical Exam:    VS:  BP 122/76   Pulse 64   Ht 6' (1.829 m)   Wt 253 lb (114.8 kg)    SpO2 97%   BMI 34.31 kg/m    General: Well developed, well nourished,male appearing in no acute distress. Head: Normocephalic, atraumatic. Neck: No carotid bruits. JVD not elevated.  Lungs: Respirations regular and unlabored, without wheezes or rales.  Heart: Regular rate and rhythm. No S3 or S4.  No murmur, no rubs, or gallops appreciated. Abdomen: Appears non-distended. No obvious abdominal masses. Msk:  Strength and tone appear normal for age. No obvious joint deformities or effusions. Extremities: No clubbing  or cyanosis. No pitting edema.  Distal pedal pulses are 2+ bilaterally. Neuro: Alert and oriented X 3. Moves all extremities spontaneously. No focal deficits noted. Psych:  Responds to questions appropriately with a normal affect. Skin: No rashes or lesions noted  Wt Readings from Last 3 Encounters:  08/12/22 253 lb (114.8 kg)  05/01/22 247 lb (112 kg)  04/16/22 247 lb (112 kg)     Studies/Labs Reviewed:   EKG:  EKG is not ordered today.   Recent Labs: 08/12/2022: ALT 32; BUN 22; Creatinine, Ser 1.08; Hemoglobin 15.8; Platelets 128; Potassium 4.0; Sodium 138   Lipid Panel    Component Value Date/Time   CHOL 151 08/12/2022 1502   TRIG 211 (H) 08/12/2022 1502   TRIG 96 07/09/2006 1049   HDL 55 08/12/2022 1502   CHOLHDL 2.7 08/12/2022 1502   VLDL 42 (H) 08/12/2022 1502   LDLCALC 54 08/12/2022 1502   LDLDIRECT 57.0 04/01/2018 1408    Additional studies/ records that were reviewed today include:   LHC: 03/2022   Prox RCA to Mid RCA lesion is 90% stenosed.   Mid RCA lesion is 25% stenosed.   Previously placed Dist RCA stent of unknown type is  widely patent.   A drug-eluting stent was successfully placed using a SYNERGY XD 4.0X12.   Post intervention, there is a 0% residual stenosis.   The left ventricular systolic function is normal.   LV end diastolic pressure is normal.   The left ventricular ejection fraction is 55-65% by visual estimate.   Single vessel  obstructive CAD involving the mid RCA. Old stent in the distal RCA is patent. Normal LV function Normal LVEDP Successful PCI of the mid RCA with DES   Plan: DAPT for 6 months. Anticipate same day DC.  Assessment:    1. Coronary artery disease involving native coronary artery of native heart without angina pectoris   2. Hyperlipidemia LDL goal <70   3. Type 2 diabetes mellitus with other circulatory complication, without long-term current use of insulin ([place])      Plan:   In order of problems listed above:  1. CAD - He is s/p DES to distal RCA in 2005 with recent DES placement to the proximal-mid RCA in 03/2022. No recent anginal symptoms.  - Continue current medical therapy with ASA 81 mg daily, Plavix 75 mg daily and Simvastatin 40 mg daily. Recheck CBC and CMET. He has experienced intermittent dizziness and I recommended holding Toprol-XL 12.'5mg'$  daily for now to see if symptoms improve.   2. HLD - Followed by his PCP. LDL was at 61 in 06/2021. Will recheck an FLP and LFT's. He was previously intolerant to higher intensity statin therapy and has remained on Simvastatin 40 mg daily and Fish Oil.  3. Type 2 DM - He requests referral to Endocrinology and this will be entered today. Will recheck a Hgb A1c given no recent valves. Remains on Metformin, Actos, Ozempic (has been without this for 3 weeks given no current availability at his Pharmacy) and Jardiance.     Medication Adjustments/Labs and Tests Ordered: Current medicines are reviewed at length with the patient today.  Concerns regarding medicines are outlined above.  Medication changes, Labs and Tests ordered today are listed in the Patient Instructions below. Patient Instructions  Medication Instructions:  Your physician has recommended you make the following change in your medication:  -Stop Toprol-Xl   Labwork: Today: -CMET -CBC -Hgb A1C -Lipid Panel  Testing/Procedures: None  Follow-Up: Follow up  with Dr.  [name] in 6 months.  Any Other Special Instructions Will Be Listed Below (If Applicable). You have been referred to [facility]. They will contact you with your first appointment.     If you need a refill on your cardiac medications before your next appointment, please call your pharmacy.    Signed, Erma Heritage, PA-C  08/12/2022 4:38 PM    [facility] [address] [place], [place] [address] Phone: [phone] Fax: [phone]

## 2022-09-03 DIAGNOSIS — J069 Acute upper respiratory infection, unspecified: Secondary | ICD-10-CM | POA: Diagnosis not present

## 2022-09-03 DIAGNOSIS — J441 Chronic obstructive pulmonary disease with (acute) exacerbation: Secondary | ICD-10-CM | POA: Diagnosis not present

## 2022-09-10 DIAGNOSIS — R0609 Other forms of dyspnea: Secondary | ICD-10-CM | POA: Diagnosis not present

## 2022-09-11 ENCOUNTER — Other Ambulatory Visit (HOSPITAL_COMMUNITY): Payer: Self-pay | Admitting: Radiology

## 2022-09-11 DIAGNOSIS — R0609 Other forms of dyspnea: Secondary | ICD-10-CM

## 2022-09-15 ENCOUNTER — Ambulatory Visit (HOSPITAL_COMMUNITY)
Admission: RE | Admit: 2022-09-15 | Discharge: 2022-09-15 | Disposition: A | Discharge status: Home / Self Care | Payer: Medicare Other | Source: Ambulatory Visit | Attending: Student | Admitting: Student

## 2022-09-15 DIAGNOSIS — R0609 Other forms of dyspnea: Secondary | ICD-10-CM | POA: Diagnosis not present

## 2022-09-15 LAB — PULMONARY FUNCTION TEST
DL/VA % pred: 84 %
DL/VA: 3.4 ml/min/mmHg/L
DLCO unc % pred: 70 %
DLCO unc: 19.61 ml/min/mmHg
FEF 25-75 Post: 3.96 L/sec
FEF 25-75 Pre: 3.36 L/sec
FEF2575-%Change-Post: 17 %
FEF2575-%Pred-Post: 143 %
FEF2575-%Pred-Pre: 121 %
FEV1-%Change-Post: 2 %
FEV1-%Pred-Post: 89 %
FEV1-%Pred-Pre: 87 %
FEV1-Post: 3.21 L
FEV1-Pre: 3.13 L
FEV1FVC-%Change-Post: 4 %
FEV1FVC-%Pred-Pre: 109 %
FEV6-%Change-Post: -2 %
FEV6-%Pred-Post: 82 %
FEV6-%Pred-Pre: 83 %
FEV6-Post: 3.77 L
FEV6-Pre: 3.85 L
FEV6FVC-%Change-Post: 0 %
FEV6FVC-%Pred-Post: 105 %
FEV6FVC-%Pred-Pre: 105 %
FVC-%Change-Post: -1 %
FVC-%Pred-Post: 77 %
FVC-%Pred-Pre: 79 %
FVC-Post: 3.77 L
FVC-Pre: 3.85 L
Post FEV1/FVC ratio: 85 %
Post FEV6/FVC ratio: 100 %
Pre FEV1/FVC ratio: 81 %
Pre FEV6/FVC Ratio: 100 %
RV % pred: 51 %
RV: 1.29 L
TLC % pred: 74 %
TLC: 5.49 L

## 2022-09-15 MED ORDER — ALBUTEROL SULFATE (2.5 MG/3ML) 0.083% IN NEBU
2.5000 mg | INHALATION_SOLUTION | Freq: Once | RESPIRATORY_TRACT | Status: AC
  Administered 2022-09-15: 2.5 mg via RESPIRATORY_TRACT

## 2022-10-06 ENCOUNTER — Ambulatory Visit: Payer: BLUE CROSS/BLUE SHIELD | Admitting: Nurse Practitioner

## 2022-10-06 ENCOUNTER — Encounter: Payer: Self-pay | Admitting: Nurse Practitioner

## 2022-10-06 VITALS — BP 130/81 | HR 80 | Ht 72.0 in | Wt 246.2 lb

## 2022-10-06 DIAGNOSIS — E119 Type 2 diabetes mellitus without complications: Secondary | ICD-10-CM | POA: Diagnosis not present

## 2022-10-06 MED ORDER — SEMAGLUTIDE (1 MG/DOSE) 4 MG/3ML ~~LOC~~ SOPN: 1.0000 mg | PEN_INJECTOR | SUBCUTANEOUS | 0 refills | Status: DC

## 2022-10-06 NOTE — Patient Instructions (Signed)

## 2022-10-06 NOTE — Progress Notes (Signed)
Endocrinology Consult Note       10/06/2022, 11:54 AM   Subjective:    Patient ID: [name] [name], male    DOB: 03-20-1956.  [name] [name] is being seen in consultation for management of currently uncontrolled symptomatic diabetes requested by  [facility].   Past Medical History:  Diagnosis Date   Arthritis    Cholelithiasis    Coronary atherosclerosis of native coronary artery    a. s/p DES to RCA in 2005 b. cath in 03/2022 showing patent distal RCA stent with 90% stenosis along proximal-mid RCA treated with PCI/DES placement   Essential hypertension    Hyperlipidemia    OA (osteoarthritis)    Stroke ([place])    Left pons 6/11 - Aggrenox started   Testicular cancer ([place]) 09/28/1976   Type 2 diabetes mellitus ([place])    Urolithiasis     Past Surgical History:  Procedure Laterality Date   Colonoscopy     CORONARY STENT INTERVENTION N/A 04/16/2022   Procedure: CORONARY STENT INTERVENTION;  Surgeon: Martinique, Peter M, MD;  Location: [facility];  Service: Cardiovascular;  Laterality: N/A;   CORONARY STENT PLACEMENT  2005   LEFT HEART CATH AND CORONARY ANGIOGRAPHY N/A 04/16/2022   Procedure: LEFT HEART CATH AND CORONARY ANGIOGRAPHY;  Surgeon: Martinique, Peter M, MD;  Location: [facility];  Service: Cardiovascular;  Laterality: N/A;   Testicular tumor resection     Right side    Social History   Socioeconomic History   Marital status: Married    Spouse name: [name]   Number of children: 0   Years of education: Not on file   Highest education level: Not on file  Occupational History   Occupation: Truck driver  Tobacco Use   Smoking status: Former    Packs/day: 1.50    Years: 43.00    Total pack years: 64.50    Types: Cigarettes    Quit date: 04/21/2010    Years since quitting: 12.4   Smokeless tobacco: Never  Vaping Use   Vaping Use: Never used  Substance and Sexual Activity    Alcohol use: Yes    Comment: occasionally   Drug use: No   Sexual activity: Not on file  Other Topics Concern   Not on file  Social History Narrative   Not on file   Social Determinants of Health   Financial Resource Strain: Not on file  Food Insecurity: Not on file  Transportation Needs: Not on file  Physical Activity: Not on file  Stress: Not on file  Social Connections: Not on file    Family History  Problem Relation Age of Onset   Heart disease Father    Diabetes Father    Heart attack Father    Colon cancer Neg Hx    Esophageal cancer Neg Hx    Rectal cancer Neg Hx    Stomach cancer Neg Hx    Colon polyps Neg Hx     Outpatient Encounter Medications as of 10/06/2022  Medication Sig   aspirin EC 81 MG tablet Take 1 tablet (81 mg total) by mouth daily.   clopidogrel (PLAVIX) 75 MG tablet Take 1 tablet (  75 mg total) by mouth daily with breakfast.   fish oil-omega-3 fatty acids 1000 MG capsule Take 1 g by mouth 2 (two) times daily.   glucose blood (ONETOUCH VERIO) test strip 1 each by Other route daily. And lancets 1/day   metFORMIN (GLUCOPHAGE-XR) 500 MG 24 hr tablet TAKE 2 TABLETS BY MOUTH  DAILY (Patient taking differently: Take 500 mg by mouth in the morning and at bedtime.)   nitroGLYCERIN (NITROSTAT) 0.4 MG SL tablet DISSOLVE 1 TABLET UNDER THE TONGUE EVERY 5 MINUTES FOR 3 DOSES AS NEEDED. MAY REPEAT EVERY 5 MINUTES FOR UP TO 3 DOSES. (Patient taking differently: Place 0.4 mg under the tongue every 5 (five) minutes as needed for chest pain.)   pantoprazole (PROTONIX) 40 MG tablet Take 1 tablet (40 mg total) by mouth daily.   Semaglutide, 1 MG/DOSE, 4 MG/3ML SOPN Inject 1 mg as directed once a week.   simvastatin (ZOCOR) 40 MG tablet TAKE 1 TABLET BY MOUTH IN  THE EVENING   [DISCONTINUED] bromocriptine (PARLODEL) 2.5 MG tablet TAKE ONE-HALF TABLET BY MOUTH  DAILY   [DISCONTINUED] JARDIANCE 25 MG TABS tablet TAKE 1 TABLET BY MOUTH  DAILY (Patient taking differently:  Take 25 mg by mouth daily.)   [DISCONTINUED] pioglitazone (ACTOS) 15 MG tablet TAKE 1 TABLET BY MOUTH  DAILY (Patient taking differently: Take 15 mg by mouth daily.)   [DISCONTINUED] Semaglutide,0.25 or 0.'5MG'$ /DOS, (OZEMPIC, 0.25 OR 0.5 MG/DOSE,) 2 MG/3ML SOPN Inject 0.5 mg into the skin once a week.   No facility-administered encounter medications on file as of 10/06/2022.    ALLERGIES: No Known Allergies  VACCINATION STATUS: Immunization History  Administered Date(s) Administered   Pneumococcal Polysaccharide-23 07/15/2009   Tdap 03/05/2017   Zoster, Live 02/10/2016    Diabetes He presents for his initial diabetic visit. He has type 2 diabetes mellitus. Onset time: diagnosed at approx age of 34. His disease course has been stable. Hypoglycemia symptoms include hunger, nervousness/anxiousness, sweats and tremors. Associated symptoms include weight loss. There are no hypoglycemic complications. Diabetic complications include a CVA and heart disease (CAD). Risk factors for coronary artery disease include diabetes mellitus, dyslipidemia, family history, obesity, male sex, hypertension and sedentary lifestyle. Current diabetic treatment includes oral agent (triple therapy) (Parlodel, Jardiance, Metformin, Actos, Ozempic). He is compliant with treatment most of the time. His weight is fluctuating minimally. He is following a generally unhealthy diet. When asked about meal planning, he reported none. He has not had a previous visit with a dietitian. He rarely participates in exercise. (He presents today for his consultation, accompanied by his wife, with no meter or logs to review.  He does not routinely monitor glucose.  His most recent A1c was 6.8% on 08/12/22.  He is a previous patient of Dr. [name].  He drinks mostly diet mt dew, sweet tea, coffee with SF additive.  He eats 3 meals per day and snacks some.  He does not engage in routine physical activity.  He is UTD on eye exam, does see podiatry  routinely.) An ACE inhibitor/angiotensin II receptor blocker is not being taken. He sees a podiatrist.Eye exam is current.     Review of systems  Constitutional: + Minimally fluctuating body weight, current Body mass index is 33.39 kg/m., no fatigue, no subjective hyperthermia, no subjective hypothermia Eyes: no blurry vision, no xerophthalmia ENT: no sore throat, no nodules palpated in throat, no dysphagia/odynophagia, no hoarseness Cardiovascular: no chest pain, no shortness of breath, no palpitations, no leg swelling Respiratory: no cough,  no shortness of breath Gastrointestinal: no nausea/vomiting/diarrhea Musculoskeletal: no muscle/joint aches Skin: no rashes, no hyperemia Neurological: no tremors, no numbness, no tingling, no dizziness Psychiatric: no depression, no anxiety  Objective:     BP 130/81 (BP Location: Right Arm, Patient Position: Sitting, Cuff Size: Large)   Pulse 80   Ht 6' (1.829 m)   Wt 246 lb 3.2 oz (111.7 kg)   BMI 33.39 kg/m   Wt Readings from Last 3 Encounters:  10/06/22 246 lb 3.2 oz (111.7 kg)  08/12/22 253 lb (114.8 kg)  05/01/22 247 lb (112 kg)     BP Readings from Last 3 Encounters:  10/06/22 130/81  08/12/22 122/76  05/01/22 (!) 118/58     Physical Exam- Limited  Constitutional:  Body mass index is 33.39 kg/m. , not in acute distress, normal state of mind Eyes:  EOMI, no exophthalmos Neck: Supple Cardiovascular: RRR, no murmurs, rubs, or gallops, no edema Respiratory: Adequate breathing efforts, no crackles, rales, rhonchi, or wheezing Musculoskeletal: no gross deformities, strength intact in all four extremities, no gross restriction of joint movements Skin:  no rashes, no hyperemia Neurological: no tremor with outstretched hands    CMP ( most recent) CMP     Component Value Date/Time   NA 138 08/12/2022 1502   K 4.0 08/12/2022 1502   CL 105 08/12/2022 1502   CO2 26 08/12/2022 1502   GLUCOSE 141 (H) 08/12/2022 1502    GLUCOSE 107 (H) 07/09/2006 1049   BUN 22 08/12/2022 1502   CREATININE 1.08 08/12/2022 1502   CALCIUM 9.2 08/12/2022 1502   PROT 7.0 08/12/2022 1502   ALBUMIN 3.9 08/12/2022 1502   AST 24 08/12/2022 1502   ALT 32 08/12/2022 1502   ALKPHOS 55 08/12/2022 1502   BILITOT 0.5 08/12/2022 1502   GFRNONAA >60 08/12/2022 1502   GFRAA >60 10/26/2015 1936     Diabetic Labs (most recent): Lab Results  Component Value Date   HGBA1C 6.8 (H) 08/12/2022   HGBA1C 7.2 (A) 11/27/2021   HGBA1C 6.8 (A) 07/18/2021   MICROALBUR <0.7 04/01/2018   MICROALBUR <0.7 03/05/2017   MICROALBUR <0.7 02/10/2016     Lipid Panel ( most recent) Lipid Panel     Component Value Date/Time   CHOL 151 08/12/2022 1502   TRIG 211 (H) 08/12/2022 1502   TRIG 96 07/09/2006 1049   HDL 55 08/12/2022 1502   CHOLHDL 2.7 08/12/2022 1502   VLDL 42 (H) 08/12/2022 1502   LDLCALC 54 08/12/2022 1502   LDLDIRECT 57.0 04/01/2018 1408      Lab Results  Component Value Date   TSH 1.18 07/18/2021   TSH 1.27 01/26/2020   TSH 2.34 04/01/2018   TSH 2.14 03/05/2017   TSH 1.27 02/10/2016   TSH 1.35 11/02/2014   TSH 1.64 05/19/2013   TSH 1.21 12/16/2011   TSH 1.27 08/01/2010   TSH 1.39 06/13/2009   FREET4 0.70 07/18/2021           Assessment & Plan:   1) Type 2 Diabetes mellitus without complication without long-term current use of insulin  He presents today for his consultation, accompanied by his wife, with no meter or logs to review.  He does not routinely monitor glucose.  His most recent A1c was 6.8% on 08/12/22.  He is a previous patient of Dr. [name].  He drinks mostly diet mt dew, sweet tea, coffee with SF additive.  He eats 3 meals per day and snacks some.  He does not engage in  routine physical activity.  He is UTD on eye exam, does see podiatry routinely.  [name] [name] has currently uncontrolled symptomatic type 2 DM since 67 years of age, with most recent A1c of 6.8 %.   -Recent labs reviewed.  -  I had a long discussion with him about the progressive nature of diabetes and the pathology behind its complications. -his diabetes is complicated by CAD with stents, CVA and he remains at a high risk for more acute and chronic complications which include CAD, CVA, CKD, retinopathy, and neuropathy. These are all discussed in detail with him.  The following Lifestyle Medicine recommendations according to [facility]) were discussed and offered to patient and he agrees to start the journey:  A. Whole Foods, Plant-based plate comprising of fruits and vegetables, plant-based proteins, whole-grain carbohydrates was discussed in detail with the patient.   A list for source of those nutrients were also provided to the patient.  Patient will use only water or unsweetened tea for hydration. B.  The need to stay away from risky substances including alcohol, smoking; obtaining 7 to 9 hours of restorative sleep, at least 150 minutes of moderate intensity exercise weekly, the importance of healthy social connections,  and stress reduction techniques were discussed. C.  A full color page of  Calorie density of various food groups per pound showing examples of each food groups was provided to the patient.  - I have counseled him on diet and weight management by adopting a carbohydrate restricted/protein rich diet. Patient is encouraged to switch to unprocessed or minimally processed complex starch and increased protein intake (animal or plant source), fruits, and vegetables. -  he is advised to stick to a routine mealtimes to eat 3 meals a day and avoid unnecessary snacks (to snack only to correct hypoglycemia).   - he acknowledges that there is a room for improvement in his food and drink choices. - Suggestion is made for him to avoid simple carbohydrates from his diet including Cakes, Sweet Desserts, Ice Cream, Soda (diet and regular), Sweet Tea, Candies, Chips, Cookies, Store Bought  Juices, Alcohol in Excess of 1-2 drinks a day, Artificial Sweeteners, Coffee Creamer, and "Sugar-free" Products. This will help patient to have more stable blood glucose profile and potentially avoid unintended weight gain.  - I have approached him with the following individualized plan to manage his diabetes and patient agrees:   He will benefit from de-escalation of his treatment.  Currently he is on 5 different medications to manage his diabetes.  He is a retired Administrator and every attempt was made to keep him off insulin in the past.  -he is encouraged to start monitoring glucose once daily, before breakfast and to call the clinic if he has readings less than 70 or above 300 for 3 tests in a row.  - Adjustment parameters are given to him for hypo and hyperglycemia in writing.  - he is advised to continue Metformin 500 md XR twice daily with meals, therapeutically suitable for patient.  Will increase his Ozempic to 1 mg SQ weekly which will offer some protection from CAD disease process.  - his Actos, Parlodel, and Jardiance will be discontinued, risk outweighs benefit for this patient.  He does not have significant CKD or CHF.  - Specific targets for  A1c; LDL, HDL, and Triglycerides were discussed with the patient.  2) Blood Pressure /Hypertension:  his blood pressure is controlled to target without the use  of antihypertensive medications.     3) Lipids/Hyperlipidemia:    Review of his recent lipid panel from 11/21/21 showed controlled LDL at 88 .  he is advised to continue Simvastatin 40 mg daily at bedtime.  Side effects and precautions discussed with him.  4)  Weight/Diet:  his Body mass index is 33.39 kg/m.  -  clearly complicating his diabetes care.   he is a candidate for weight loss. I discussed with him the fact that loss of 5 - 10% of his  current body weight will have the most impact on his diabetes management.  Exercise, and detailed carbohydrates information provided  -   detailed on discharge instructions.  5) Chronic Care/Health Maintenance: -he is not on ACEI/ARB and is on Statin medications and is encouraged to initiate and continue to follow up with Ophthalmology, Dentist, Podiatrist at least yearly or according to recommendations, and advised to stay away from smoking. I have recommended yearly flu vaccine and pneumonia vaccine at least every 5 years; moderate intensity exercise for up to 150 minutes weekly; and sleep for at least 7 hours a day.  - he is advised to maintain close follow up with [facility] for primary care needs, as well as his other providers for optimal and coordinated care.   - Time spent in this patient care: 60 min, of which > 50% was spent in counseling him about his diabetes and the rest reviewing his blood glucose logs, discussing his hypoglycemia and hyperglycemia episodes, reviewing his current and previous labs/studies (including abstraction from other facilities) and medications doses and developing a long term treatment plan based on the latest standards of care/guidelines; and documenting his care.    Please refer to Patient Instructions for Blood Glucose Monitoring and Insulin/Medications Dosing Guide" in media tab for additional information. Please also refer to "Patient Self Inventory" in the Media tab for reviewed elements of pertinent patient history.  [name] [name] participated in the discussions, expressed understanding, and voiced agreement with the above plans.  All questions were answered to his satisfaction. he is encouraged to contact clinic should he have any questions or concerns prior to his return visit.     Follow up plan: - Return in about 3 months (around 01/05/2023) for Diabetes F/U with A1c in office, No previsit labs, Bring meter and logs.    Rayetta Pigg, [facility] [address] [place], [place] [address] Phone: [phone] Fax:  [phone]  10/06/2022, 11:54 AM

## 2022-10-08 ENCOUNTER — Ambulatory Visit: Payer: Medicare Other | Admitting: Nurse Practitioner

## 2022-10-09 ENCOUNTER — Ambulatory Visit: Payer: Medicare Other | Admitting: Internal Medicine

## 2022-10-09 ENCOUNTER — Encounter: Payer: Self-pay | Admitting: Internal Medicine

## 2022-10-09 VITALS — BP 136/72 | HR 74 | Temp 97.7°F | Ht 72.0 in | Wt 247.2 lb

## 2022-10-09 DIAGNOSIS — R0609 Other forms of dyspnea: Secondary | ICD-10-CM

## 2022-10-09 NOTE — Progress Notes (Unsigned)
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      [facility] 2021  Jan  covid x 17 days not on vent d/c on 02 and off it w/in 2 weeks   History of Present Illness  10/09/2022  Pulmonary/ 1st office eval/[facility] [place]  Chief Complaint  Patient presents with   Consult    SOB and feeling tired with exertion.  Some dizziness and tinnitus x 6 months   Dyspnea:  walking to shop is about 75 ft and gives out  Cough: none  Sleep: bed is flat one pillow SABA use: may have helped some  Prednisone may have helped but never 100%  Covid :  all but the last one  No obvious day to day or daytime pattern/variability or assoc excess/ purulent sputum or mucus plugs or hemoptysis or cp or chest tightness, subjective wheeze or overt sinus or hb symptoms.   Sleeping  without nocturnal  or early am exacerbation  of respiratory  c/o's or need for noct saba. Also denies any obvious fluctuation of symptoms with weather or environmental changes or other aggravating or alleviating factors except as outlined above   No unusual exposure hx or h/o childhood pna/ asthma or knowledge of premature birth.  Current Allergies, Complete Past Medical History, Past Surgical History, Family History, and Social History were reviewed in [employer] record.  ROS  The following are not active complaints unless bolded Hoarseness, sore throat, dysphagia, dental problems, itching, sneezing,  nasal congestion or discharge of excess mucus or purulent secretions, ear ache,   fever, chills, sweats, unintended wt loss or wt gain, classically pleuritic or exertional cp,  orthopnea pnd or arm/hand swelling  or leg  swelling, presyncope, palpitations, abdominal pain, anorexia, nausea, vomiting, diarrhea  or change in bowel habits or change in bladder habits, change in stools or change in urine, dysuria, hematuria,  rash, arthralgias, visual complaints, headache, numbness, weakness or ataxia or problems with walking or coordination,  change in mood or  memory.             Past Medical History:  Diagnosis Date   Arthritis    Cholelithiasis    Coronary atherosclerosis of native coronary artery    a. s/p DES to RCA in 2005 b. cath in 03/2022 showing patent distal RCA stent with 90% stenosis along proximal-mid RCA treated with PCI/DES placement   Essential hypertension    Hyperlipidemia    OA (osteoarthritis)    Stroke ([place])    Left pons 6/11 - Aggrenox started   Testicular cancer ([place]) 09/28/1976   Type 2 diabetes mellitus ([facility])    Urolithiasis     Outpatient Medications Prior to Visit  Medication Sig Dispense Refill   albuterol (VENTOLIN HFA) 108 (90 Base) MCG/ACT inhaler SMARTSIG:2 Puff(s) Via Inhaler 4 Times Daily PRN     aspirin EC 81 MG tablet Take 1 tablet (81 mg total) by mouth daily. 90 tablet 3   clopidogrel (PLAVIX) 75 MG tablet Take 1 tablet (75 mg total) by mouth daily with breakfast. 90 tablet 3   fish oil-omega-3 fatty acids 1000 MG capsule Take 1  g by mouth 2 (two) times daily.     glucose blood (ONETOUCH VERIO) test strip 1 each by Other route daily. And lancets 1/day 100 each 3   metFORMIN (GLUCOPHAGE-XR) 500 MG 24 hr tablet TAKE 2 TABLETS BY MOUTH  DAILY (Patient taking differently: Take 500 mg by mouth in the morning and at bedtime.) 180 tablet 3   nitroGLYCERIN (NITROSTAT) 0.4 MG SL tablet DISSOLVE 1 TABLET UNDER THE TONGUE EVERY 5 MINUTES FOR 3 DOSES AS NEEDED. MAY REPEAT EVERY 5 MINUTES FOR UP TO 3 DOSES. (Patient taking differently: Place 0.4 mg under the tongue every 5 (five) minutes as needed for chest pain.) 25 tablet 3   Semaglutide, 1 MG/DOSE, 4 MG/3ML SOPN Inject 1 mg as  directed once a week. 9 mL 0   simvastatin (ZOCOR) 40 MG tablet TAKE 1 TABLET BY MOUTH IN  THE EVENING 90 tablet 3   pantoprazole (PROTONIX) 40 MG tablet Take 1 tablet (40 mg total) by mouth daily. (Patient not taking: Reported on 10/09/2022) 30 tablet 1   No facility-administered medications prior to visit.     Objective:     BP 136/72 (BP Location: Left Arm, Patient Position: Sitting, Cuff Size: Large)   Pulse 74   Temp 97.7 F (36.5 C) (Oral)   Ht 6' (1.829 m)   Wt 247 lb 3.2 oz (112.1 kg)   SpO2 99%   BMI 33.53 kg/m   SpO2: 99 %    Pleasant amb mod obese (by BMI)  wm nad    HEENT : Oropharynx  clear        NECK :  without  apparent JVD/ palpable Nodes/TM    LUNGS: no acc muscle use,  Nl contour chest which is clear to A and P bilaterally without cough on insp or exp maneuvers   CV:  RRR  no s3 or murmur or increase in P2, and no edema   ABD:  soft and nontender with nl inspiratory excursion in the supine position. No bruits or organomegaly appreciated   MS:  Nl gait/ ext warm without deformities Or obvious joint restrictions  calf tenderness, cyanosis or clubbing    SKIN: warm and dry without lesions    NEURO:  alert, approp, nl sensorium with  no motor or cerebellar deficits apparent.      I personally reviewed images and   impression as follows:  CXR:   09/10/22  Min reduce lung vol/ minimal L >R SSA bases no chf/ effusions or ILD       Assessment   DOE (dyspnea on exertion) Quit smoking  2011 at wt 240s Jan  2021  covid x 17 days not on vent d/c on 02 and off it w/in 2 weeks > then back to baseline ex tol Onset summer 2023  - LHC  04/16/22  nl pressures/ function/ successful PCI/stents  - PFT's  09/11/22 @ wt 240   FEV1 3.21 (89 % ) ratio 0.85  p 2 % improvement from saba p 0 prior to study with DLCO  19.61 (70%)   and FV curve nl  - 10/09/2022   Walked on RA  x  3  lap(s) =  approx 750  ft  @ mod fast  pace, stopped due to end of study with lowest 02  sats 97% s sob or cp  - rec: 10/09/2022 = regular sub max ex min 30 min daily and check sats at peak ex and f/u in 6 weeks to consider cpst  if not improving    No evidence of copd or significant asthma though he did sense some better p saba so reviewed:  - The proper method of use, as well as anticipated side effects, of a metered-dose inhaler were discussed and demonstrated to the patient using teach back method.    - Re SABA :  I spent extra time with pt today reviewing appropriate use of albuterol for prn use on exertion with the following points: 1) saba is for relief of sob that does not improve by walking a slower pace or resting but rather if the pt does not improve after trying this first. 2) If the pt is convinced, as many are, that saba helps recover from activity faster then it's easy to tell if this is the case by re-challenging : ie stop, take the inhaler, then p 5 minutes try the exact same activity (intensity of workload) that just caused the symptoms and see if they are substantially diminished or not after saba 3) if there is an activity that reproducibly causes the symptoms, try the saba 15 min before the activity on alternate days   If in fact the saba really does help, then fine to continue to use it prn but advised may need to look closer at the maintenance regimen (for now =0)  being used to achieve better control of airways disease with exertion.   >>> f/u in [place] with inhaler  in 6 weeks  Each maintenance medication was reviewed in detail including emphasizing most importantly the difference between maintenance and prns and under what circumstances the prns are to be triggered using an action plan format where appropriate.  Total time for H and P, chart review, counseling, reviewing hfa  device(s) , directly observing portions of ambulatory 02 saturation study/ and generating customized AVS unique to this office visit / same day charting = 45 min with pt new to me                    Christinia Gully, MD 10/09/2022

## 2022-10-09 NOTE — Patient Instructions (Addendum)
Only use your albuterol as a rescue medication to be used if you can't catch your breath by resting or doing a relaxed purse lip breathing pattern.  - The less you use it, the better it will work when you need it. - Ok to use up to 2 puffs  every 4 hours if you must but call for immediate appointment if use goes up over your usual need - Don't leave home without it !!  (think of it like starter fluid for your car)    Also  Ok to try albuterol 15 min before an activity (on alternating days)  that you know would usually make you short of breath and see if it makes any difference and if makes none then don't take albuterol after activity unless you can't catch your breath as this means it's the resting that helps, not the albuterol.  To get the most out of exercise, you need to be continuously aware that you are short of breath, but never out of breath, for at least 30 minutes daily. As you improve, it will actually be easier for you to do the same amount of exercise  in  30 minutes so always push to the level where you are short of breath.  Once you can do this, push for longer duration or repeat it after at least 4 hours of rest.  Make sure you check your oxygen saturations at highest level of activity    Please schedule a follow up office visit in 6 weeks, call sooner if needed  - IN [place] OFFICE - bring inhaler      .

## 2022-10-09 NOTE — Assessment & Plan Note (Addendum)
Quit smoking  2011 at wt 240s Jan  2021  covid x 17 days not on vent d/c on 02 and off it w/in 2 weeks > then back to baseline ex tol Onset summer 2023  - LHD  04/16/22  nl pressures/ function/ successful PCI/stents  - PFT's  09/11/22 @ wt 240   FEV1 3.21 (89 % ) ratio 0.85  p 2 % improvement from saba p 0 prior to study with DLCO  19.61 (70%)   and FV curve nl  - 10/09/2022   Walked on RA  x  3  lap(s) =  approx 750  ft  @ mod fast  pace, stopped due to end of study with lowest 02 sats 97% s sob or cp  - rec: 10/09/2022 = regular sub max ex min 30 min daily and check sats at peak ex and f/u in 6 weeks to consider cpst if not improving    No evidence of copd or significant asthma though he did sense some better p saba so reviewed:  - The proper method of use, as well as anticipated side effects, of a metered-dose inhaler were discussed and demonstrated to the patient using teach back method.    - Re SABA :  I spent extra time with pt today reviewing appropriate use of albuterol for prn use on exertion with the following points: 1) saba is for relief of sob that does not improve by walking a slower pace or resting but rather if the pt does not improve after trying this first. 2) If the pt is convinced, as many are, that saba helps recover from activity faster then it's easy to tell if this is the case by re-challenging : ie stop, take the inhaler, then p 5 minutes try the exact same activity (intensity of workload) that just caused the symptoms and see if they are substantially diminished or not after saba 3) if there is an activity that reproducibly causes the symptoms, try the saba 15 min before the activity on alternate days   If in fact the saba really does help, then fine to continue to use it prn but advised may need to look closer at the maintenance regimen (for now =0)  being used to achieve better control of airways disease with exertion.   >>> f/u in [place] with inhaler  in 6  weeks  Each maintenance medication was reviewed in detail including emphasizing most importantly the difference between maintenance and prns and under what circumstances the prns are to be triggered using an action plan format where appropriate.  Total time for H and P, chart review, counseling, reviewing hfa  device(s) , directly observing portions of ambulatory 02 saturation study/ and generating customized AVS unique to this office visit / same day charting = 45 min with pt new to me

## 2022-10-10 ENCOUNTER — Encounter: Payer: Self-pay | Admitting: Internal Medicine

## 2022-10-13 ENCOUNTER — Telehealth: Payer: Self-pay | Admitting: *Deleted

## 2022-10-13 ENCOUNTER — Other Ambulatory Visit: Payer: Self-pay | Admitting: Internal Medicine

## 2022-10-13 ENCOUNTER — Other Ambulatory Visit: Payer: Self-pay | Admitting: Nurse Practitioner

## 2022-10-13 ENCOUNTER — Other Ambulatory Visit: Payer: Self-pay

## 2022-10-13 MED ORDER — NITROGLYCERIN 0.4 MG SL SUBL: SUBLINGUAL_TABLET | SUBLINGUAL | 3 refills | Status: AC

## 2022-10-13 MED ORDER — CLOPIDOGREL BISULFATE 75 MG PO TABS: 75.0000 mg | ORAL_TABLET | Freq: Every day | ORAL | 3 refills | Status: DC

## 2022-10-13 MED ORDER — SIMVASTATIN 40 MG PO TABS: 40.0000 mg | ORAL_TABLET | Freq: Every evening | ORAL | 3 refills | Status: DC

## 2022-10-13 MED ORDER — ACCU-CHEK GUIDE ME W/DEVICE KIT: PACK | 0 refills | Status: DC

## 2022-10-13 MED ORDER — ONETOUCH VERIO W/DEVICE KIT: PACK | 0 refills | Status: AC

## 2022-10-13 NOTE — Telephone Encounter (Signed)
I called and talked with [name] [name] letting her know that the Glucose meter had been call into the patient's pharmacy.

## 2022-10-13 NOTE — Telephone Encounter (Signed)
I sent in for Accuchek guide but I have no idea if his insurance prefers that meter or not.

## 2022-10-13 NOTE — Telephone Encounter (Signed)
Patient's wife, [name] [name] , left a message stating that she had left a message last week requesting a new glucose meter.monitor for the patient  I called her back to make sure that I understood correctly and to verify the correct pharmacy to send the request to. She ask that it be sent to [facility] in [place] . I shared with her that a new monitor may also need new strips,ect. And she was okay with this.

## 2022-10-14 ENCOUNTER — Other Ambulatory Visit: Payer: Self-pay | Admitting: Nurse Practitioner

## 2022-10-14 ENCOUNTER — Other Ambulatory Visit: Payer: Self-pay

## 2022-10-14 DIAGNOSIS — E1151 Type 2 diabetes mellitus with diabetic peripheral angiopathy without gangrene: Secondary | ICD-10-CM

## 2022-10-14 MED ORDER — ONETOUCH VERIO VI STRP: 1.0000 | ORAL_STRIP | Freq: Two times a day (BID) | 3 refills | Status: DC

## 2022-10-14 MED ORDER — SEMAGLUTIDE (1 MG/DOSE) 4 MG/3ML ~~LOC~~ SOPN: 1.0000 mg | PEN_INJECTOR | SUBCUTANEOUS | 0 refills | Status: DC

## 2022-10-14 MED ORDER — METFORMIN HCL ER 500 MG PO TB24: 1000.0000 mg | ORAL_TABLET | Freq: Two times a day (BID) | ORAL | 0 refills | Status: DC

## 2022-10-14 MED ORDER — LANCETS MISC: 1.0000 | Freq: Two times a day (BID) | 3 refills | Status: DC

## 2022-10-27 ENCOUNTER — Telehealth: Payer: Self-pay | Admitting: *Deleted

## 2022-10-27 ENCOUNTER — Other Ambulatory Visit (HOSPITAL_COMMUNITY): Payer: Self-pay

## 2022-10-27 ENCOUNTER — Telehealth: Payer: Self-pay | Admitting: Pharmacy Technician

## 2022-10-27 NOTE — Telephone Encounter (Signed)
Pharmacy Patient Advocate Encounter   Received notification from Pt calls msg that prior authorization for Ozempic '1mg'$  is required/requested.   Started PA on 10/27/22 to (ins) [employer] [place] via [employer]. Status pending.

## 2022-10-27 NOTE — Telephone Encounter (Signed)
Patients wife left a voicemail.. She says that the patient is without his medication and the pharmacy, [facility] in [place], are telling them that our office is not responding to the the change in prescription. I called [facility] ,they stated that the patient request for a PA had been sent to our office previously.  Patient's wife was called and made aware that this request was for a PA, and that this was being worked on by the PA team , time turn around more than a week. Also, I would sent the team a message for a followup on it.

## 2022-10-28 ENCOUNTER — Other Ambulatory Visit: Payer: Self-pay

## 2022-10-28 NOTE — Telephone Encounter (Signed)
Patient Advocate Encounter  Prior Authorization for Ozempic (1 MG/DOSE) '4MG'$ /3ML pen-injectors has been approved through [employer]: [id number]   Effective: 10-27-2022 to 10-28-2023

## 2022-11-05 ENCOUNTER — Telehealth: Payer: Self-pay | Admitting: *Deleted

## 2022-11-05 NOTE — Telephone Encounter (Signed)
Patient's wife called and states that his blood sugars are running high. At his office visit January 9,20204  the patient was taken off 3 of his medications and the Ozempic was prescribed. His first injection of the Ozempic was on February 3rd. He is also taking Metformin 500 mg twice a day with a meal.  He has been checking his blood sugars once a day before breakfast. On 02/06 it was 248 , 02/07 it was 220 ,and this morning it was 210. His wife said that he did not take his 2nd dose of Metformin with his meal he waited a while because his sugar was high. He retook the blood sugar and it was 197 just before bed.  The highest blood sugar has been is 248 and the lowest has been 161.  Patient has started walking, and watching what he eats.  They wanted to make provider aware and see if there was something else that they should be doing.

## 2022-11-05 NOTE — Telephone Encounter (Signed)
I called and talked with the patient's wife, [name] [name]. I shared [name]'s recommendations with her. They were appreciated. She will call back if his blood sugars are 300 are more. I told her that if they had further questions and or concerns to please call us.

## 2022-11-05 NOTE — Telephone Encounter (Signed)
No, he is doing all he should be at this time.  Continue both the Ozempic and the Metformin as prescribed.  It takes time (up to 6 weeks) for the Ozempic to get in his system before you really start seeing a difference in his glucose readings.  And although his glucose readings are higher than we like, he is not in the danger zone as of yet and there is improvement even (going from 248 fasting to 210 fasting) over the last few days.

## 2022-11-22 NOTE — Progress Notes (Unsigned)
[name] [name], male    DOB: 1955-12-11   MRN: [id number]   Brief patient profile:  55 yowm retired truck driver  quit smoking 2011 cause worried about his CAD at wt 76s referred to pulmonary clinic 10/09/2022 by Dr [name] [name]  for new pattern of doe/cp since summer 2023 > stents did not correct the breathing though cp's resolved then even worse with URI Nov 2023.      [facility] 2021  Jan  covid x 17 days not on vent d/c on 02 and off it w/in 2 weeks   History of Present Illness  10/09/2022  Pulmonary/ 1st office eval/[name] [name]  Chief Complaint  Patient presents with   Consult    SOB and feeling tired with exertion.  Some dizziness and tinnitus x 6 months   Dyspnea:  walking to shop is about 75 ft and gives out  Cough: none  Sleep: bed is flat one pillow SABA use: may have helped some  Prednisone may have helped but never 100%  Covid :  all but the last one Rec Only use your albuterol as a rescue medication  Also  Ok to try albuterol 15 min before an activity (on alternating days)  that you know would usually make you short of breath To get the most out of exercise, you need to be continuously aware that you are short of breath, but never out of breath, for at least 30 minutes daily. Make sure you check your oxygen saturations at highest level of activity  Please schedule a follow up office visit in 6 weeks, call sooner if needed  - IN [place] OFFICE - bring inhaler   11/23/2022  f/u ov/[name] [name] re: ***   maint on ***  No chief complaint on file.   Dyspnea:  *** Cough: *** Sleeping: *** SABA use: *** 02: *** Covid status:   *** Lung cancer screening :  ***    No obvious day to day or daytime variability or assoc excess/ purulent sputum or mucus plugs or hemoptysis or cp or chest tightness, subjective wheeze or overt sinus or hb symptoms.   *** without nocturnal  or early am exacerbation  of respiratory  c/o's or need for noct saba. Also denies any obvious fluctuation of symptoms with  weather or environmental changes or other aggravating or alleviating factors except as outlined above   No unusual exposure hx or h/o childhood pna/ asthma or knowledge of premature birth.  Current Allergies, Complete Past Medical History, Past Surgical History, Family History, and Social History were reviewed in [employer] record.  ROS  The following are not active complaints unless bolded Hoarseness, sore throat, dysphagia, dental problems, itching, sneezing,  nasal congestion or discharge of excess mucus or purulent secretions, ear ache,   fever, chills, sweats, unintended wt loss or wt gain, classically pleuritic or exertional cp,  orthopnea pnd or arm/hand swelling  or leg swelling, presyncope, palpitations, abdominal pain, anorexia, nausea, vomiting, diarrhea  or change in bowel habits or change in bladder habits, change in stools or change in urine, dysuria, hematuria,  rash, arthralgias, visual complaints, headache, numbness, weakness or ataxia or problems with walking or coordination,  change in mood or  memory.        No outpatient medications have been marked as taking for the 11/23/22 encounter (Appointment) with [name] Rockers, MD.               Past Medical History:  Diagnosis Date  Arthritis    Cholelithiasis    Coronary atherosclerosis of native coronary artery    a. s/p DES to RCA in 2005 b. cath in 03/2022 showing patent distal RCA stent with 90% stenosis along proximal-mid RCA treated with PCI/DES placement   Essential hypertension    Hyperlipidemia    OA (osteoarthritis)    Stroke ([place])    Left pons 6/11 - Aggrenox started   Testicular cancer ([place]) 09/28/1976   Type 2 diabetes mellitus ([place])    Urolithiasis          Objective:    Wt Readings from Last 3 Encounters:  10/09/22 247 lb 3.2 oz (112.1 kg)  10/06/22 246 lb 3.2 oz (111.7 kg)  08/12/22 253 lb (114.8 kg)      Vital signs reviewed  11/23/2022  - Note at rest 02 sats   ***% on ***   General appearance:    ***          Assessment

## 2022-11-23 ENCOUNTER — Ambulatory Visit: Payer: Medicare Other | Admitting: Internal Medicine

## 2022-11-23 ENCOUNTER — Encounter: Payer: Self-pay | Admitting: Internal Medicine

## 2022-11-23 VITALS — BP 134/76 | HR 72 | Ht 72.0 in | Wt 250.6 lb

## 2022-11-23 DIAGNOSIS — H9313 Tinnitus, bilateral: Secondary | ICD-10-CM | POA: Diagnosis not present

## 2022-11-23 DIAGNOSIS — Z87891 Personal history of nicotine dependence: Secondary | ICD-10-CM

## 2022-11-23 DIAGNOSIS — R0609 Other forms of dyspnea: Secondary | ICD-10-CM | POA: Diagnosis not present

## 2022-11-23 NOTE — Assessment & Plan Note (Addendum)
Quit smoking 2011  Low-dose CT lung cancer screening is recommended for patients who are 76-69 years of age with a 20+ pack-year history of smoking and who are currently smoking or quit <=15 years ago. No coughing up blood  No unintentional weight loss of > 15 pounds in the last 6 months - pt is eligible for scanning yearly until 2026   Discussed in detail all the  indications, usual  risks and alternatives  relative to the benefits with patient who agrees to proceed with w/u as outlined.

## 2022-11-23 NOTE — Patient Instructions (Signed)
Also  Ok to try albuterol 15 min before an activity (on alternating days)  that you know would usually make you short of breath and see if it makes any difference and if makes none then don't take albuterol after activity unless you can't catch your breath as this means it's the resting that helps, not the albuterol.      My office will be contacting you by phone for referral to lung cancer screening and ENT   - if you don't hear back from my office within one week please call us back or notify us thru MyChart and we'll address it right away.   Please schedule a follow up visit in 6 months but call sooner if needed

## 2022-11-23 NOTE — Assessment & Plan Note (Addendum)
Referred to ENT 11/23/2022 >>>   - Nl exam/ assoc sujective hearling loss but no  vertigo or significant asa exp.   Each maintenance medication was reviewed in detail including emphasizing most importantly the difference between maintenance and prns and under what circumstances the prns are to be triggered using an action plan format where appropriate.  Total time for H and P, chart review, counseling, reviewing hfa  device(s) and generating customized AVS unique to this office visit / same day charting = > 30 min for multiple  refractory symptoms of uncertain etiology

## 2022-11-23 NOTE — Assessment & Plan Note (Signed)
Quit smoking  2011 at wt 240s Jan  2021  covid x 17 days not on vent d/c on 02 and off it w/in 2 weeks Onset summer 2023  - [facility] 04/16/22  nl pressures/ function/ successful PCI/stents  - PFT's  09/11/22 @ wt 240   FEV1 3.21 (89 % ) ratio 0.85  p 2 % improvement from saba p 0 prior to study with DLCO  19.61 (70%)   and FV curve nl  - 10/09/2022   Walked on RA  x  3  lap(s) =  approx 750  ft  @ mod fast  pace, stopped due to end of study with lowest 02 sats 97% s sob or cp  - rec: 10/09/2022 = regular sub max ex min 30 min daily and check sats at peak ex and f/u in 6 weeks to consider cpst if not improving   - 11/23/2022  After extensive coaching inhaler device,  effectiveness =    80%   Again advise: Re SABA :  I spent extra time with pt today reviewing appropriate use of albuterol for prn use on exertion with the following points: 1) saba is for relief of sob that does not improve by walking a slower pace or resting but rather if the pt does not improve after trying this first. 2) If the pt is convinced, as many are, that saba helps recover from activity faster then it's easy to tell if this is the case by re-challenging : ie stop, take the inhaler, then p 5 minutes try the exact same activity (intensity of workload) that just caused the symptoms and see if they are substantially diminished or not after saba 3) if there is an activity that reproducibly causes the symptoms, try the saba 15 min before the activity on alternate days   If in fact the saba really does help, then fine to continue to use it prn but advised may need to look closer at the maintenance regimen (now zero but could consider low dose symbicort bid) being used to achieve better control of airways disease with exertion.

## 2022-11-23 NOTE — Addendum Note (Signed)
Addended by: Christinia Gully B on: 11/23/2022 10:21 AM   Modules accepted: Level of Service

## 2022-11-26 ENCOUNTER — Encounter: Payer: Self-pay | Admitting: Internal Medicine

## 2022-12-29 ENCOUNTER — Ambulatory Visit: Payer: Medicare Other | Attending: Cardiology | Admitting: Cardiology

## 2022-12-29 ENCOUNTER — Encounter: Payer: Self-pay | Admitting: Cardiology

## 2022-12-29 VITALS — BP 138/88 | HR 71 | Ht 72.0 in | Wt 250.4 lb

## 2022-12-29 DIAGNOSIS — I25119 Atherosclerotic heart disease of native coronary artery with unspecified angina pectoris: Secondary | ICD-10-CM

## 2022-12-29 DIAGNOSIS — E782 Mixed hyperlipidemia: Secondary | ICD-10-CM | POA: Diagnosis not present

## 2022-12-29 DIAGNOSIS — I10 Essential (primary) hypertension: Secondary | ICD-10-CM

## 2022-12-29 NOTE — Progress Notes (Signed)
    Cardiology Office Note  Date: 12/29/2022   ID: [name] [name], DOB 1955/12/16, MRN [id number]  History of Present Illness: [name] [name] is a 67 y.o. male last seen in November 2023 by Ms. Strader PA-C, I reviewed the note.  He is here with his wife for a follow-up visit.  Reports no angina or nitroglycerin use in the interim.  Has had some shortness of breath with activity, working on weight loss and exercise plan now, also saw Dr. Melvyn Novas in February.  He is following with [facility] at this time as well.  I reviewed his medications.  We discussed stopping Plavix at this point (80-month DAPT recommended by Dr. Martinique at last PCI).  He is tolerating Zocor with LDL 54 in November 2023.  Physical Exam: VS:  BP 138/88   Pulse 71   Ht 6' (1.829 m)   Wt 250 lb 6.4 oz (113.6 kg)   SpO2 96%   BMI 33.96 kg/m , BMI Body mass index is 33.96 kg/m.  Wt Readings from Last 3 Encounters:  12/29/22 250 lb 6.4 oz (113.6 kg)  11/23/22 250 lb 9.6 oz (113.7 kg)  10/09/22 247 lb 3.2 oz (112.1 kg)    General: Patient appears comfortable at rest. HEENT: Conjunctiva and lids normal. Neck: Supple, no elevated JVP or carotid bruits. Lungs: Clear to auscultation, nonlabored breathing at rest. Cardiac: Regular rate and rhythm, no S3 or significant systolic murmur. Extremities: No pitting edema.  ECG:  An ECG dated 05/01/2022 was personally reviewed today and demonstrated:  Sinus rhythm.  Labwork: 08/12/2022: ALT 32; AST 24; BUN 22; Creatinine, Ser 1.08; Hemoglobin 15.8; Platelets 128; Potassium 4.0; Sodium 138     Component Value Date/Time   CHOL 151 08/12/2022 1502   TRIG 211 (H) 08/12/2022 1502   TRIG 96 07/09/2006 1049   HDL 55 08/12/2022 1502   CHOLHDL 2.7 08/12/2022 1502   VLDL 42 (H) 08/12/2022 1502   LDLCALC 54 08/12/2022 1502   LDLDIRECT 57.0 04/01/2018 1408   Other Studies Reviewed Today:  No interval cardiac testing for review today.  Assessment and Plan:  1.  CAD  status post DES to the RCA in 2005 and DES to the RCA in July 2023.  Symptomatically stable without active angina or nitroglycerin use.  Agree with plan for exercise and weight loss.  Stopping Plavix at this point.  Continue aspirin, Zocor, and as needed nitroglycerin.  2.  Mixed hyperlipidemia.  Tolerating Zocor 40 mg daily with recent LDL 54.  3.  Essential hypertension.  No change in current regimen.  4.  Type 2 diabetes mellitus now following with [facility].  He is on Ozempic and Glucophage XR.  Disposition:  Follow up  6 months.  Signed, Satira Sark, M.D., F.A.C.C. [place] at [facility]

## 2022-12-29 NOTE — Patient Instructions (Signed)
Medication Instructions:  STOP Plavis when you finish what yu have.  Labwork: None today  Testing/Procedures: None today  Follow-Up: 6 months  Any Other Special Instructions Will Be Listed Below (If Applicable).  If you need a refill on your cardiac medications before your next appointment, please call your pharmacy.

## 2022-12-30 ENCOUNTER — Encounter: Payer: Self-pay | Admitting: Gastroenterology

## 2023-01-05 ENCOUNTER — Ambulatory Visit: Payer: Medicare Other | Admitting: Nurse Practitioner

## 2023-01-05 ENCOUNTER — Encounter: Payer: Self-pay | Admitting: Nurse Practitioner

## 2023-01-05 VITALS — BP 130/80 | HR 76 | Ht 72.0 in | Wt 250.4 lb

## 2023-01-05 DIAGNOSIS — E1151 Type 2 diabetes mellitus with diabetic peripheral angiopathy without gangrene: Secondary | ICD-10-CM

## 2023-01-05 LAB — POCT GLYCOSYLATED HEMOGLOBIN (HGB A1C): Hemoglobin A1C: 8.8 % — AB (ref 4.0–5.6)

## 2023-01-05 MED ORDER — SEMAGLUTIDE (2 MG/DOSE) 8 MG/3ML ~~LOC~~ SOPN: 2.0000 mg | PEN_INJECTOR | SUBCUTANEOUS | 3 refills | Status: DC

## 2023-01-05 NOTE — Progress Notes (Signed)
Endocrinology Consult Note       01/05/2023, 12:18 PM   Subjective:    Patient ID: [name] [name], male    DOB: September 12, 1956.  [name] [name] is being seen in consultation for management of currently uncontrolled symptomatic diabetes requested by  Richardean Chimera, MD.   Past Medical History:  Diagnosis Date   Arthritis    Cholelithiasis    Coronary atherosclerosis of native coronary artery    a. s/p DES to RCA in 2005 b. 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   Type 2 diabetes mellitus    Urolithiasis     Past Surgical History:  Procedure Laterality Date   Colonoscopy     CORONARY STENT INTERVENTION N/A 04/16/2022   Procedure: CORONARY STENT INTERVENTION;  Surgeon: Swaziland, Peter M, MD;  Location: [facility] INVASIVE CV LAB;  Service: Cardiovascular;  Laterality: N/A;   CORONARY STENT PLACEMENT  2005   LEFT HEART CATH AND CORONARY ANGIOGRAPHY N/A 04/16/2022   Procedure: LEFT HEART CATH AND CORONARY ANGIOGRAPHY;  Surgeon: Swaziland, Peter M, MD;  Location: [facility] INVASIVE CV LAB;  Service: Cardiovascular;  Laterality: N/A;   Testicular tumor resection     Right side    Social History   Socioeconomic History   Marital status: Married    Spouse name: [name]   Number of children: 0   Years of education: Not on file   Highest education level: Not on file  Occupational History   Occupation: Truck driver  Tobacco Use   Smoking status: Former    Packs/day: 1.50    Years: 43.00    Additional pack years: 0.00    Total pack years: 64.50    Types: Cigarettes    Quit date: 04/21/2010    Years since quitting: 12.7   Smokeless tobacco: Never  Vaping Use   Vaping Use: Never used  Substance and Sexual  Activity   Alcohol use: Yes    Comment: occasionally   Drug use: No   Sexual activity: Not on file  Other Topics Concern   Not on file  Social History Narrative   Not on file   Social Determinants of Health   Financial Resource Strain: Not on file  Food Insecurity: Not on file  Transportation Needs: Not on file  Physical Activity: Not on file  Stress: Not on file  Social Connections: Not on file    Family History  Problem Relation Age of Onset   Heart disease Father    Diabetes Father    Heart attack Father    Colon cancer Neg Hx    Esophageal cancer Neg Hx    Rectal cancer Neg Hx    Stomach cancer Neg Hx    Colon polyps Neg Hx     Outpatient Encounter Medications as of 01/05/2023  Medication Sig   albuterol (VENTOLIN HFA) 108 (90 Base) MCG/ACT inhaler SMARTSIG:2 Puff(s) Via Inhaler 4 Times Daily PRN   aspirin  EC 81 MG tablet Take 1 tablet (81 mg total) by mouth daily.   Blood Glucose Monitoring Suppl (ACCU-CHEK GUIDE ME) w/Device KIT Use to monitor glucose twice daily.   Blood Glucose Monitoring Suppl (ONETOUCH VERIO) w/Device KIT Use to test BG qid. E11.65   fish oil-omega-3 fatty acids 1000 MG capsule Take 1 g by mouth 2 (two) times daily.   glucose blood (ONETOUCH VERIO) test strip 1 each by Other route 2 (two) times daily.   Lancets MISC 1 each by Does not apply route in the morning and at bedtime.   metFORMIN (GLUCOPHAGE-XR) 500 MG 24 hr tablet Take 1 tablet (500 mg total) by mouth 2 (two) times daily with a meal.   nitroGLYCERIN (NITROSTAT) 0.4 MG SL tablet DISSOLVE 1 TABLET UNDER THE TONGUE EVERY 5 MINUTES FOR 3 DOSES AS NEEDED. MAY REPEAT EVERY 5 MINUTES FOR UP TO 3 DOSES.   Semaglutide, 2 MG/DOSE, 8 MG/3ML SOPN Inject 2 mg as directed once a week.   simvastatin (ZOCOR) 40 MG tablet Take 1 tablet (40 mg total) by mouth every evening.   [DISCONTINUED] Semaglutide, 1 MG/DOSE, 4 MG/3ML SOPN Inject 1 mg as directed once a week.   pantoprazole (PROTONIX) 40 MG tablet  Take 1 tablet (40 mg total) by mouth daily.   No facility-administered encounter medications on file as of 01/05/2023.    ALLERGIES: No Known Allergies  VACCINATION STATUS: Immunization History  Administered Date(s) Administered   Pneumococcal Polysaccharide-23 07/15/2009   Tdap 03/05/2017   Zoster, Live 02/10/2016    Diabetes He presents for his follow-up diabetic visit. He has type 2 diabetes mellitus. Onset time: diagnosed at approx age of 37. His disease course has been worsening. There are no hypoglycemic associated symptoms. There are no hypoglycemic complications. Diabetic complications include a CVA and heart disease (CAD). Risk factors for coronary artery disease include diabetes mellitus, dyslipidemia, family history, obesity, male sex, hypertension and sedentary lifestyle. Current diabetic treatment includes oral agent (monotherapy) and diet (Ozempic). He is compliant with treatment most of the time. His weight is fluctuating minimally. He is following a generally unhealthy diet. When asked about meal planning, he reported none. He has not had a previous visit with a dietitian. He rarely participates in exercise. His home blood glucose trend is fluctuating minimally. His breakfast blood glucose range is generally >200 mg/dl. (He presents today, accompanied by his wife, with his logs showing fasting hyperglycemia.  His POCT A1c today is 8.8%, increasing from last visit of 6.8%.  He notes he has made some positive efforts with his diet, has cut back on diet mt dew, candies, concentrated sweets, etc.  He denies any hypoglycemia.  He also notes he was out of his Ozempic for a week or more due to medication shortage.) An ACE inhibitor/angiotensin II receptor blocker is not being taken. He sees a podiatrist.Eye exam is current.     Review of systems  Constitutional: + Minimally fluctuating body weight, current Body mass index is 33.96 kg/m., no fatigue, no subjective hyperthermia, no  subjective hypothermia Eyes: no blurry vision, no xerophthalmia ENT: no sore throat, no nodules palpated in throat, no dysphagia/odynophagia, no hoarseness Cardiovascular: no chest pain, no shortness of breath, no palpitations, no leg swelling Respiratory: no cough, no shortness of breath Gastrointestinal: no nausea/vomiting/diarrhea Musculoskeletal: no muscle/joint aches Skin: no rashes, no hyperemia Neurological: no tremors, no numbness, no tingling, no dizziness Psychiatric: no depression, no anxiety  Objective:     BP 130/80 (BP Location: Right Arm,  Patient Position: Sitting, Cuff Size: Large) Comment: Retake- manuel cuff  Pulse 76   Ht 6' (1.829 m)   Wt 250 lb 6.4 oz (113.6 kg)   BMI 33.96 kg/m   Wt Readings from Last 3 Encounters:  01/05/23 250 lb 6.4 oz (113.6 kg)  12/29/22 250 lb 6.4 oz (113.6 kg)  11/23/22 250 lb 9.6 oz (113.7 kg)     BP Readings from Last 3 Encounters:  01/05/23 130/80  12/29/22 138/88  11/23/22 134/76     Physical Exam- Limited  Constitutional:  Body mass index is 33.96 kg/m. , not in acute distress, normal state of mind Eyes:  EOMI, no exophthalmos Musculoskeletal: no gross deformities, strength intact in all four extremities, no gross restriction of joint movements Skin:  no rashes, no hyperemia Neurological: no tremor with outstretched hands   Diabetic Foot Exam - Simple   Simple Foot Form Diabetic Foot exam was performed with the following findings: Yes 01/05/2023 11:04 AM  Visual Inspection No deformities, no ulcerations, no other skin breakdown bilaterally: Yes Sensation Testing Intact to touch and monofilament testing bilaterally: Yes Pulse Check Posterior Tibialis and Dorsalis pulse intact bilaterally: Yes Comments     CMP ( most recent) CMP     Component Value Date/Time   NA 138 08/12/2022 1502   K 4.0 08/12/2022 1502   CL 105 08/12/2022 1502   CO2 26 08/12/2022 1502   GLUCOSE 141 (H) 08/12/2022 1502   GLUCOSE 107 (H)  07/09/2006 1049   BUN 22 08/12/2022 1502   CREATININE 1.08 08/12/2022 1502   CALCIUM 9.2 08/12/2022 1502   PROT 7.0 08/12/2022 1502   ALBUMIN 3.9 08/12/2022 1502   AST 24 08/12/2022 1502   ALT 32 08/12/2022 1502   ALKPHOS 55 08/12/2022 1502   BILITOT 0.5 08/12/2022 1502   GFRNONAA >60 08/12/2022 1502   GFRAA >60 10/26/2015 1936     Diabetic Labs (most recent): Lab Results  Component Value Date   HGBA1C 8.8 (A) 01/05/2023   HGBA1C 6.8 (H) 08/12/2022   HGBA1C 7.2 (A) 11/27/2021   MICROALBUR <0.7 04/01/2018   MICROALBUR <0.7 03/05/2017   MICROALBUR <0.7 02/10/2016     Lipid Panel ( most recent) Lipid Panel     Component Value Date/Time   CHOL 151 08/12/2022 1502   TRIG 211 (H) 08/12/2022 1502   TRIG 96 07/09/2006 1049   HDL 55 08/12/2022 1502   CHOLHDL 2.7 08/12/2022 1502   VLDL 42 (H) 08/12/2022 1502   LDLCALC 54 08/12/2022 1502   LDLDIRECT 57.0 04/01/2018 1408      Lab Results  Component Value Date   TSH 1.18 07/18/2021   TSH 1.27 01/26/2020   TSH 2.34 04/01/2018   TSH 2.14 03/05/2017   TSH 1.27 02/10/2016   TSH 1.35 11/02/2014   TSH 1.64 05/19/2013   TSH 1.21 12/16/2011   TSH 1.27 08/01/2010   TSH 1.39 06/13/2009   FREET4 0.70 07/18/2021           Assessment & Plan:   1) Type 2 Diabetes mellitus without complication without long-term current use of insulin  He presents today, accompanied by his wife, with his logs showing fasting hyperglycemia.  His POCT A1c today is 8.8%, increasing from last visit of 6.8%.  He notes he has made some positive efforts with his diet, has cut back on diet mt dew, candies, concentrated sweets, etc.  He denies any hypoglycemia.  He also notes he was out of his Ozempic for a week or  more due to medication shortage.  [name] [name] has currently uncontrolled symptomatic type 2 DM since 67 years of age.   -Recent labs reviewed.  - I had a long discussion with him about the progressive nature of diabetes and the  pathology behind its complications. -his diabetes is complicated by CAD with stents, CVA and he remains at a high risk for more acute and chronic complications which include CAD, CVA, CKD, retinopathy, and neuropathy. These are all discussed in detail with him.  The following Lifestyle Medicine recommendations according to American College of Lifestyle Medicine [facility](ACLM) were discussed and offered to patient and he agrees to start the journey:  A. Whole Foods, Plant-based plate comprising of fruits and vegetables, plant-based proteins, whole-grain carbohydrates was discussed in detail with the patient.   A list for source of those nutrients were also provided to the patient.  Patient will use only water or unsweetened tea for hydration. B.  The need to stay away from risky substances including alcohol, smoking; obtaining 7 to 9 hours of restorative sleep, at least 150 minutes of moderate intensity exercise weekly, the importance of healthy social connections,  and stress reduction techniques were discussed. C.  A full color page of  Calorie density of various food groups per pound showing examples of each food groups was provided to the patient.  - Nutritional counseling repeated at each appointment due to patients tendency to fall back in to old habits.  - The patient admits there is a room for improvement in their diet and drink choices. -  Suggestion is made for the patient to avoid simple carbohydrates from their diet including Cakes, Sweet Desserts / Pastries, Ice Cream, Soda (diet and regular), Sweet Tea, Candies, Chips, Cookies, Sweet Pastries, Store Bought Juices, Alcohol in Excess of 1-2 drinks a day, Artificial Sweeteners, Coffee Creamer, and "Sugar-free" Products. This will help patient to have stable blood glucose profile and potentially avoid unintended weight gain.   - I encouraged the patient to switch to unprocessed or minimally processed complex starch and increased protein intake (animal  or plant source), fruits, and vegetables.   - Patient is advised to stick to a routine mealtimes to eat 3 meals a day and avoid unnecessary snacks (to snack only to correct hypoglycemia).  - I have approached him with the following individualized plan to manage his diabetes and patient agrees:   -he is encouraged to start monitoring glucose once daily, before breakfast and to call the clinic if he has readings less than 70 or above 200 for 3 tests in a row.  - Adjustment parameters are given to him for hypo and hyperglycemia in writing.  - he is advised to continue Metformin 500 md XR twice daily with meals, therapeutically suitable for patient.  Will increase his Ozempic to 2 mg SQ weekly.  I encouraged him to reach out if fasting glucose is still over 200 in about 4 weeks to discuss a plan b.  Thinking of trying low dose sulfonylurea.  We did talk more about continuing to adjust his diet so that more meds are not needed.  - his Actos, Parlodel, and Jardiance will be discontinued, risk outweighs benefit for this patient.  He does not have significant CKD or CHF.  - Specific targets for  A1c; LDL, HDL, and Triglycerides were discussed with the patient.  2) Blood Pressure /Hypertension:  his blood pressure is controlled to target without the use of antihypertensive medications.     3)  Lipids/Hyperlipidemia:    Review of his recent lipid panel from 08/12/22 showed controlled LDL at 54 and elevated triglycerides of 211.  he is advised to continue Simvastatin 40 mg daily at bedtime.  Side effects and precautions discussed with him.  4)  Weight/Diet:  his Body mass index is 33.96 kg/m.  -  clearly complicating his diabetes care.   he is a candidate for weight loss. I discussed with him the fact that loss of 5 - 10% of his  current body weight will have the most impact on his diabetes management.  Exercise, and detailed carbohydrates information provided  -  detailed on discharge  instructions.  5) Chronic Care/Health Maintenance: -he is not on ACEI/ARB and is on Statin medications and is encouraged to initiate and continue to follow up with Ophthalmology, Dentist, Podiatrist at least yearly or according to recommendations, and advised to stay away from smoking. I have recommended yearly flu vaccine and pneumonia vaccine at least every 5 years; moderate intensity exercise for up to 150 minutes weekly; and sleep for at least 7 hours a day.  - he is advised to maintain close follow up with Richardean Chimera, MD for primary care needs, as well as his other providers for optimal and coordinated care.     I spent  40  minutes in the care of the patient today including review of labs from CMP, Lipids, Thyroid Function, Hematology (current and previous including abstractions from other facilities); face-to-face time discussing  his blood glucose readings/logs, discussing hypoglycemia and hyperglycemia episodes and symptoms, medications doses, his options of short and long term treatment based on the latest standards of care / guidelines;  discussion about incorporating lifestyle medicine;  and documenting the encounter. Risk reduction counseling performed per USPSTF guidelines to reduce obesity and cardiovascular risk factors.     Please refer to Patient Instructions for Blood Glucose Monitoring and Insulin/Medications Dosing Guide"  in media tab for additional information. Please  also refer to " Patient Self Inventory" in the Media  tab for reviewed elements of pertinent patient history.  [name] [name] participated in the discussions, expressed understanding, and voiced agreement with the above plans.  All questions were answered to his satisfaction. he is encouraged to contact clinic should he have any questions or concerns prior to his return visit.     Follow up plan: - Return in about 3 months (around 04/06/2023) for Diabetes F/U with A1c in office, No previsit labs, Bring  meter and logs.    Ronny Bacon, [facility] [address] [place], [place] [address] Phone: [phone] Fax: [phone]  01/05/2023, 12:18 PM

## 2023-01-05 NOTE — Patient Instructions (Signed)

## 2023-01-07 ENCOUNTER — Encounter: Payer: Self-pay | Admitting: *Deleted

## 2023-01-15 ENCOUNTER — Other Ambulatory Visit: Payer: Self-pay

## 2023-01-19 ENCOUNTER — Other Ambulatory Visit: Payer: Self-pay | Admitting: Nurse Practitioner

## 2023-01-19 DIAGNOSIS — H903 Sensorineural hearing loss, bilateral: Secondary | ICD-10-CM | POA: Insufficient documentation

## 2023-01-19 DIAGNOSIS — H9313 Tinnitus, bilateral: Secondary | ICD-10-CM | POA: Diagnosis not present

## 2023-01-19 DIAGNOSIS — E1151 Type 2 diabetes mellitus with diabetic peripheral angiopathy without gangrene: Secondary | ICD-10-CM

## 2023-01-30 IMAGING — DX DG CHEST 2V
2 series · 2 of 2 positions shown · non-contrast
Comparison: 10/26/2015

CLINICAL DATA: Intermittent left-sided chest pain

EXAM:
CHEST - 2 VIEW

[chest pa]
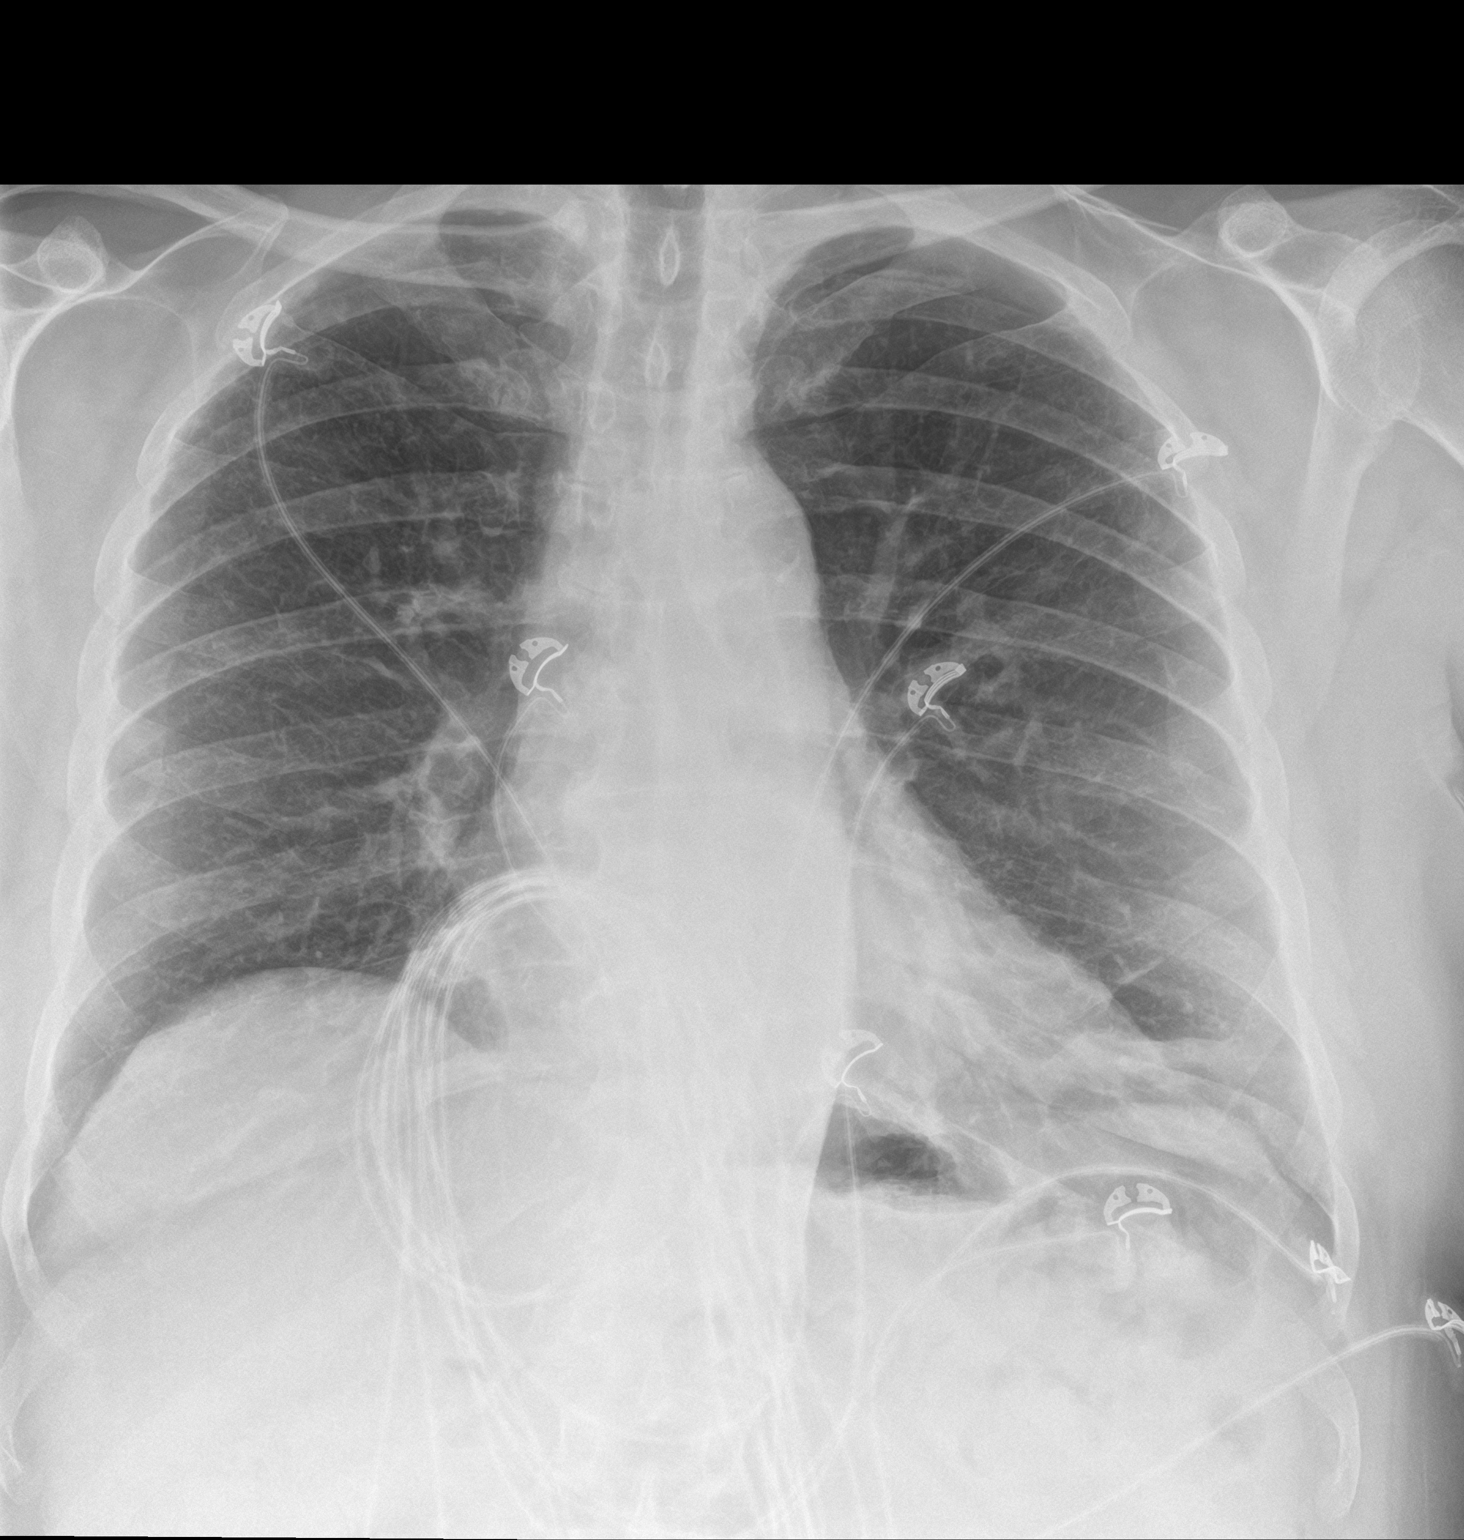

[chest lat]
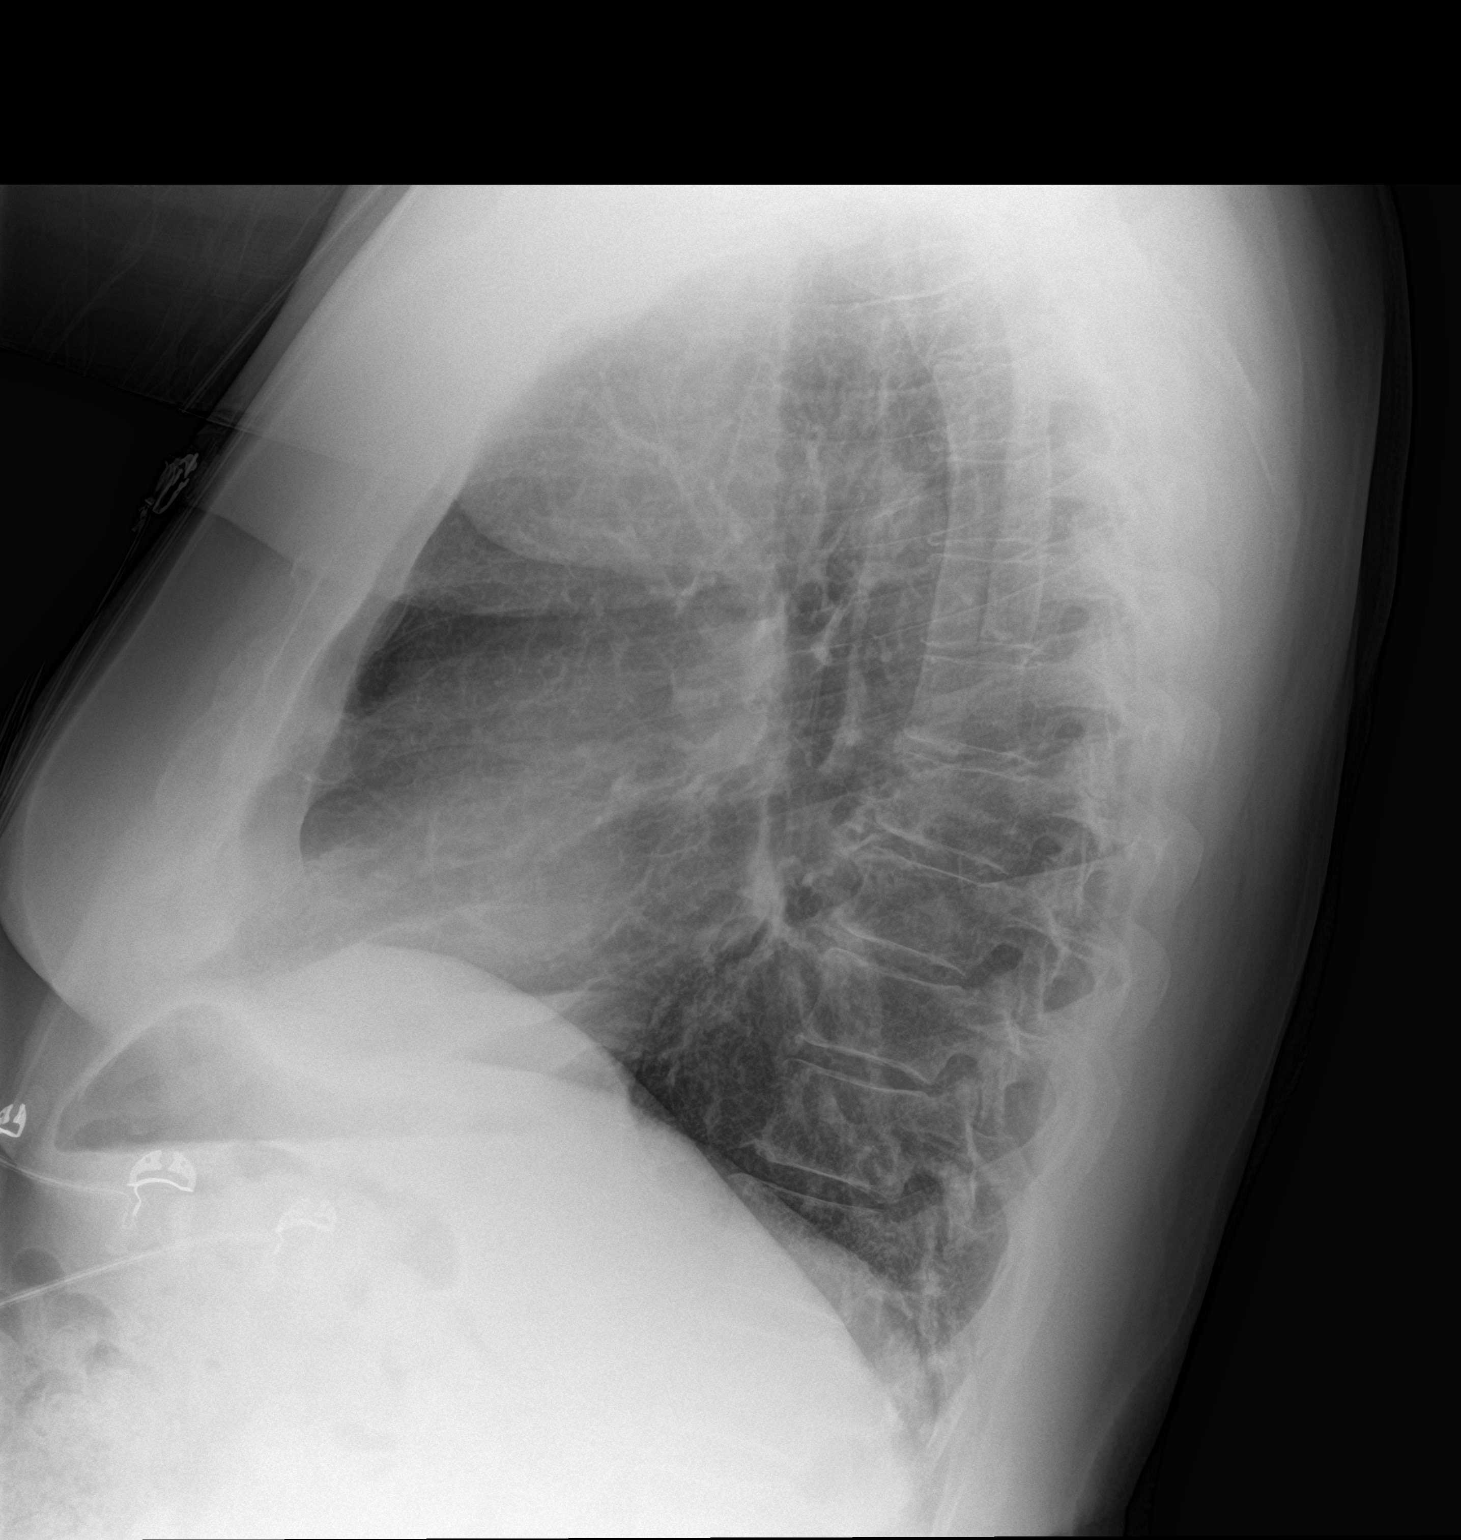

[2 of 2 positions shown; findings below may reference images not displayed]

FINDINGS: Normal heart size and mediastinal contours. Extensive artifact from
EKG leads. Streaky density over the left diaphragm. No edema,
effusion, or pneumothorax.
IMPRESSION: Mild left-sided atelectasis.

## 2023-02-18 ENCOUNTER — Ambulatory Visit: Payer: Medicare Other | Admitting: Gastroenterology

## 2023-03-03 ENCOUNTER — Ambulatory Visit: Payer: Medicare Other | Admitting: Cardiology

## 2023-03-12 ENCOUNTER — Telehealth: Payer: Self-pay | Admitting: *Deleted

## 2023-03-12 NOTE — Telephone Encounter (Signed)
Call to clarify if pt is still on Plavix but was unable to leave a message. Per MD Card note on 12/29/22 pt was to stop Plavix.

## 2023-03-18 ENCOUNTER — Encounter: Payer: Self-pay | Admitting: Internal Medicine

## 2023-03-18 ENCOUNTER — Ambulatory Visit: Payer: Medicare Other | Admitting: *Deleted

## 2023-03-18 VITALS — Ht 72.0 in | Wt 235.0 lb

## 2023-03-18 DIAGNOSIS — Z86010 Personal history of colonic polyps: Secondary | ICD-10-CM

## 2023-03-18 NOTE — Progress Notes (Signed)
Pt states he has never had to use Nitroglycerin tablets  Pt's previsit is done over the phone and all paperwork (prep instructions) sent to patient.  Pt's name and DOB verified at the beginning of the previsit.  Pt denies any difficulty with ambulating.   No egg or soy allergy known to patient  No issues known to pt with past sedation with any surgeries or procedures Patient denies ever being told they had issues or difficulty with intubation  No FH of Malignant Hyperthermia Pt is not on diet pills Pt is not on  home 02  Pt is not on blood thinners  Pt denies issues with constipation  Pt is not on dialysis Pt denies any upcoming cardiac testing Pt encouraged to use to use Singlecare or Goodrx to reduce cost

## 2023-03-24 ENCOUNTER — Encounter (HOSPITAL_COMMUNITY): Payer: Self-pay

## 2023-03-24 ENCOUNTER — Emergency Department (HOSPITAL_COMMUNITY)
Admission: EM | Admit: 2023-03-24 | Discharge: 2023-03-25 | Disposition: A | Discharge status: Home / Self Care | Payer: Medicare Other | Attending: Emergency Medicine | Admitting: Emergency Medicine

## 2023-03-24 ENCOUNTER — Other Ambulatory Visit: Payer: Self-pay

## 2023-03-24 ENCOUNTER — Emergency Department (HOSPITAL_COMMUNITY): Payer: Medicare Other

## 2023-03-24 DIAGNOSIS — I10 Essential (primary) hypertension: Secondary | ICD-10-CM | POA: Insufficient documentation

## 2023-03-24 DIAGNOSIS — R519 Headache, unspecified: Secondary | ICD-10-CM | POA: Diagnosis not present

## 2023-03-24 DIAGNOSIS — I7143 Infrarenal abdominal aortic aneurysm, without rupture: Secondary | ICD-10-CM | POA: Diagnosis not present

## 2023-03-24 DIAGNOSIS — K760 Fatty (change of) liver, not elsewhere classified: Secondary | ICD-10-CM | POA: Diagnosis not present

## 2023-03-24 DIAGNOSIS — D696 Thrombocytopenia, unspecified: Secondary | ICD-10-CM | POA: Insufficient documentation

## 2023-03-24 DIAGNOSIS — R7989 Other specified abnormal findings of blood chemistry: Secondary | ICD-10-CM | POA: Diagnosis not present

## 2023-03-24 DIAGNOSIS — D72819 Decreased white blood cell count, unspecified: Secondary | ICD-10-CM | POA: Insufficient documentation

## 2023-03-24 DIAGNOSIS — M791 Myalgia, unspecified site: Secondary | ICD-10-CM | POA: Diagnosis not present

## 2023-03-24 DIAGNOSIS — W57XXXA Bitten or stung by nonvenomous insect and other nonvenomous arthropods, initial encounter: Secondary | ICD-10-CM | POA: Insufficient documentation

## 2023-03-24 DIAGNOSIS — Z8546 Personal history of malignant neoplasm of prostate: Secondary | ICD-10-CM | POA: Diagnosis not present

## 2023-03-24 DIAGNOSIS — R5383 Other fatigue: Secondary | ICD-10-CM | POA: Diagnosis not present

## 2023-03-24 DIAGNOSIS — R0609 Other forms of dyspnea: Secondary | ICD-10-CM | POA: Diagnosis not present

## 2023-03-24 DIAGNOSIS — Z79899 Other long term (current) drug therapy: Secondary | ICD-10-CM | POA: Diagnosis not present

## 2023-03-24 DIAGNOSIS — R0600 Dyspnea, unspecified: Secondary | ICD-10-CM | POA: Diagnosis not present

## 2023-03-24 DIAGNOSIS — N2889 Other specified disorders of kidney and ureter: Secondary | ICD-10-CM | POA: Diagnosis not present

## 2023-03-24 DIAGNOSIS — E119 Type 2 diabetes mellitus without complications: Secondary | ICD-10-CM | POA: Diagnosis not present

## 2023-03-24 DIAGNOSIS — R6883 Chills (without fever): Secondary | ICD-10-CM | POA: Diagnosis not present

## 2023-03-24 DIAGNOSIS — I2510 Atherosclerotic heart disease of native coronary artery without angina pectoris: Secondary | ICD-10-CM | POA: Diagnosis not present

## 2023-03-24 LAB — URINALYSIS, ROUTINE W REFLEX MICROSCOPIC
Bilirubin Urine: NEGATIVE
Glucose, UA: >=500 mg/dL — AB
Hgb urine dipstick: NEGATIVE
Ketones, ur: 5 mg/dL — AB
Leukocytes,Ua: NEGATIVE
Nitrite: NEGATIVE
Protein, ur: 30 mg/dL — AB
Specific Gravity, Urine: 1.035 — ABNORMAL HIGH (ref 1.005–1.030)
pH: 5 (ref 5.0–8.0)

## 2023-03-24 LAB — BASIC METABOLIC PANEL
Anion gap: 10 (ref 5–15)
BUN: 18 mg/dL (ref 8–23)
CO2: 24 mmol/L (ref 22–32)
Calcium: 8.9 mg/dL (ref 8.9–10.3)
Chloride: 96 mmol/L — ABNORMAL LOW (ref 98–111)
Creatinine, Ser: 1.13 mg/dL (ref 0.61–1.24)
GFR, Estimated: >60 mL/min (ref >60)
Glucose, Bld: 275 mg/dL — ABNORMAL HIGH (ref 70–99)
Potassium: 4.2 mmol/L (ref 3.5–5.1)
Sodium: 130 mmol/L — ABNORMAL LOW (ref 135–145)

## 2023-03-24 LAB — CBC
HCT: 43.1 % (ref 39.0–52.0)
Hemoglobin: 15.3 g/dL (ref 13.0–17.0)
MCH: 29.7 pg (ref 26.0–34.0)
MCHC: 35.5 g/dL (ref 30.0–36.0)
MCV: 83.7 fL (ref 80.0–100.0)
Platelets: 79 10*3/uL — ABNORMAL LOW (ref 150–400)
RBC: 5.15 MIL/uL (ref 4.22–5.81)
RDW: 13 % (ref 11.5–15.5)
WBC: 2.8 10*3/uL — ABNORMAL LOW (ref 4.0–10.5)
nRBC: 0 % (ref 0.0–0.2)

## 2023-03-24 LAB — HEPATIC FUNCTION PANEL
ALT: 121 U/L — ABNORMAL HIGH (ref 0–44)
AST: 135 U/L — ABNORMAL HIGH (ref 15–41)
Albumin: 3.9 g/dL (ref 3.5–5.0)
Alkaline Phosphatase: 76 U/L (ref 38–126)
Bilirubin, Direct: 0.7 mg/dL — ABNORMAL HIGH (ref 0.0–0.2)
Indirect Bilirubin: 2.2 mg/dL — ABNORMAL HIGH (ref 0.3–0.9)
Total Bilirubin: 2.9 mg/dL — ABNORMAL HIGH (ref 0.3–1.2)
Total Protein: 7.2 g/dL (ref 6.5–8.1)

## 2023-03-24 LAB — CK: Total CK: 55 U/L (ref 49–397)

## 2023-03-24 MED ORDER — DOXYCYCLINE HYCLATE 100 MG PO CAPS: 100.0000 mg | ORAL_CAPSULE | Freq: Two times a day (BID) | ORAL | 0 refills | Status: DC

## 2023-03-24 MED ORDER — IOHEXOL 300 MG/ML  SOLN
100.0000 mL | Freq: Once | INTRAMUSCULAR | Status: AC | PRN
  Administered 2023-03-24: 100 mL via INTRAVENOUS

## 2023-03-24 MED ORDER — SODIUM CHLORIDE 0.9 % IV BOLUS
1000.0000 mL | Freq: Once | INTRAVENOUS | Status: AC
  Administered 2023-03-24: 1000 mL via INTRAVENOUS

## 2023-03-24 NOTE — Discharge Instructions (Signed)
Continue to drink plenty of fluids.  Take the antibiotic as directed.  Your tick titers are still pending.  These can take several days.  You can follow the results on MyChart.  Please call your primary doctor to arrange a follow-up appointment for early next week as the results should be back by then and you will also need to have your blood work rechecked.  Return to the emergency department for any new or worsening symptoms

## 2023-03-24 NOTE — ED Triage Notes (Signed)
Pt was seen by PCP today for chills, weakness and fatigue after being out in the heat sunday. Took blood work and and called instructed to come to ED because liver enzymes were elevated and WBC low.

## 2023-03-24 NOTE — ED Notes (Addendum)
Patient transported to CT 

## 2023-03-24 NOTE — ED Notes (Signed)
Assisted pt to restroom & provided specimen cup.

## 2023-03-26 ENCOUNTER — Encounter: Payer: Medicare Other | Admitting: Internal Medicine

## 2023-03-26 LAB — LYME DISEASE SEROLOGY W/REFLEX: Lyme Total Antibody EIA: NEGATIVE

## 2023-03-26 NOTE — ED Provider Notes (Signed)
[place] EMERGENCY DEPARTMENT AT [facility] Provider Note   CSN: [id number] Arrival date & time: 03/24/23  1803     History  Chief Complaint  Patient presents with   Abnormal Labs    [name] [name] is a 67 y.o. male.  HPI     [name] [name] is a 67 y.o. male with past medical history of of CAD, type 2 diabetes, hypertension, testicular cancer who presents to the Emergency Department at the advisement of her PCP.  Patient complains of generalized bodyaches, chills, weakness and fatigue.  He was working out in the sun several days ago.  Concerned about heat exposure.  He had blood work at his PCPs office and was later contacted to come to the emergency department because his liver enzymes were elevated and he had a low white blood cell count.  Patient denies any history hepatitis.  Denies chest pain or shortness of breath fever or chills. He does endorse for tick bites in the last few weeks.  No rash    Home Medications Prior to Admission medications   Medication Sig Start Date End Date Taking? Authorizing Provider  doxycycline (VIBRAMYCIN) 100 MG capsule Take 1 capsule (100 mg total) by mouth 2 (two) times daily. 03/24/23  Yes Naima Veldhuizen, PA-C  albuterol (VENTOLIN HFA) 108 (90 Base) MCG/ACT inhaler SMARTSIG:2 Puff(s) Via Inhaler 4 Times Daily PRN 09/30/22   [provider]  aspirin EC 81 MG tablet Take 1 tablet (81 mg total) by mouth daily. 05/03/19   Jonelle Sidle, MD  Blood Glucose Monitoring Suppl (ACCU-CHEK GUIDE ME) w/Device KIT Use to monitor glucose twice daily. 10/13/22   Dani Gobble, NP  Blood Glucose Monitoring Suppl (ONETOUCH VERIO) w/Device KIT Use to test BG qid. E11.65 10/13/22   Dani Gobble, NP  fish oil-omega-3 fatty acids 1000 MG capsule Take 1 g by mouth 2 (two) times daily.    [provider]  glucose blood (ONETOUCH VERIO) test strip 1 each by Other route 2 (two) times daily. 10/14/22   Dani Gobble, NP   Lancets MISC 1 each by Does not apply route in the morning and at bedtime. 10/14/22   Dani Gobble, NP  metFORMIN (GLUCOPHAGE-XR) 500 MG 24 hr tablet TAKE 1 TABLET(500 MG) BY MOUTH TWICE DAILY WITH A MEAL 01/19/23   Reardon, Freddi Starr, NP  nitroGLYCERIN (NITROSTAT) 0.4 MG SL tablet DISSOLVE 1 TABLET UNDER THE TONGUE EVERY 5 MINUTES FOR 3 DOSES AS NEEDED. MAY REPEAT EVERY 5 MINUTES FOR UP TO 3 DOSES. Patient not taking: Reported on 03/18/2023 10/13/22   Jonelle Sidle, MD  NON FORMULARY Omega XL twice daily    [provider]  pantoprazole (PROTONIX) 40 MG tablet Take 1 tablet (40 mg total) by mouth daily. 04/16/22 12/29/22  Arty Baumgartner, NP  Semaglutide, 2 MG/DOSE, 8 MG/3ML SOPN Inject 2 mg as directed once a week. 01/05/23   Dani Gobble, NP  simvastatin (ZOCOR) 40 MG tablet Take 1 tablet (40 mg total) by mouth every evening. 10/13/22   Jonelle Sidle, MD      Allergies    Patient has no known allergies.    Review of Systems   Review of Systems  Constitutional:  Positive for appetite change, chills and fatigue. Negative for fever.  Eyes:  Negative for visual disturbance.  Respiratory:  Negative for shortness of breath.   Cardiovascular:  Negative for chest pain.  Gastrointestinal:  Negative for abdominal pain,  nausea and vomiting.  Musculoskeletal:  Positive for arthralgias and myalgias.  Skin:  Negative for color change and rash.  Neurological:  Positive for weakness. Negative for dizziness, numbness and headaches.    Physical Exam Updated Vital Signs BP (!) 141/81 (BP Location: Right Arm)   Pulse 91   Temp 100 F (37.8 C) (Oral)   Resp 16   Ht 6' (1.829 m)   Wt 107 kg   SpO2 96%   BMI 31.99 kg/m  Physical Exam Vitals and nursing note reviewed.  Constitutional:      General: He is not in acute distress.    Appearance: Normal appearance. He is not ill-appearing or toxic-appearing.  HENT:     Mouth/Throat:     Mouth: Mucous membranes are moist.      Pharynx: Oropharynx is clear.  Eyes:     General: No scleral icterus.    Extraocular Movements: Extraocular movements intact.  Cardiovascular:     Rate and Rhythm: Normal rate and regular rhythm.     Pulses: Normal pulses.  Pulmonary:     Effort: Pulmonary effort is normal.  Abdominal:     Palpations: Abdomen is soft. There is no hepatomegaly.     Tenderness: There is no abdominal tenderness.  Musculoskeletal:        General: Normal range of motion.     Right lower leg: No edema.     Left lower leg: No edema.  Skin:    General: Skin is warm.     Capillary Refill: Capillary refill takes less than 2 seconds.     Findings: No erythema or rash.  Neurological:     General: No focal deficit present.     Mental Status: He is alert.     Sensory: No sensory deficit.     Motor: No weakness.     ED Results / Procedures / Treatments   Labs (all labs ordered are listed, but only abnormal results are displayed) Labs Reviewed  BASIC METABOLIC PANEL - Abnormal; Notable for the following components:      Result Value   Sodium 130 (*)    Chloride 96 (*)    Glucose, Bld 275 (*)    All other components within normal limits  CBC - Abnormal; Notable for the following components:   WBC 2.8 (*)    Platelets 79 (*)    All other components within normal limits  URINALYSIS, ROUTINE W REFLEX MICROSCOPIC - Abnormal; Notable for the following components:   Color, Urine AMBER (*)    Specific Gravity, Urine 1.035 (*)    Glucose, UA >=500 (*)    Ketones, ur 5 (*)    Protein, ur 30 (*)    Bacteria, UA RARE (*)    All other components within normal limits  HEPATIC FUNCTION PANEL - Abnormal; Notable for the following components:   AST 135 (*)    ALT 121 (*)    Total Bilirubin 2.9 (*)    Bilirubin, Direct 0.7 (*)    Indirect Bilirubin 2.2 (*)    All other components within normal limits  CK  LYME DISEASE SEROLOGY W/REFLEX  SPOTTED FEVER GROUP ANTIBODIES    EKG None  Radiology CT  ABDOMEN PELVIS W CONTRAST  Result Date: 03/24/2023 CLINICAL DATA:  Abdominal pain, acute, nonlocalized EXAM: CT ABDOMEN AND PELVIS WITH CONTRAST TECHNIQUE: Multidetector CT imaging of the abdomen and pelvis was performed using the standard protocol following bolus administration of intravenous contrast. RADIATION DOSE REDUCTION: This exam was  performed according to the departmental dose-optimization program which includes automated exposure control, adjustment of the mA and/or kV according to patient size and/or use of iterative reconstruction technique. CONTRAST:  OMNIPAQUE IOHEXOL 300 MG/ML  SOLN COMPARISON:  None Available. FINDINGS: Lower chest: No acute abnormality. Probable mid right coronary artery stenting. Hepatobiliary: Mild hepatic steatosis. No enhancing intrahepatic mass. No intra or extrahepatic biliary ductal dilation. Gallbladder unremarkable. Pancreas: Unremarkable Spleen: Unremarkable Adrenals/Urinary Tract: The adrenal glands are unremarkable. There is marked atrophy of the left kidney with multiple simple cortical cysts identified for which no follow-up imaging is recommended. This was described on prior examination of 10/10/2021 though those images are unavailable for direct evaluation. Compensatory hypertrophy of the right kidney again noted. The right kidney is otherwise unremarkable. No hydronephrosis. No intrarenal or ureteral calculi. The bladder is unremarkable. Stomach/Bowel: Stomach is within normal limits. Appendix appears normal. No evidence of bowel wall thickening, distention, or inflammatory changes. Vascular/Lymphatic: 3 cm infrarenal abdominal aortic aneurysm is present. Superimposed extensive aortoiliac atherosclerotic mixed plaque. No pathologic adenopathy within the abdomen and pelvis. Reproductive: The prostate gland is unremarkable. Seminal vesicles are unremarkable. Probable changes of right orchiectomy identified. Other: No abdominal wall hernia or abnormality. No  abdominopelvic ascites. Musculoskeletal: Degenerative changes are seen within the lumbar spine. No acute bone abnormality. No lytic or blastic bone lesion. IMPRESSION: 1. No acute intra-abdominal pathology identified. No definite radiographic explanation for the patient's reported symptoms. 2. Mild hepatic steatosis. 3. Marked atrophy of the left kidney with compensatory hypertrophy of the right kidney. 4. 3 cm infrarenal abdominal aortic aneurysm. Recommend follow-up ultrasound every 3 years. 5. Aortic atherosclerosis. Aortic Atherosclerosis (ICD10-I70.0). Electronically Signed   By: Helyn Numbers M.D.   On: 03/24/2023 22:59    Procedures Procedures    Medications Ordered in ED Medications  sodium chloride 0.9 % bolus 1,000 mL (0 mLs Intravenous Stopped 03/24/23 2332)  iohexol (OMNIPAQUE) 300 MG/ML solution 100 mL (100 mLs Intravenous Contrast Given 03/24/23 2226)    ED Course/ Medical Decision Making/ A&P                             Medical Decision Making Patient here under advisement of PCP for evaluation of elevated liver enzymes and leukopenia.  Symptoms began several days ago patient reports working out in the heat few days ago.  States he has been drinking significant amounts of water since that time.  He also endorses several tick bites over the last few weeks.  No rash abdominal pain, nausea or vomiting.  No prior history of liver disease.  Differential would include but not limited to heat exposure, infectious versus viral process, tick related illness  Amount and/or Complexity of Data Reviewed Labs: ordered.    Details: Labs interpreted by me, leukopenia with white count of 2.8.  Urinalysis shows close without evidence of infection, total CK reassuring LFTs mildly elevated chemistries show hyponatremia mild hyperglycemia with reassuring anion gap.  Mild thrombocytopenia Radiology: ordered.    Details: CT abdomen and pelvis ordered for further evaluation of possible liver disease.   No acute intra-abdominal pathology seen. Discussion of management or test interpretation with external provider(s): Patient with leukopenia and thrombocytopenia with elevated LFTs.  Source of patient's symptoms unclear at this time.  Overall he is well-appearing nontoxic.  Reassuring abdominal exam.  CT of the abdomen without acute findings.  He does endorse multiple tick bites.  I suspect symptoms may be related to  tick illness.  Titers for Lyme disease and RMSF pending.  Will start patient on doxycycline.  He will follow-up closely outpatient with PCP.  Return precautions were also discussed.  Risk Prescription drug management.           Final Clinical Impression(s) / ED Diagnoses Final diagnoses:  Myalgia  Leukopenia, unspecified type  Thrombocytopenia (HCC)  Tick bite, unspecified site, initial encounter    Rx / DC Orders ED Discharge Orders          Ordered    doxycycline (VIBRAMYCIN) 100 MG capsule  2 times daily        03/24/23 2351              Pauline Aus, PA-C 03/26/23 1706    Terrilee Files, MD 03/27/23 1018

## 2023-03-30 ENCOUNTER — Encounter: Payer: Medicare Other | Admitting: Internal Medicine

## 2023-04-02 LAB — SPOTTED FEVER GROUP ANTIBODIES
Spotted Fever Group IgG: <1:64 {titer}
Spotted Fever Group IgM: <1:64 {titer}

## 2023-04-05 ENCOUNTER — Telehealth: Payer: Self-pay | Admitting: Internal Medicine

## 2023-04-05 NOTE — Telephone Encounter (Signed)
RN contacted patient regarding his inquiry about eating non-allowed foods. Patient states he only ate corn. RN advised patient to increase fluid intake today and tomorrow prior to his scheduled colonoscopy on 7/8. Patient stated understanding.

## 2023-04-05 NOTE — Telephone Encounter (Signed)
Inbound call from patient states he had a few foods that he was not suppose to eat over the weekend. Patient had baked beans black eye peas and corn. Please advise.  Thank you

## 2023-04-07 ENCOUNTER — Encounter: Payer: Self-pay | Admitting: Internal Medicine

## 2023-04-07 ENCOUNTER — Ambulatory Visit (AMBULATORY_SURGERY_CENTER): Payer: Medicare Other | Admitting: Internal Medicine

## 2023-04-07 VITALS — BP 131/66 | HR 82 | Temp 97.3°F | Resp 14 | Ht 72.0 in | Wt 235.0 lb

## 2023-04-07 DIAGNOSIS — D124 Benign neoplasm of descending colon: Secondary | ICD-10-CM

## 2023-04-07 DIAGNOSIS — Z09 Encounter for follow-up examination after completed treatment for conditions other than malignant neoplasm: Secondary | ICD-10-CM

## 2023-04-07 DIAGNOSIS — D122 Benign neoplasm of ascending colon: Secondary | ICD-10-CM | POA: Diagnosis not present

## 2023-04-07 DIAGNOSIS — Z86010 Personal history of colonic polyps: Secondary | ICD-10-CM | POA: Diagnosis not present

## 2023-04-07 MED ORDER — SODIUM CHLORIDE 0.9 % IV SOLN
500.0000 mL | INTRAVENOUS | Status: DC
Start: 2023-04-07 — End: 2023-04-07

## 2023-04-07 NOTE — Progress Notes (Signed)
[facility] History and Physical   Primary Care Physician:  Richardean Chimera, MD   Reason for Procedure:    Encounter Diagnosis  Name Primary?   Personal history of colonic polyps Yes     Plan:    colonoscopy     HPI: [name] [name] is a 67 y.o. male s/p removal of 7 diminutive colon adenomas at colonoscopy 03/2019   Past Medical History:  Diagnosis Date   Arthritis    Cholelithiasis    Coronary atherosclerosis of native coronary artery    a. s/p DES to RCA in 2005 b. 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   Type 2 diabetes mellitus (HCC)    Urolithiasis     Past Surgical History:  Procedure Laterality Date   Colonoscopy     COLONOSCOPY     CORONARY STENT INTERVENTION N/A 04/16/2022   Procedure: CORONARY STENT INTERVENTION;  Surgeon: Swaziland, Peter M, MD;  Location: [facility] INVASIVE CV LAB;  Service: Cardiovascular;  Laterality: N/A;   CORONARY STENT PLACEMENT  2005   LEFT HEART CATH AND CORONARY ANGIOGRAPHY N/A 04/16/2022   Procedure: LEFT HEART CATH AND CORONARY ANGIOGRAPHY;  Surgeon: Swaziland, Peter M, MD;  Location: [facility] INVASIVE CV LAB;  Service: Cardiovascular;  Laterality: N/A;   Testicular tumor resection     Right side    Prior to Admission medications   Medication Sig Start Date End Date Taking? Authorizing Provider  aspirin EC 81 MG tablet Take 1 tablet (81 mg total) by mouth daily. 05/03/19  Yes Jonelle Sidle, MD  doxycycline (VIBRAMYCIN) 100 MG capsule Take 1 capsule (100 mg total) by mouth 2 (two) times daily. 03/24/23  Yes Triplett, Tammy, PA-C  fish oil-omega-3 fatty acids 1000 MG capsule Take 1 g by mouth 2 (two) times daily.   Yes [provider]  metFORMIN (GLUCOPHAGE-XR) 500 MG 24 hr tablet TAKE 1  TABLET(500 MG) BY MOUTH TWICE DAILY WITH A MEAL 01/19/23  Yes Reardon, Freddi Starr, NP  NON FORMULARY Omega XL twice daily   Yes [provider]  simvastatin (ZOCOR) 40 MG tablet Take 1 tablet (40 mg total) by mouth every evening. 10/13/22  Yes Jonelle Sidle, MD  albuterol (VENTOLIN HFA) 108 (90 Base) MCG/ACT inhaler SMARTSIG:2 Puff(s) Via Inhaler 4 Times Daily PRN 09/30/22   [provider]  Blood Glucose Monitoring Suppl (ACCU-CHEK GUIDE ME) w/Device KIT Use to monitor glucose twice daily. 10/13/22   Dani Gobble, NP  Blood Glucose Monitoring Suppl (ONETOUCH VERIO) w/Device KIT Use to test BG qid. E11.65 10/13/22   Dani Gobble, NP  glucose blood (ONETOUCH VERIO) test strip 1 each by Other route 2 (two) times daily. 10/14/22   Dani Gobble, NP  Lancets MISC 1 each by Does not apply route in the morning and at bedtime. 10/14/22   Dani Gobble, NP  nitroGLYCERIN (NITROSTAT) 0.4 MG SL tablet DISSOLVE 1 TABLET UNDER THE TONGUE EVERY 5 MINUTES FOR 3 DOSES AS NEEDED. MAY REPEAT EVERY 5 MINUTES FOR UP TO 3 DOSES. Patient not taking: Reported on 03/18/2023 10/13/22   Jonelle Sidle, MD  pantoprazole (PROTONIX) 40 MG tablet Take 1 tablet (40 mg total) by mouth daily. 04/16/22 12/29/22  Laverda Page  B, NP  Semaglutide, 2 MG/DOSE, 8 MG/3ML SOPN Inject 2 mg as directed once a week. 01/05/23   Dani Gobble, NP    Current Outpatient Medications  Medication Sig Dispense Refill   aspirin EC 81 MG tablet Take 1 tablet (81 mg total) by mouth daily. 90 tablet 3   doxycycline (VIBRAMYCIN) 100 MG capsule Take 1 capsule (100 mg total) by mouth 2 (two) times daily. 20 capsule 0   fish oil-omega-3 fatty acids 1000 MG capsule Take 1 g by mouth 2 (two) times daily.     metFORMIN (GLUCOPHAGE-XR) 500 MG 24 hr tablet TAKE 1 TABLET(500 MG) BY MOUTH TWICE DAILY WITH A MEAL 180 tablet 0   NON FORMULARY Omega XL twice daily     simvastatin (ZOCOR) 40 MG tablet Take 1 tablet (40 mg  total) by mouth every evening. 90 tablet 3   albuterol (VENTOLIN HFA) 108 (90 Base) MCG/ACT inhaler SMARTSIG:2 Puff(s) Via Inhaler 4 Times Daily PRN     Blood Glucose Monitoring Suppl (ACCU-CHEK GUIDE ME) w/Device KIT Use to monitor glucose twice daily. 1 kit 0   Blood Glucose Monitoring Suppl (ONETOUCH VERIO) w/Device KIT Use to test BG qid. E11.65 1 kit 0   glucose blood (ONETOUCH VERIO) test strip 1 each by Other route 2 (two) times daily. 100 each 3   Lancets MISC 1 each by Does not apply route in the morning and at bedtime. 100 each 3   nitroGLYCERIN (NITROSTAT) 0.4 MG SL tablet DISSOLVE 1 TABLET UNDER THE TONGUE EVERY 5 MINUTES FOR 3 DOSES AS NEEDED. MAY REPEAT EVERY 5 MINUTES FOR UP TO 3 DOSES. (Patient not taking: Reported on 03/18/2023) 25 tablet 3   pantoprazole (PROTONIX) 40 MG tablet Take 1 tablet (40 mg total) by mouth daily. 30 tablet 1   Semaglutide, 2 MG/DOSE, 8 MG/3ML SOPN Inject 2 mg as directed once a week. 3 mL 3   Current Facility-Administered Medications  Medication Dose Route Frequency Provider Last Rate Last Admin   0.9 %  sodium chloride infusion  500 mL Intravenous Continuous Iva Boop, MD        Allergies as of 04/07/2023   (No Known Allergies)    Family History  Problem Relation Age of Onset   Heart disease Father    Diabetes Father    Heart attack Father    Colon cancer Neg Hx    Esophageal cancer Neg Hx    Rectal cancer Neg Hx    Stomach cancer Neg Hx    Colon polyps Neg Hx     Social History   Socioeconomic History   Marital status: Married    Spouse name: [name]   Number of children: 0   Years of education: Not on file   Highest education level: Not on file  Occupational History   Occupation: Truck driver  Tobacco Use   Smoking status: Former    Packs/day: 1.50    Years: 43.00    Additional pack years: 0.00    Total pack years: 64.50    Types: Cigarettes    Quit date: 04/21/2010    Years since quitting: 12.9   Smokeless tobacco:  Never  Vaping Use   Vaping Use: Never used  Substance and Sexual Activity   Alcohol use: Yes    Comment: occasionally   Drug use: No   Sexual activity: Not on file  Other Topics Concern   Not on file  Social History Narrative   Not on file  Social Determinants of Health   Financial Resource Strain: Not on file  Food Insecurity: Not on file  Transportation Needs: Not on file  Physical Activity: Not on file  Stress: Not on file  Social Connections: Not on file  Intimate Partner Violence: Not on file    Review of Systems:  All other review of systems negative except as mentioned in the HPI.  Physical Exam: Vital signs BP 120/62   Pulse 87   Temp (!) 97.3 F (36.3 C)   Ht 6' (1.829 m)   Wt 235 lb (106.6 kg)   SpO2 97%   BMI 31.87 kg/m   General:   Alert,  Well-developed, well-nourished, pleasant and cooperative in NAD Lungs:  Clear throughout to auscultation.   Heart:  Regular rate and rhythm; no murmurs, clicks, rubs,  or gallops. Abdomen:  Soft, nontender and nondistended. Normal bowel sounds.   Neuro/Psych:  Alert and cooperative. Normal mood and affect. A and O x 3   @Merly Hinkson  Sena Slate, MD, [facility] [phone] (pager) 04/07/2023 9:29 AM@

## 2023-04-07 NOTE — Progress Notes (Signed)
Called to room to assist during endoscopic procedure.  Patient ID and intended procedure confirmed with present staff. Received instructions for my participation in the procedure from the performing physician.  

## 2023-04-07 NOTE — Progress Notes (Signed)
To pacu, VSS. Report to [email or web address] 

## 2023-04-07 NOTE — Op Note (Signed)
[facility] Patient Name: [name] [name] Procedure Date: 04/07/2023 9:29 AM MRN: [id number] Endoscopist: Iva Boop , MD, [id number] Age: 67 Referring MD:  Date of Birth: 12-13-1955 Gender: Male Account #: [id number] Procedure:                Colonoscopy Indications:              Surveillance: Personal history of adenomatous                            polyps on last colonoscopy > 3 years ago, Last                            colonoscopy: 2020 Medicines:                Monitored Anesthesia Care Procedure:                Pre-Anesthesia Assessment:                           - Prior to the procedure, a History and Physical                            was performed, and patient medications and                            allergies were reviewed. The patient's tolerance of                            previous anesthesia was also reviewed. The risks                            and benefits of the procedure and the sedation                            options and risks were discussed with the patient.                            All questions were answered, and informed consent                            was obtained. Prior Anticoagulants: The patient has                            taken no anticoagulant or antiplatelet agents. ASA                            Grade Assessment: III - A patient with severe                            systemic disease. After reviewing the risks and                            benefits, the patient was deemed in satisfactory  condition to undergo the procedure.                           After obtaining informed consent, the colonoscope                            was passed under direct vision. Throughout the                            procedure, the patient's blood pressure, pulse, and                            oxygen saturations were monitored continuously. The                            CF [id number] #[id number] was introduced through the  anus                            and advanced to the the cecum, identified by                            appendiceal orifice and ileocecal valve. The                            colonoscopy was performed without difficulty. The                            patient tolerated the procedure well. The quality                            of the bowel preparation was good. The ileocecal                            valve, appendiceal orifice, and rectum were                            photographed. The bowel preparation used was                            Miralax via split dose instruction. Scope In: 9:39:25 AM Scope Out: 9:57:06 AM Scope Withdrawal Time: 0 hours 14 minutes 42 seconds  Total Procedure Duration: 0 hours 17 minutes 41 seconds  Findings:                 The perianal and digital rectal examinations were                            normal.                           Two sessile polyps were found in the descending                            colon and ascending colon. The polyps were  diminutive in size. These polyps were removed with                            a cold snare. Resection and retrieval were                            complete. Verification of patient identification                            for the specimen was done. Estimated blood loss was                            minimal.                           Multiple diverticula were found in the sigmoid                            colon.                           There was a medium-sized lipoma, in the sigmoid                            colon and in the ascending colon.                           The exam was otherwise without abnormality on                            direct and retroflexion views. Complications:            No immediate complications. Estimated Blood Loss:     Estimated blood loss: none. Impression:               - Two diminutive polyps in the descending colon and                             in the ascending colon, removed with a cold snare.                            Resected and retrieved.                           - Diverticulosis in the sigmoid colon.                           - Medium-sized lipoma in the sigmoid colon and in                            the ascending colon.                           - The examination was otherwise normal on direct  and retroflexion views.                           - Personal history of colonic polyps. 7 diminutive                            adenomas 2020 Recommendation:           - Patient has a contact number available for                            emergencies. The signs and symptoms of potential                            delayed complications were discussed with the                            patient. Return to normal activities tomorrow.                            Written discharge instructions were provided to the                            patient.                           - Resume previous diet.                           - Continue present medications.                           - Repeat colonoscopy is recommended for                            surveillance. The colonoscopy date will be                            determined after pathology results from today's                            exam become available for review.                           - Reduce/eliminate artificial sweeteners - likely                            contributing to/causing chrionic loose stools - +                            semaglutitide may contribute also. Iva Boop, MD 04/07/2023 10:11:08 AM This report has been signed electronically.

## 2023-04-07 NOTE — Progress Notes (Signed)
Pt's states no medical or surgical changes since previsit or office visit. 

## 2023-04-07 NOTE — Patient Instructions (Addendum)
I found and removed 2 tiny polyps today. I will let you know pathology results and when to have another routine colonoscopy by mail and/or My Chart.  Also saw diverticulosis and 2 lipomas (fat-containing tumors that are not a problem)  Artificial sweeteners can cause loose stools - try to get off diet soda and eliminate or greatly reduce artificial sweeteners like sweet & Low. That can be hard but has many benefits. Ozempic could also be playing a role in loose stools.   Discharge instructions given. Handouts on polyps and diverticulosis. Resume previous medications. YOU HAD AN ENDOSCOPIC PROCEDURE TODAY AT THE  ENDOSCOPY CENTER:   Refer to the procedure report that was given to you for any specific questions about what was found during the examination.  If the procedure report does not answer your questions, please call your gastroenterologist to clarify.  If you requested that your care partner not be given the details of your procedure findings, then the procedure report has been included in a sealed envelope for you to review at your convenience later.  YOU SHOULD EXPECT: Some feelings of bloating in the abdomen. Passage of more gas than usual.  Walking can help get rid of the air that was put into your GI tract during the procedure and reduce the bloating. If you had a lower endoscopy (such as a colonoscopy or flexible sigmoidoscopy) you may notice spotting of blood in your stool or on the toilet paper. If you underwent a bowel prep for your procedure, you may not have a normal bowel movement for a few days.  Please Note:  You might notice some irritation and congestion in your nose or some drainage.  This is from the oxygen used during your procedure.  There is no need for concern and it should clear up in a day or so.  SYMPTOMS TO REPORT IMMEDIATELY:  Following lower endoscopy (colonoscopy or flexible sigmoidoscopy):  Excessive amounts of blood in the stool  Significant tenderness  or worsening of abdominal pains  Swelling of the abdomen that is new, acute  Fever of 100F or higher   For urgent or emergent issues, a gastroenterologist can be reached at any hour by calling [phone]. Do not use MyChart messaging for urgent concerns.    DIET:  We do recommend a small meal at first, but then you may proceed to your regular diet.  Drink plenty of fluids but you should avoid alcoholic beverages for 24 hours.  ACTIVITY:  You should plan to take it easy for the rest of today and you should NOT DRIVE or use heavy machinery until tomorrow (because of the sedation medicines used during the test).    FOLLOW UP: Our staff will call the number listed on your records the next business day following your procedure.  We will call around 7:15- 8:00 am to check on you and address any questions or concerns that you may have regarding the information given to you following your procedure. If we do not reach you, we will leave a message.     If any biopsies were taken you will be contacted by phone or by letter within the next 1-3 weeks.  Please call us at [phone] if you have not heard about the biopsies in 3 weeks.    SIGNATURES/CONFIDENTIALITY: You and/or your care partner have signed paperwork which will be entered into your electronic medical record.  These signatures attest to the fact that that the information above on your After Visit  Summary has been reviewed and is understood.  Full responsibility of the confidentiality of this discharge information lies with you and/or your care-partner.

## 2023-04-08 ENCOUNTER — Telehealth: Payer: Self-pay | Admitting: *Deleted

## 2023-04-08 NOTE — Telephone Encounter (Signed)
  Follow up Call-     04/07/2023    8:43 AM  Call back number  Post procedure Call Back phone  # [phone]  Permission to leave phone message Yes     Patient questions:  Do you have a fever, pain , or abdominal swelling? No. Pain Score  0 *  Have you tolerated food without any problems? Yes.    Have you been able to return to your normal activities? Yes.    Do you have any questions about your discharge instructions: Diet   No. Medications  No. Follow up visit  No.  Do you have questions or concerns about your Care? No.  Actions: * If pain score is 4 or above: No action needed, pain <4.

## 2023-04-16 ENCOUNTER — Encounter: Payer: Self-pay | Admitting: Internal Medicine

## 2023-04-20 ENCOUNTER — Encounter: Payer: Self-pay | Admitting: Nurse Practitioner

## 2023-04-20 ENCOUNTER — Ambulatory Visit: Payer: Medicare Other | Admitting: Nurse Practitioner

## 2023-04-20 VITALS — BP 130/74 | HR 77 | Ht 72.0 in | Wt 236.2 lb

## 2023-04-20 DIAGNOSIS — Z7984 Long term (current) use of oral hypoglycemic drugs: Secondary | ICD-10-CM

## 2023-04-20 DIAGNOSIS — E119 Type 2 diabetes mellitus without complications: Secondary | ICD-10-CM

## 2023-04-20 DIAGNOSIS — Z7985 Long-term (current) use of injectable non-insulin antidiabetic drugs: Secondary | ICD-10-CM

## 2023-04-20 LAB — POCT GLYCOSYLATED HEMOGLOBIN (HGB A1C): Hemoglobin A1C: 8 % — AB (ref 4.0–5.6)

## 2023-04-20 LAB — POCT UA - MICROALBUMIN
Albumin/Creatinine Ratio, Urine, POC: <30
Creatinine, POC: 300 mg/dL
Microalbumin Ur, POC: 30 mg/L

## 2023-04-20 MED ORDER — GLIPIZIDE ER 5 MG PO TB24: 5.0000 mg | ORAL_TABLET | Freq: Every day | ORAL | 3 refills | Status: DC

## 2023-04-20 NOTE — Progress Notes (Signed)
Endocrinology Follow Up Note       04/20/2023, 2:04 PM   Subjective:    Patient ID: [name] [name], male    DOB: 08/21/1956.  Kara Mead is being seen in follow up after being seen in consultation for management of currently uncontrolled symptomatic diabetes requested by  Richardean Chimera, MD.   Past Medical History:  Diagnosis Date   Arthritis    Cholelithiasis    Coronary atherosclerosis of native coronary artery    a. s/p DES to RCA in 2005 b. 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   Type 2 diabetes mellitus (HCC)    Urolithiasis     Past Surgical History:  Procedure Laterality Date   Colonoscopy     COLONOSCOPY     CORONARY STENT INTERVENTION N/A 04/16/2022   Procedure: CORONARY STENT INTERVENTION;  Surgeon: Swaziland, Peter M, MD;  Location: [facility] INVASIVE CV LAB;  Service: Cardiovascular;  Laterality: N/A;   CORONARY STENT PLACEMENT  2005   LEFT HEART CATH AND CORONARY ANGIOGRAPHY N/A 04/16/2022   Procedure: LEFT HEART CATH AND CORONARY ANGIOGRAPHY;  Surgeon: Swaziland, Peter M, MD;  Location: [facility] INVASIVE CV LAB;  Service: Cardiovascular;  Laterality: N/A;   Testicular tumor resection     Right side    Social History   Socioeconomic History   Marital status: Married    Spouse name: [name]   Number of children: 0   Years of education: Not on file   Highest education level: Not on file  Occupational History   Occupation: Truck driver  Tobacco Use   Smoking status: Former    Current packs/day: 0.00    Average packs/day: 1.5 packs/day for 43.0 years (64.5 ttl pk-yrs)    Types: Cigarettes    Start date: 04/22/1967    Quit date: 04/21/2010    Years since quitting:  13.0   Smokeless tobacco: Never  Vaping Use   Vaping status: Never Used  Substance and Sexual Activity   Alcohol use: Yes    Comment: occasionally   Drug use: No   Sexual activity: Not on file  Other Topics Concern   Not on file  Social History Narrative   Not on file   Social Determinants of Health   Financial Resource Strain: Not on file  Food Insecurity: Low Risk  (01/19/2023)   [facility]   Food vital sign    Within the past 12 months, you worried that your food would run out before you got money to buy more: Never true    Within the past 12 months, the food you bought just didn't last and you didn't have money to get more. : Never true  Transportation Needs: Not on file (01/19/2023)  Physical Activity: Not on file  Stress: Not on file  Social Connections: Not on file    Family History  Problem Relation Age of Onset   Heart  disease Father    Diabetes Father    Heart attack Father    Colon cancer Neg Hx    Esophageal cancer Neg Hx    Rectal cancer Neg Hx    Stomach cancer Neg Hx    Colon polyps Neg Hx     Outpatient Encounter Medications as of 04/20/2023  Medication Sig   albuterol (VENTOLIN HFA) 108 (90 Base) MCG/ACT inhaler SMARTSIG:2 Puff(s) Via Inhaler 4 Times Daily PRN   aspirin EC 81 MG tablet Take 1 tablet (81 mg total) by mouth daily.   Blood Glucose Monitoring Suppl (ACCU-CHEK GUIDE ME) w/Device KIT Use to monitor glucose twice daily.   Blood Glucose Monitoring Suppl (ONETOUCH VERIO) w/Device KIT Use to test BG qid. E11.65   fish oil-omega-3 fatty acids 1000 MG capsule Take 1 g by mouth 2 (two) times daily.   glipiZIDE (GLUCOTROL XL) 5 MG 24 hr tablet Take 1 tablet (5 mg total) by mouth daily with breakfast.   glucose blood (ONETOUCH VERIO) test strip 1 each by Other route 2 (two) times daily.   Lancets MISC 1 each by Does not apply route in the morning and at bedtime.   metFORMIN (GLUCOPHAGE-XR) 500 MG 24 hr tablet TAKE 1  TABLET(500 MG) BY MOUTH TWICE DAILY WITH A MEAL   nitroGLYCERIN (NITROSTAT) 0.4 MG SL tablet DISSOLVE 1 TABLET UNDER THE TONGUE EVERY 5 MINUTES FOR 3 DOSES AS NEEDED. MAY REPEAT EVERY 5 MINUTES FOR UP TO 3 DOSES.   NON FORMULARY Omega XL twice daily   pantoprazole (PROTONIX) 40 MG tablet Take 1 tablet (40 mg total) by mouth daily.   Semaglutide, 2 MG/DOSE, 8 MG/3ML SOPN Inject 2 mg as directed once a week.   simvastatin (ZOCOR) 40 MG tablet Take 1 tablet (40 mg total) by mouth every evening.   doxycycline (VIBRAMYCIN) 100 MG capsule Take 1 capsule (100 mg total) by mouth 2 (two) times daily. (Patient not taking: Reported on 04/20/2023)   No facility-administered encounter medications on file as of 04/20/2023.    ALLERGIES: No Known Allergies  VACCINATION STATUS: Immunization History  Administered Date(s) Administered   Pneumococcal Polysaccharide-23 07/15/2009   Tdap 03/05/2017   Zoster, Live 02/10/2016    Diabetes He presents for his follow-up diabetic visit. He has type 2 diabetes mellitus. Onset time: diagnosed at approx age of 89. His disease course has been improving. There are no hypoglycemic associated symptoms. There are no hypoglycemic complications. Diabetic complications include a CVA and heart disease (CAD). Risk factors for coronary artery disease include diabetes mellitus, dyslipidemia, family history, obesity, male sex, hypertension and sedentary lifestyle. Current diabetic treatment includes oral agent (monotherapy) and diet (Ozempic). He is compliant with treatment most of the time. His weight is fluctuating minimally. He is following a generally healthy diet. When asked about meal planning, he reported none. He has not had a previous visit with a dietitian. He rarely participates in exercise. His home blood glucose trend is decreasing steadily. His breakfast blood glucose range is generally 180-200 mg/dl. (He presents today, accompanied by his wife, with his logs showing  fasting hyperglycemia with some improvement overall.  His POCT A1c today is 8%, improving from last visit of 8.8%.  He continues to work on diet and lifestyle.  He was sick, seen in the ED for general malaise, thought to be from tick bite, treated empirically with Doxycycline and does feel better.) An ACE inhibitor/angiotensin II receptor blocker is not being taken. He sees a podiatrist.Eye exam is current.  Review of systems  Constitutional: + Minimally fluctuating body weight,  current Body mass index is 32.03 kg/m. , no fatigue, no subjective hyperthermia, no subjective hypothermia Eyes: no blurry vision, no xerophthalmia ENT: no sore throat, no nodules palpated in throat, no dysphagia/odynophagia, no hoarseness Cardiovascular: no chest pain, no shortness of breath, no palpitations, no leg swelling Respiratory: no cough, no shortness of breath Gastrointestinal: no nausea/vomiting/diarrhea Musculoskeletal: no muscle/joint aches Skin: no rashes, no hyperemia Neurological: no tremors, no numbness, no tingling, no dizziness Psychiatric: no depression, no anxiety  Objective:     BP 130/74 (BP Location: Left Arm, Patient Position: Sitting, Cuff Size: Large)   Pulse 77   Ht 6' (1.829 m)   Wt 236 lb 3.2 oz (107.1 kg)   BMI 32.03 kg/m   Wt Readings from Last 3 Encounters:  04/20/23 236 lb 3.2 oz (107.1 kg)  04/07/23 235 lb (106.6 kg)  03/24/23 235 lb 14.3 oz (107 kg)     BP Readings from Last 3 Encounters:  04/20/23 130/74  04/07/23 131/66  03/24/23 (!) 141/81      Physical Exam- Limited  Constitutional:  Body mass index is 32.03 kg/m. , not in acute distress, normal state of mind Eyes:  EOMI, no exophthalmos Musculoskeletal: no gross deformities, strength intact in all four extremities, no gross restriction of joint movements Skin:  no rashes, no hyperemia Neurological: no tremor with outstretched hands   Diabetic Foot Exam - Simple   No data filed     CMP ( most  recent) CMP     Component Value Date/Time   NA 130 (L) 03/24/2023 2007   K 4.2 03/24/2023 2007   CL 96 (L) 03/24/2023 2007   CO2 24 03/24/2023 2007   GLUCOSE 275 (H) 03/24/2023 2007   GLUCOSE 107 (H) 07/09/2006 1049   BUN 18 03/24/2023 2007   CREATININE 1.13 03/24/2023 2007   CALCIUM 8.9 03/24/2023 2007   PROT 7.2 03/24/2023 2007   ALBUMIN 3.9 03/24/2023 2007   AST 135 (H) 03/24/2023 2007   ALT 121 (H) 03/24/2023 2007   ALKPHOS 76 03/24/2023 2007   BILITOT 2.9 (H) 03/24/2023 2007   GFRNONAA >60 03/24/2023 2007   GFRAA >60 10/26/2015 1936     Diabetic Labs (most recent): Lab Results  Component Value Date   HGBA1C 8.0 (A) 04/20/2023   HGBA1C 8.8 (A) 01/05/2023   HGBA1C 6.8 (H) 08/12/2022   MICROALBUR 30 mg/L 04/20/2023   MICROALBUR <0.7 04/01/2018   MICROALBUR <0.7 03/05/2017     Lipid Panel ( most recent) Lipid Panel     Component Value Date/Time   CHOL 151 08/12/2022 1502   TRIG 211 (H) 08/12/2022 1502   TRIG 96 07/09/2006 1049   HDL 55 08/12/2022 1502   CHOLHDL 2.7 08/12/2022 1502   VLDL 42 (H) 08/12/2022 1502   LDLCALC 54 08/12/2022 1502   LDLDIRECT 57.0 04/01/2018 1408      Lab Results  Component Value Date   TSH 1.18 07/18/2021   TSH 1.27 01/26/2020   TSH 2.34 04/01/2018   TSH 2.14 03/05/2017   TSH 1.27 02/10/2016   TSH 1.35 11/02/2014   TSH 1.64 05/19/2013   TSH 1.21 12/16/2011   TSH 1.27 08/01/2010   TSH 1.39 06/13/2009   FREET4 0.70 07/18/2021           Assessment & Plan:   1) Type 2 Diabetes mellitus without complication without long-term current use of insulin  He presents today, accompanied by his wife, with  his logs showing fasting hyperglycemia with some improvement overall.  His POCT A1c today is 8%, improving from last visit of 8.8%.  He continues to work on diet and lifestyle.  He was sick, seen in the ED for general malaise, thought to be from tick bite, treated empirically with Doxycycline and does feel better.  Kara Mead has currently uncontrolled symptomatic type 2 DM since 67 years of age.   -Recent labs reviewed.  His LFTs were significantly elevated during recent check at the ED.  Will repeat CMP prior to next visit.  - I had a long discussion with him about the progressive nature of diabetes and the pathology behind its complications. -his diabetes is complicated by CAD with stents, CVA and he remains at a high risk for more acute and chronic complications which include CAD, CVA, CKD, retinopathy, and neuropathy. These are all discussed in detail with him.  The following Lifestyle Medicine recommendations according to American College of Lifestyle Medicine [facility]) were discussed and offered to patient and he agrees to start the journey:  A. Whole Foods, Plant-based plate comprising of fruits and vegetables, plant-based proteins, whole-grain carbohydrates was discussed in detail with the patient.   A list for source of those nutrients were also provided to the patient.  Patient will use only water or unsweetened tea for hydration. B.  The need to stay away from risky substances including alcohol, smoking; obtaining 7 to 9 hours of restorative sleep, at least 150 minutes of moderate intensity exercise weekly, the importance of healthy social connections,  and stress reduction techniques were discussed. C.  A full color page of  Calorie density of various food groups per pound showing examples of each food groups was provided to the patient.  - Nutritional counseling repeated at each appointment due to patients tendency to fall back in to old habits.  - The patient admits there is a room for improvement in their diet and drink choices. -  Suggestion is made for the patient to avoid simple carbohydrates from their diet including Cakes, Sweet Desserts / Pastries, Ice Cream, Soda (diet and regular), Sweet Tea, Candies, Chips, Cookies, Sweet Pastries, Store Bought Juices, Alcohol in Excess of 1-2 drinks a day,  Artificial Sweeteners, Coffee Creamer, and "Sugar-free" Products. This will help patient to have stable blood glucose profile and potentially avoid unintended weight gain.   - I encouraged the patient to switch to unprocessed or minimally processed complex starch and increased protein intake (animal or plant source), fruits, and vegetables.   - Patient is advised to stick to a routine mealtimes to eat 3 meals a day and avoid unnecessary snacks (to snack only to correct hypoglycemia).  - I have approached him with the following individualized plan to manage his diabetes and patient agrees:   -he is encouraged to start monitoring glucose once daily, before breakfast and to call the clinic if he has readings less than 70 or above 200 for 3 tests in a row.  - Adjustment parameters are given to him for hypo and hyperglycemia in writing.  - he is advised to continue Metformin 500 md XR twice daily with meals and continue Ozempic 2 mg SQ weekly.  I did initiate low dose Glipizide 5 mg XL daily with breakfast to help bring glucose down a bit quicker.  He is aware to be on the lookout for hypoglycemia.  - his Actos, Parlodel, and Jardiance will be discontinued, risk outweighs benefit for this patient.  He does not have significant CKD or CHF.  - Specific targets for  A1c; LDL, HDL, and Triglycerides were discussed with the patient.  2) Blood Pressure /Hypertension:  his blood pressure is controlled to target without the use of antihypertensive medications.     3) Lipids/Hyperlipidemia:    Review of his recent lipid panel from 08/12/22 showed controlled LDL at 54 and elevated triglycerides of 211.  he is advised to continue Simvastatin 40 mg daily at bedtime.  Side effects and precautions discussed with him.  4)  Weight/Diet:  his Body mass index is 32.03 kg/m.  -  clearly complicating his diabetes care.   he is a candidate for weight loss. I discussed with him the fact that loss of 5 - 10% of his   current body weight will have the most impact on his diabetes management.  Exercise, and detailed carbohydrates information provided  -  detailed on discharge instructions.  5) Chronic Care/Health Maintenance: -he is not on ACEI/ARB and is on Statin medications and is encouraged to initiate and continue to follow up with Ophthalmology, Dentist, Podiatrist at least yearly or according to recommendations, and advised to stay away from smoking. I have recommended yearly flu vaccine and pneumonia vaccine at least every 5 years; moderate intensity exercise for up to 150 minutes weekly; and sleep for at least 7 hours a day.  - he is advised to maintain close follow up with Richardean Chimera, MD for primary care needs, as well as his other providers for optimal and coordinated care.     I spent  49  minutes in the care of the patient today including review of labs from CMP, Lipids, Thyroid Function, Hematology (current and previous including abstractions from other facilities); face-to-face time discussing  his blood glucose readings/logs, discussing hypoglycemia and hyperglycemia episodes and symptoms, medications doses, his options of short and long term treatment based on the latest standards of care / guidelines;  discussion about incorporating lifestyle medicine;  and documenting the encounter. Risk reduction counseling performed per USPSTF guidelines to reduce obesity and cardiovascular risk factors.     Please refer to Patient Instructions for Blood Glucose Monitoring and Insulin/Medications Dosing Guide"  in media tab for additional information. Please  also refer to " Patient Self Inventory" in the Media  tab for reviewed elements of pertinent patient history.  Kara Mead participated in the discussions, expressed understanding, and voiced agreement with the above plans.  All questions were answered to his satisfaction. he is encouraged to contact clinic should he have any questions or concerns  prior to his return visit.     Follow up plan: - Return in about 3 months (around 07/21/2023) for Diabetes F/U with A1c in office, Previsit labs, Bring meter and logs.    Ronny Bacon, [facility] [address] [place], [place] [address] Phone: [phone] Fax: [phone]  04/20/2023, 2:04 PM

## 2023-04-27 ENCOUNTER — Other Ambulatory Visit: Payer: Self-pay | Admitting: Nurse Practitioner

## 2023-04-27 DIAGNOSIS — E1151 Type 2 diabetes mellitus with diabetic peripheral angiopathy without gangrene: Secondary | ICD-10-CM

## 2023-05-23 NOTE — Progress Notes (Unsigned)
[name] [name] [name], male    DOB: 10/24/1955   MRN: [id number]   Brief patient profile:  46  yowm retired truck driver  quit smoking [id number] cause worried about his CAD at wt 240s referred to pulmonary clinic 10/09/2022 by Dr [name] [name] [name]  for new pattern of doe/cp since summer 2023 > stents did not correct the breathing though cp's resolved then even worse with URI Nov 2023.      [facility] 2021  Jan  covid x 17 days not on vent d/c on 02 and off it w/in 2 weeks   History of Present Illness  10/09/2022  Pulmonary/ 1st [name] [name] eval/[name] [name] [name]  Chief Complaint  Patient presents with   Consult    SOB and feeling tired with exertion.  Some dizziness and tinnitus x 6 months   Dyspnea:  walking to shop is about 75 ft and gives out  Cough: none  Sleep: bed is flat one pillow SABA use: may have helped some  Prednisone may have helped but never 100%  Covid :  all but the last one Rec Only use your albuterol as a rescue medication  Also  Ok to try albuterol 15 min before an activity (on alternating days)  that you know would usually make you short of breath To get the most out of exercise, you need to be continuously aware that you are short of breath, but never out of breath, for at least 30 minutes daily. Make sure you check your oxygen saturations at highest level of activity  Please schedule a follow up [name] [name] visit in 6 weeks, call sooner if needed  - [place] [name] [name] - bring inhaler   11/23/2022  f/u ov/[name] [name] [name] re: doe ? Etiology maint on saba pre ex   Chief Complaint  Patient presents with   Follow-up    Breathing is better since last ov   Dyspnea:  [place] - 15 min min sob  Cough: none  Sleeping: flat is flat / one pillow / on side  SABA use: no more than 2 puffs a day  02: none Covid status:   all but last Lung cancer screening :  referred   Rec Also  Ok to try albuterol 15 min before an activity (on alternating days)  that you know would usually make you short of breath            05/25/2023  f/u ov/[name] [name] [name]/[name] [name] [name] re: doe maint on *** LCS *** No chief complaint on file.   Dyspnea:  *** Cough: *** Sleeping: ***   resp cc  SABA use: *** 02: ***  Lung cancer screening: ***   No obvious day to day or daytime variability or assoc excess/ purulent sputum or mucus plugs or hemoptysis or cp or chest tightness, subjective wheeze or overt sinus or hb symptoms.    Also denies any obvious fluctuation of symptoms with weather or environmental changes or other aggravating or alleviating factors except as outlined above   No unusual exposure hx or h/o childhood pna/ asthma or knowledge of premature birth.  Current Allergies, Complete Past Medical History, Past Surgical History, Family History, and Social History were reviewed in [employer] record.  ROS  The following are not active complaints unless bolded Hoarseness, sore throat, dysphagia, dental problems, itching, sneezing,  nasal congestion or discharge of excess mucus or purulent secretions, ear ache,   fever, chills, sweats, unintended wt loss or wt gain, classically pleuritic or exertional cp,  orthopnea pnd or arm/hand swelling  or leg swelling, presyncope, palpitations, abdominal pain, anorexia, nausea, vomiting, diarrhea  or change in bowel habits or change in bladder habits, change in stools or change in urine, dysuria, hematuria,  rash, arthralgias, visual complaints, headache, numbness, weakness or ataxia or problems with walking or coordination,  change in mood or  memory.        No outpatient medications have been marked as taking for the 05/25/23 encounter (Appointment) with [name] [name] [name], [name] [name] [name].               Past Medical History:  Diagnosis Date   Arthritis    Cholelithiasis    Coronary atherosclerosis of native coronary artery    a. s/p DES to RCA in 2005 b. cath in 03/2022 showing patent distal RCA stent with 90% stenosis along proximal-mid RCA treated with PCI/DES  placement   Essential hypertension    Hyperlipidemia    OA (osteoarthritis)    Stroke (HCC)    Left pons 6/11 - Aggrenox started   Testicular cancer (HCC) 09/28/1976   Type 2 diabetes mellitus (HCC)    Urolithiasis          Objective:    Wts  05/25/2023       ***  11/23/22 250 lb 9.6 oz (113.7 kg)  10/09/22 247 lb 3.2 oz (112.1 kg)  10/06/22 246 lb 3.2 oz (111.7 kg)    Vital signs reviewed  05/25/2023  - Note at rest 02 sats  ***% on ***   General appearance:    ***          Assessment

## 2023-05-24 ENCOUNTER — Other Ambulatory Visit: Payer: Self-pay | Admitting: Nurse Practitioner

## 2023-05-25 ENCOUNTER — Encounter: Payer: Self-pay | Admitting: Internal Medicine

## 2023-05-25 ENCOUNTER — Ambulatory Visit: Payer: Medicare Other | Admitting: Internal Medicine

## 2023-05-25 VITALS — BP 116/62 | HR 87 | Ht 72.0 in | Wt 237.0 lb

## 2023-05-25 DIAGNOSIS — R0609 Other forms of dyspnea: Secondary | ICD-10-CM | POA: Diagnosis not present

## 2023-05-25 DIAGNOSIS — K219 Gastro-esophageal reflux disease without esophagitis: Secondary | ICD-10-CM | POA: Diagnosis not present

## 2023-05-25 DIAGNOSIS — Z87891 Personal history of nicotine dependence: Secondary | ICD-10-CM

## 2023-05-25 NOTE — Assessment & Plan Note (Signed)
Quit smoking  2011 at wt 240s Jan  2021  covid x 17 days not on vent d/c on 02 and off it w/in 2 weeks Onset summer 2023  - LHC 04/16/22  nl pressures/ function/ successful PCI/stents  - PFT's  09/11/22 @ wt 240   FEV1 3.21 (89 % ) ratio 0.85  p 2 % improvement from saba p 0 prior to study with DLCO  19.61 (70%)   and FV curve nl  - 10/09/2022   Walked on RA  x  3  lap(s) =  approx 750  ft  @ mod fast  pace, stopped due to end of study with lowest 02 sats 97% s sob or cp  - rec: 10/09/2022 = regular sub max ex min 30 min daily and check sats at peak ex and f/u in 6 weeks to consider cpst if not improving   - 11/23/2022  After extensive coaching inhaler device,  effectiveness =    80%  - 05/25/2023 pt reported no benefit from hfa so stopped it > pulmonary f/u prn   Doing great, responded nicely to regular sub max ex/ wt loss c/w obesity/ decondiitoning post covid >>>  no need for pulmonary f/u

## 2023-05-25 NOTE — Assessment & Plan Note (Signed)
Quit smoking 2011 so eligible for LCS until 2016 > referred   Discussed in detail all the  indications, usual  risks and alternatives  relative to the benefits with patient who agrees to proceed with w/u as outlined.

## 2023-05-25 NOTE — Patient Instructions (Signed)
My office will be contacting you by phone for referral to Lung cancer Screening   - if you don't hear back from my office within one week please call us back or notify us thru MyChart and we'll address it right away.   GERD (REFLUX)  is an extremely common cause of respiratory symptoms just like yours , many times with no obvious heartburn at all.    It can be treated with medication, but also with lifestyle changes including elevation of the head of your bed (ideally with 6-8inch blocks under the headboard of your bed),  Smoking cessation, avoidance of late meals, excessive alcohol, and avoid fatty foods, chocolate, peppermint, colas, red wine, and acidic juices such as orange juice.  NO MINT OR MENTHOL PRODUCTS SO NO COUGH DROPS  USE SUGARLESS CANDY INSTEAD (Jolley ranchers or Stover's or Life Savers) or even ice chips will also do - the key is to swallow to prevent all throat clearing. NO OIL BASED VITAMINS - use powdered substitutes.  Avoid fish oil when coughing.   Take PPi Take 30-60 min before first meal of the day   Pulmonary follow up is as needed

## 2023-05-25 NOTE — Assessment & Plan Note (Addendum)
Reported 05/25/2023  rx with ppi prn   Advised to take ppi ac and rx with diet/ bed blocks, continued wt loss and f/u PCP/ GIprn           Each maintenance medication was reviewed in detail including emphasizing most importantly the difference between maintenance and prns and under what circumstances the prns are to be triggered using an action plan format where appropriate.  Total time for H and P, chart review, counseling, r and generating customized AVS unique to this office visit / same day charting = 30 min summary f/u ov

## 2023-06-01 ENCOUNTER — Other Ambulatory Visit: Payer: Self-pay | Admitting: *Deleted

## 2023-06-01 DIAGNOSIS — Z87891 Personal history of nicotine dependence: Secondary | ICD-10-CM

## 2023-06-01 DIAGNOSIS — Z122 Encounter for screening for malignant neoplasm of respiratory organs: Secondary | ICD-10-CM

## 2023-06-08 DIAGNOSIS — H524 Presbyopia: Secondary | ICD-10-CM | POA: Diagnosis not present

## 2023-06-15 DIAGNOSIS — R251 Tremor, unspecified: Secondary | ICD-10-CM | POA: Diagnosis not present

## 2023-06-15 DIAGNOSIS — R269 Unspecified abnormalities of gait and mobility: Secondary | ICD-10-CM | POA: Diagnosis not present

## 2023-06-15 DIAGNOSIS — I2510 Atherosclerotic heart disease of native coronary artery without angina pectoris: Secondary | ICD-10-CM | POA: Diagnosis not present

## 2023-06-15 DIAGNOSIS — E1159 Type 2 diabetes mellitus with other circulatory complications: Secondary | ICD-10-CM | POA: Diagnosis not present

## 2023-06-16 DIAGNOSIS — R531 Weakness: Secondary | ICD-10-CM | POA: Diagnosis not present

## 2023-06-16 DIAGNOSIS — R269 Unspecified abnormalities of gait and mobility: Secondary | ICD-10-CM | POA: Diagnosis not present

## 2023-06-16 DIAGNOSIS — R251 Tremor, unspecified: Secondary | ICD-10-CM | POA: Diagnosis not present

## 2023-06-17 ENCOUNTER — Ambulatory Visit: Payer: Medicare Other | Admitting: Neurology

## 2023-06-17 ENCOUNTER — Encounter: Payer: Self-pay | Admitting: Neurology

## 2023-06-17 VITALS — BP 142/77 | HR 77 | Ht 72.0 in | Wt 237.0 lb

## 2023-06-17 DIAGNOSIS — G250 Essential tremor: Secondary | ICD-10-CM | POA: Insufficient documentation

## 2023-06-17 DIAGNOSIS — Z7984 Long term (current) use of oral hypoglycemic drugs: Secondary | ICD-10-CM

## 2023-06-17 DIAGNOSIS — E1142 Type 2 diabetes mellitus with diabetic polyneuropathy: Secondary | ICD-10-CM | POA: Diagnosis not present

## 2023-06-17 MED ORDER — PROPRANOLOL HCL 20 MG PO TABS
20.0000 mg | ORAL_TABLET | Freq: Two times a day (BID) | ORAL | 6 refills | Status: DC | PRN
Start: 2023-06-17 — End: 2023-12-20

## 2023-06-17 NOTE — Progress Notes (Signed)
Chief Complaint  Patient presents with   New Patient (Initial Visit)    Rm15, wife present, Tremors, pain in neck, sensitive skin to the touch, unsteady gait, balance problems       ASSESSMENT AND PLAN  [name] [name] is a 67 y.o. male   Diabetic peripheral neuropathy  Length-dependent sensory changes, no significant muscle weakness, A1c was 8, advised tighter control of his diabetes   Essential tremor,  Only mild symptomatic, strong family history of such,  Check TSH,  Propranolol as needed  DIAGNOSTIC DATA (LABS, IMAGING, TESTING) - I reviewed patient records, labs, notes, testing and imaging myself where available.   MEDICAL HISTORY:  [name] [name] is a 67 year old male accompanied by his wife, seen in request by his primary care physician at [facility] Dr. Mayford Knife, for evaluation of tremor, intermittent whole body paresthesia, initial evaluation June 17, 2023  I reviewed and summarized the referring note. PMHX DM for more than 20 years. HLD CVA CAD Testicular cancer, s/p surgery and chemotherat Kidney stone  He has strong family history of essential tremor, siblings and his father suffered tremor, since 2024, he noticed intermittent bilateral hand shaking, especially when holding utensils, a couple of drinks, no major limitation in his daily activity  He denied loss sense of smell, denies REM sleep disorder, no gait abnormality  In addition, over past few months he has intermittent whole body hypersensitivity, lasting for few minutes, described spreading numb tingly sensation throughout his whole body   Lab in June 2024, CPK 64, CMP, sodium 133, creatinine 1.18, CBC hemoglobin of 15.3, decreased CBC 12.4,     PHYSICAL EXAM:   Vitals:   06/17/23 1136  BP: (!) 142/77  Pulse: 77  Weight: 237 lb (107.5 kg)  Height: 6' (1.829 m)   Not recorded     Body mass index is 32.14 kg/m.  PHYSICAL EXAMNIATION:  Gen: NAD, conversant,  well nourised, well groomed                     Cardiovascular: Regular rate rhythm, no peripheral edema, warm, nontender. Eyes: Conjunctivae clear without exudates or hemorrhage Neck: Supple, no carotid bruits. Pulmonary: Clear to auscultation bilaterally   NEUROLOGICAL EXAM:  MENTAL STATUS: Speech/cognition: Awake, alert, oriented to history taking and casual conversation CRANIAL NERVES: CN II: Visual fields are full to confrontation. Pupils are round equal and briskly reactive to light. CN III, IV, VI: extraocular movement are normal. No ptosis. CN V: Facial sensation is intact to light touch CN VII: Face is symmetric with normal eye closure  CN VIII: Hearing is normal to causal conversation. CN IX, X: Phonation is normal. CN XI: Head turning and shoulder shrug are intact  MOTOR: There is no pronator drift of out-stretched arms. Muscle bulk and tone are normal. Muscle strength is normal.  REFLEXES: Reflexes are 1 and symmetric at the biceps, triceps, absent at knees, and ankles. Plantar responses are flexor.  SENSORY: Length-dependent decreased light touch, pinprick, vibratory sensation to midshin level, receding of hairline to mid shin   COORDINATION: There is no trunk or limb dysmetria noted.  GAIT/STANCE: Posture is normal. Gait is steady with normal steps, base, arm swing, and turning. Heel and toe walking are normal. Tandem gait is normal.  Romberg is absent.  REVIEW OF SYSTEMS:  Full 14 system review of systems performed and notable only for as above All other review of systems were negative.   ALLERGIES: No Known Allergies  HOME MEDICATIONS: Current Outpatient Medications  Medication Sig Dispense Refill   albuterol (VENTOLIN HFA) 108 (90 Base) MCG/ACT inhaler SMARTSIG:2 Puff(s) Via Inhaler 4 Times Daily PRN     aspirin EC 81 MG tablet Take 1 tablet (81 mg total) by mouth daily. 90 tablet 3   Blood Glucose Monitoring Suppl (ACCU-CHEK GUIDE ME) w/Device KIT  Use to monitor glucose twice daily. 1 kit 0   Blood Glucose Monitoring Suppl (ONETOUCH VERIO) w/Device KIT Use to test BG qid. E11.65 1 kit 0   fish oil-omega-3 fatty acids 1000 MG capsule Take 1 g by mouth 2 (two) times daily.     glipiZIDE (GLUCOTROL XL) 5 MG 24 hr tablet Take 1 tablet (5 mg total) by mouth daily with breakfast. 90 tablet 3   glucose blood (ONETOUCH VERIO) test strip 1 each by Other route 2 (two) times daily. 100 each 3   Lancets MISC 1 each by Does not apply route in the morning and at bedtime. 100 each 3   metFORMIN (GLUCOPHAGE-XR) 500 MG 24 hr tablet TAKE 1 TABLET(500 MG) BY MOUTH TWICE DAILY WITH A MEAL 180 tablet 0   nitroGLYCERIN (NITROSTAT) 0.4 MG SL tablet DISSOLVE 1 TABLET UNDER THE TONGUE EVERY 5 MINUTES FOR 3 DOSES AS NEEDED. MAY REPEAT EVERY 5 MINUTES FOR UP TO 3 DOSES. 25 tablet 3   NON FORMULARY Omega XL twice daily     OZEMPIC, 2 MG/DOSE, 8 MG/3ML SOPN INJECT 2MG  UNDER THE SKIN ONCE A WEEK 3 mL 3   pantoprazole (PROTONIX) 40 MG tablet Take 1 tablet (40 mg total) by mouth daily. 30 tablet 1   simvastatin (ZOCOR) 40 MG tablet Take 1 tablet (40 mg total) by mouth every evening. 90 tablet 3   No current facility-administered medications for this visit.    PAST MEDICAL HISTORY: Past Medical History:  Diagnosis Date   Arthritis    Cholelithiasis    Coronary atherosclerosis of native coronary artery    a. s/p DES to RCA in 2005 b. 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   Type 2 diabetes mellitus (HCC)    Urolithiasis     PAST SURGICAL HISTORY: Past Surgical History:  Procedure Laterality Date   Colonoscopy     COLONOSCOPY     CORONARY STENT INTERVENTION N/A 04/16/2022   Procedure: CORONARY STENT INTERVENTION;  Surgeon:  Swaziland, Peter M, MD;  Location: [facility] INVASIVE CV LAB;  Service: Cardiovascular;  Laterality: N/A;   CORONARY STENT PLACEMENT  2005   LEFT HEART CATH AND CORONARY ANGIOGRAPHY N/A 04/16/2022   Procedure: LEFT HEART CATH AND CORONARY ANGIOGRAPHY;  Surgeon: Swaziland, Peter M, MD;  Location[facility]VASIVE CV LAB;  Service: Cardiovascular;  Laterality: N/A;   Testicular tumor resection     Right side    FAMILY HISTORY: Family History  Problem Relation Age of Onset   Heart disease Father    Diabetes Father    Heart attack Father    Colon cancer Neg Hx    Esophageal cancer Neg Hx    Rectal cancer Neg Hx    Stomach cancer Neg Hx    Colon polyps Neg Hx     SOCIAL HISTORY: Social History   Socioeconomic History   Marital status:  Married    Spouse name: [name]   Number of children: 0   Years of education: Not on file   Highest education level: Not on file  Occupational History   Occupation: Truck driver  Tobacco Use   Smoking status: Former    Current packs/day: 0.00    Average packs/day: 1.5 packs/day for 43.0 years (64.5 ttl pk-yrs)    Types: Cigarettes    Start date: 04/22/1967    Quit date: 04/21/2010    Years since quitting: 13.1   Smokeless tobacco: Never  Vaping Use   Vaping status: Never Used  Substance and Sexual Activity   Alcohol use: Yes    Comment: occasionally   Drug use: No   Sexual activity: Not on file  Other Topics Concern   Not on file  Social History Narrative   Not on file   Social Determinants of Health   Financial Resource Strain: Not on file  Food Insecurity: Low Risk  (01/19/2023)   [facility]   Hunger Vital Sign    Worried About Running Out of Food in the Last Year: Never true    Ran Out of Food in the Last Year: Never true  Transportation Needs: Not on file (01/19/2023)  Physical Activity: Not on file  Stress: Not on file  Social Connections: Not on file  Intimate Partner Violence: Not on file      Levert Feinstein,  M.D. Ph.D.  [facility] [address] [place], [place] [address] Ph: [phone] Fax: [phone]  CC:  Donetta Potts, MD [address] [place],  [place] [address]  Richardean Chimera, MD

## 2023-06-18 LAB — ANA W/REFLEX IF POSITIVE
Anti JO-1: <0.2 AI (ref 0.0–0.9)
Anti Nuclear Antibody (ANA): POSITIVE — AB
Centromere Ab Screen: <0.2 AI (ref 0.0–0.9)
Chromatin Ab SerPl-aCnc: <0.2 AI (ref 0.0–0.9)
ENA RNP Ab: 1.3 AI — ABNORMAL HIGH (ref 0.0–0.9)
ENA SM Ab Ser-aCnc: <0.2 AI (ref 0.0–0.9)
ENA SSA (RO) Ab: <0.2 AI (ref 0.0–0.9)
ENA SSB (LA) Ab: <0.2 AI (ref 0.0–0.9)
Scleroderma (Scl-70) (ENA) Antibody, IgG: <0.2 AI (ref 0.0–0.9)
dsDNA Ab: 1 IU/mL (ref 0–9)

## 2023-06-18 LAB — TSH: TSH: 2.1 u[IU]/mL (ref 0.450–4.500)

## 2023-06-18 LAB — C-REACTIVE PROTEIN: CRP: <1 mg/L (ref 0–10)

## 2023-06-18 LAB — SEDIMENTATION RATE: Sed Rate: 2 mm/hr (ref 0–30)

## 2023-06-21 ENCOUNTER — Encounter (HOSPITAL_COMMUNITY): Payer: Self-pay | Admitting: Emergency Medicine

## 2023-06-21 ENCOUNTER — Inpatient Hospital Stay (HOSPITAL_COMMUNITY)
Admission: EM | Admit: 2023-06-21 | Discharge: 2023-06-23 | DRG: 661 | Disposition: A | Discharge status: Home / Self Care | Payer: Medicare Other | Attending: Internal Medicine | Admitting: Internal Medicine

## 2023-06-21 ENCOUNTER — Other Ambulatory Visit: Payer: Self-pay

## 2023-06-21 ENCOUNTER — Emergency Department (HOSPITAL_COMMUNITY): Payer: Medicare Other

## 2023-06-21 DIAGNOSIS — K219 Gastro-esophageal reflux disease without esophagitis: Secondary | ICD-10-CM | POA: Diagnosis not present

## 2023-06-21 DIAGNOSIS — Z87891 Personal history of nicotine dependence: Secondary | ICD-10-CM

## 2023-06-21 DIAGNOSIS — Z955 Presence of coronary angioplasty implant and graft: Secondary | ICD-10-CM | POA: Diagnosis not present

## 2023-06-21 DIAGNOSIS — Z7985 Long-term (current) use of injectable non-insulin antidiabetic drugs: Secondary | ICD-10-CM

## 2023-06-21 DIAGNOSIS — Z87442 Personal history of urinary calculi: Secondary | ICD-10-CM | POA: Diagnosis not present

## 2023-06-21 DIAGNOSIS — N261 Atrophy of kidney (terminal): Secondary | ICD-10-CM | POA: Diagnosis present

## 2023-06-21 DIAGNOSIS — I10 Essential (primary) hypertension: Secondary | ICD-10-CM | POA: Diagnosis present

## 2023-06-21 DIAGNOSIS — Z6831 Body mass index (BMI) 31.0-31.9, adult: Secondary | ICD-10-CM

## 2023-06-21 DIAGNOSIS — N201 Calculus of ureter: Secondary | ICD-10-CM | POA: Diagnosis not present

## 2023-06-21 DIAGNOSIS — N136 Pyonephrosis: Principal | ICD-10-CM | POA: Diagnosis present

## 2023-06-21 DIAGNOSIS — Z7984 Long term (current) use of oral hypoglycemic drugs: Secondary | ICD-10-CM

## 2023-06-21 DIAGNOSIS — Z7982 Long term (current) use of aspirin: Secondary | ICD-10-CM | POA: Diagnosis not present

## 2023-06-21 DIAGNOSIS — N132 Hydronephrosis with renal and ureteral calculous obstruction: Secondary | ICD-10-CM | POA: Diagnosis not present

## 2023-06-21 DIAGNOSIS — Z833 Family history of diabetes mellitus: Secondary | ICD-10-CM | POA: Diagnosis not present

## 2023-06-21 DIAGNOSIS — Z79899 Other long term (current) drug therapy: Secondary | ICD-10-CM | POA: Diagnosis not present

## 2023-06-21 DIAGNOSIS — E785 Hyperlipidemia, unspecified: Secondary | ICD-10-CM | POA: Diagnosis present

## 2023-06-21 DIAGNOSIS — N390 Urinary tract infection, site not specified: Secondary | ICD-10-CM | POA: Diagnosis not present

## 2023-06-21 DIAGNOSIS — Z8547 Personal history of malignant neoplasm of testis: Secondary | ICD-10-CM

## 2023-06-21 DIAGNOSIS — M1990 Unspecified osteoarthritis, unspecified site: Secondary | ICD-10-CM | POA: Diagnosis not present

## 2023-06-21 DIAGNOSIS — Z8249 Family history of ischemic heart disease and other diseases of the circulatory system: Secondary | ICD-10-CM | POA: Diagnosis not present

## 2023-06-21 DIAGNOSIS — E119 Type 2 diabetes mellitus without complications: Secondary | ICD-10-CM | POA: Diagnosis present

## 2023-06-21 DIAGNOSIS — Z8673 Personal history of transient ischemic attack (TIA), and cerebral infarction without residual deficits: Secondary | ICD-10-CM | POA: Diagnosis not present

## 2023-06-21 DIAGNOSIS — N179 Acute kidney failure, unspecified: Secondary | ICD-10-CM | POA: Diagnosis not present

## 2023-06-21 DIAGNOSIS — E669 Obesity, unspecified: Secondary | ICD-10-CM | POA: Diagnosis not present

## 2023-06-21 DIAGNOSIS — N281 Cyst of kidney, acquired: Secondary | ICD-10-CM | POA: Diagnosis not present

## 2023-06-21 DIAGNOSIS — I2510 Atherosclerotic heart disease of native coronary artery without angina pectoris: Secondary | ICD-10-CM | POA: Diagnosis present

## 2023-06-21 DIAGNOSIS — N2889 Other specified disorders of kidney and ureter: Secondary | ICD-10-CM | POA: Diagnosis not present

## 2023-06-21 DIAGNOSIS — R109 Unspecified abdominal pain: Secondary | ICD-10-CM | POA: Diagnosis not present

## 2023-06-21 LAB — BASIC METABOLIC PANEL
Anion gap: 8 (ref 5–15)
BUN: 23 mg/dL (ref 8–23)
CO2: 27 mmol/L (ref 22–32)
Calcium: 9.4 mg/dL (ref 8.9–10.3)
Chloride: 102 mmol/L (ref 98–111)
Creatinine, Ser: 1.16 mg/dL (ref 0.61–1.24)
GFR, Estimated: >60 mL/min (ref >60)
Glucose, Bld: 158 mg/dL — ABNORMAL HIGH (ref 70–99)
Potassium: 4.9 mmol/L (ref 3.5–5.1)
Sodium: 137 mmol/L (ref 135–145)

## 2023-06-21 LAB — CBC
HCT: 45.9 % (ref 39.0–52.0)
Hemoglobin: 15.9 g/dL (ref 13.0–17.0)
MCH: 29.7 pg (ref 26.0–34.0)
MCHC: 34.6 g/dL (ref 30.0–36.0)
MCV: 85.8 fL (ref 80.0–100.0)
Platelets: 168 10*3/uL (ref 150–400)
RBC: 5.35 MIL/uL (ref 4.22–5.81)
RDW: 13.2 % (ref 11.5–15.5)
WBC: 13 10*3/uL — ABNORMAL HIGH (ref 4.0–10.5)
nRBC: 0 % (ref 0.0–0.2)

## 2023-06-21 NOTE — ED Triage Notes (Signed)
Pt complains of right flank pain with vomiting and difficulty urinating started today. History of kidney stones.

## 2023-06-22 ENCOUNTER — Ambulatory Visit: Payer: Medicare Other | Admitting: Neurology

## 2023-06-22 ENCOUNTER — Observation Stay (HOSPITAL_COMMUNITY): Payer: Medicare Other | Admitting: Anesthesiology

## 2023-06-22 ENCOUNTER — Encounter (HOSPITAL_COMMUNITY): Payer: Self-pay | Admitting: Internal Medicine

## 2023-06-22 ENCOUNTER — Observation Stay (HOSPITAL_COMMUNITY): Payer: Medicare Other

## 2023-06-22 ENCOUNTER — Encounter (HOSPITAL_COMMUNITY)
Admission: EM | Disposition: A | Discharge status: Home / Self Care | Payer: Self-pay | Source: Home / Self Care | Attending: Internal Medicine

## 2023-06-22 DIAGNOSIS — E119 Type 2 diabetes mellitus without complications: Secondary | ICD-10-CM | POA: Diagnosis present

## 2023-06-22 DIAGNOSIS — Z87442 Personal history of urinary calculi: Secondary | ICD-10-CM | POA: Diagnosis not present

## 2023-06-22 DIAGNOSIS — I2510 Atherosclerotic heart disease of native coronary artery without angina pectoris: Secondary | ICD-10-CM | POA: Diagnosis present

## 2023-06-22 DIAGNOSIS — N179 Acute kidney failure, unspecified: Secondary | ICD-10-CM | POA: Diagnosis present

## 2023-06-22 DIAGNOSIS — M1990 Unspecified osteoarthritis, unspecified site: Secondary | ICD-10-CM | POA: Diagnosis present

## 2023-06-22 DIAGNOSIS — I10 Essential (primary) hypertension: Secondary | ICD-10-CM

## 2023-06-22 DIAGNOSIS — N201 Calculus of ureter: Secondary | ICD-10-CM

## 2023-06-22 DIAGNOSIS — Z833 Family history of diabetes mellitus: Secondary | ICD-10-CM | POA: Diagnosis not present

## 2023-06-22 DIAGNOSIS — E785 Hyperlipidemia, unspecified: Secondary | ICD-10-CM | POA: Diagnosis present

## 2023-06-22 DIAGNOSIS — Z7982 Long term (current) use of aspirin: Secondary | ICD-10-CM | POA: Diagnosis not present

## 2023-06-22 DIAGNOSIS — N390 Urinary tract infection, site not specified: Secondary | ICD-10-CM | POA: Diagnosis present

## 2023-06-22 DIAGNOSIS — Z8547 Personal history of malignant neoplasm of testis: Secondary | ICD-10-CM | POA: Diagnosis not present

## 2023-06-22 DIAGNOSIS — R109 Unspecified abdominal pain: Secondary | ICD-10-CM | POA: Diagnosis present

## 2023-06-22 DIAGNOSIS — K219 Gastro-esophageal reflux disease without esophagitis: Secondary | ICD-10-CM | POA: Diagnosis present

## 2023-06-22 DIAGNOSIS — N261 Atrophy of kidney (terminal): Secondary | ICD-10-CM | POA: Diagnosis present

## 2023-06-22 DIAGNOSIS — Z955 Presence of coronary angioplasty implant and graft: Secondary | ICD-10-CM | POA: Diagnosis not present

## 2023-06-22 DIAGNOSIS — Z7984 Long term (current) use of oral hypoglycemic drugs: Secondary | ICD-10-CM

## 2023-06-22 DIAGNOSIS — Z87891 Personal history of nicotine dependence: Secondary | ICD-10-CM | POA: Diagnosis not present

## 2023-06-22 DIAGNOSIS — Z8673 Personal history of transient ischemic attack (TIA), and cerebral infarction without residual deficits: Secondary | ICD-10-CM | POA: Diagnosis not present

## 2023-06-22 DIAGNOSIS — Z6831 Body mass index (BMI) 31.0-31.9, adult: Secondary | ICD-10-CM | POA: Diagnosis not present

## 2023-06-22 DIAGNOSIS — Z8249 Family history of ischemic heart disease and other diseases of the circulatory system: Secondary | ICD-10-CM | POA: Diagnosis not present

## 2023-06-22 DIAGNOSIS — N136 Pyonephrosis: Secondary | ICD-10-CM | POA: Diagnosis present

## 2023-06-22 DIAGNOSIS — Z7985 Long-term (current) use of injectable non-insulin antidiabetic drugs: Secondary | ICD-10-CM | POA: Diagnosis not present

## 2023-06-22 DIAGNOSIS — E669 Obesity, unspecified: Secondary | ICD-10-CM | POA: Diagnosis present

## 2023-06-22 DIAGNOSIS — Z79899 Other long term (current) drug therapy: Secondary | ICD-10-CM | POA: Diagnosis not present

## 2023-06-22 HISTORY — PX: STONE EXTRACTION WITH BASKET: SHX5318

## 2023-06-22 HISTORY — PX: CYSTOSCOPY W/ URETERAL STENT PLACEMENT: SHX1429

## 2023-06-22 LAB — GLUCOSE, CAPILLARY
Glucose-Capillary: 114 mg/dL — ABNORMAL HIGH (ref 70–99)
Glucose-Capillary: 117 mg/dL — ABNORMAL HIGH (ref 70–99)
Glucose-Capillary: 128 mg/dL — ABNORMAL HIGH (ref 70–99)
Glucose-Capillary: 133 mg/dL — ABNORMAL HIGH (ref 70–99)
Glucose-Capillary: 191 mg/dL — ABNORMAL HIGH (ref 70–99)

## 2023-06-22 LAB — URINALYSIS, ROUTINE W REFLEX MICROSCOPIC
Glucose, UA: 100 mg/dL — AB
Nitrite: POSITIVE — AB
Protein, ur: 100 mg/dL — AB
RBC / HPF: >50 RBC/hpf (ref 0–5)
Specific Gravity, Urine: 1.02 (ref 1.005–1.030)
pH: 6.5 (ref 5.0–8.0)

## 2023-06-22 LAB — BASIC METABOLIC PANEL
Anion gap: 9 (ref 5–15)
BUN: 28 mg/dL — ABNORMAL HIGH (ref 8–23)
CO2: 23 mmol/L (ref 22–32)
Calcium: 8.4 mg/dL — ABNORMAL LOW (ref 8.9–10.3)
Chloride: 102 mmol/L (ref 98–111)
Creatinine, Ser: 2.38 mg/dL — ABNORMAL HIGH (ref 0.61–1.24)
GFR, Estimated: 29 mL/min — ABNORMAL LOW (ref >60)
Glucose, Bld: 136 mg/dL — ABNORMAL HIGH (ref 70–99)
Potassium: 4.2 mmol/L (ref 3.5–5.1)
Sodium: 134 mmol/L — ABNORMAL LOW (ref 135–145)

## 2023-06-22 SURGERY — CYSTOSCOPY, WITH RETROGRADE PYELOGRAM AND URETERAL STENT INSERTION
Anesthesia: General | Site: Ureter | Laterality: Right

## 2023-06-22 MED ORDER — SODIUM CHLORIDE 0.9 % IV SOLN
2.0000 g | Freq: Once | INTRAVENOUS | Status: AC
  Administered 2023-06-22: 2 g via INTRAVENOUS
  Filled 2023-06-22: qty 20

## 2023-06-22 MED ORDER — LIDOCAINE HCL (CARDIAC) PF 100 MG/5ML IV SOSY
PREFILLED_SYRINGE | INTRAVENOUS | Status: DC | PRN
  Administered 2023-06-22: 100 mg via INTRAVENOUS

## 2023-06-22 MED ORDER — ONDANSETRON HCL 4 MG/2ML IJ SOLN: 4.0000 mg | Freq: Once | INTRAMUSCULAR | Status: DC | PRN

## 2023-06-22 MED ORDER — ACETAMINOPHEN 325 MG PO TABS: 650.0000 mg | ORAL_TABLET | Freq: Four times a day (QID) | ORAL | Status: DC | PRN

## 2023-06-22 MED ORDER — STERILE WATER FOR IRRIGATION IR SOLN
Status: DC | PRN
  Administered 2023-06-22: 500 mL

## 2023-06-22 MED ORDER — HYDROMORPHONE HCL 1 MG/ML IJ SOLN
1.0000 mg | INTRAMUSCULAR | Status: DC | PRN
  Administered 2023-06-22: 1 mg via INTRAVENOUS
  Filled 2023-06-22: qty 1

## 2023-06-22 MED ORDER — CEFAZOLIN SODIUM-DEXTROSE 2-4 GM/100ML-% IV SOLN
2.0000 g | Freq: Once | INTRAVENOUS | Status: AC
  Administered 2023-06-22: 2 g via INTRAVENOUS
  Filled 2023-06-22: qty 100

## 2023-06-22 MED ORDER — SODIUM CHLORIDE 0.9 % IV SOLN
1.0000 g | Freq: Every day | INTRAVENOUS | Status: DC
  Administered 2023-06-23: 1 g via INTRAVENOUS
  Filled 2023-06-22: qty 10

## 2023-06-22 MED ORDER — CHLORHEXIDINE GLUCONATE 0.12 % MT SOLN
15.0000 mL | Freq: Once | OROMUCOSAL | Status: AC
  Administered 2023-06-22: 15 mL via OROMUCOSAL

## 2023-06-22 MED ORDER — ONDANSETRON HCL 4 MG/2ML IJ SOLN
INTRAMUSCULAR | Status: AC
  Filled 2023-06-22: qty 2

## 2023-06-22 MED ORDER — ONDANSETRON HCL 4 MG/2ML IJ SOLN
4.0000 mg | Freq: Four times a day (QID) | INTRAMUSCULAR | Status: DC | PRN
  Administered 2023-06-22: 4 mg via INTRAVENOUS
  Filled 2023-06-22: qty 2

## 2023-06-22 MED ORDER — HYDROMORPHONE HCL 1 MG/ML IJ SOLN: 0.5000 mg | INTRAMUSCULAR | Status: DC | PRN

## 2023-06-22 MED ORDER — INSULIN ASPART 100 UNIT/ML IJ SOLN
0.0000 [IU] | INTRAMUSCULAR | Status: DC
  Administered 2023-06-22 – 2023-06-23 (×3): 2 [IU] via SUBCUTANEOUS

## 2023-06-22 MED ORDER — SODIUM CHLORIDE 0.9 % IR SOLN
Status: DC | PRN
  Administered 2023-06-22: 6000 mL via INTRAVESICAL

## 2023-06-22 MED ORDER — PROPOFOL 10 MG/ML IV BOLUS
INTRAVENOUS | Status: DC | PRN
  Administered 2023-06-22: 150 mg via INTRAVENOUS

## 2023-06-22 MED ORDER — ACETAMINOPHEN 650 MG RE SUPP: 650.0000 mg | Freq: Four times a day (QID) | RECTAL | Status: DC | PRN

## 2023-06-22 MED ORDER — LACTATED RINGERS IV SOLN: INTRAVENOUS | Status: DC

## 2023-06-22 MED ORDER — CHLORHEXIDINE GLUCONATE 0.12 % MT SOLN: 15.0000 mL | Freq: Once | OROMUCOSAL | Status: DC

## 2023-06-22 MED ORDER — ONDANSETRON HCL 4 MG/2ML IJ SOLN
INTRAMUSCULAR | Status: DC | PRN
  Administered 2023-06-22: 4 mg via INTRAVENOUS

## 2023-06-22 MED ORDER — ORAL CARE MOUTH RINSE: 15.0000 mL | Freq: Once | OROMUCOSAL | Status: DC

## 2023-06-22 MED ORDER — FENTANYL CITRATE (PF) 250 MCG/5ML IJ SOLN
INTRAMUSCULAR | Status: DC | PRN
  Administered 2023-06-22 (×4): 25 ug via INTRAVENOUS

## 2023-06-22 MED ORDER — FENTANYL CITRATE (PF) 100 MCG/2ML IJ SOLN
INTRAMUSCULAR | Status: AC
  Filled 2023-06-22: qty 2

## 2023-06-22 MED ORDER — DIATRIZOATE MEGLUMINE 30 % UR SOLN
URETHRAL | Status: DC | PRN
  Administered 2023-06-22: 8 mL via URETHRAL

## 2023-06-22 MED ORDER — SODIUM CHLORIDE 0.9 % IV BOLUS
1000.0000 mL | Freq: Once | INTRAVENOUS | Status: AC
  Administered 2023-06-22: 1000 mL via INTRAVENOUS

## 2023-06-22 MED ORDER — DIATRIZOATE MEGLUMINE 30 % UR SOLN
URETHRAL | Status: AC
  Filled 2023-06-22: qty 100

## 2023-06-22 MED ORDER — FENTANYL CITRATE PF 50 MCG/ML IJ SOSY: 25.0000 ug | PREFILLED_SYRINGE | INTRAMUSCULAR | Status: DC | PRN

## 2023-06-22 SURGICAL SUPPLY — 22 items
BAG DRAIN URO TABLE W/ADPT NS (BAG) ×2 IMPLANT
BAG DRN 8 ADPR NS SKTRN CSTL (BAG) ×2
BAG HAMPER (MISCELLANEOUS) ×2 IMPLANT
CATH INTERMIT  6FR 70CM (CATHETERS) ×2 IMPLANT
CLOTH BEACON ORANGE TIMEOUT ST (SAFETY) ×2 IMPLANT
EXTRACTOR STONE NITINOL NGAGE (UROLOGICAL SUPPLIES) IMPLANT
GLOVE BIO SURGEON STRL SZ7 (GLOVE) IMPLANT
GLOVE BIO SURGEON STRL SZ8 (GLOVE) ×2 IMPLANT
GLOVE BIOGEL PI IND STRL 7.0 (GLOVE) ×4 IMPLANT
GOWN STRL REUS W/TWL LRG LVL3 (GOWN DISPOSABLE) ×2 IMPLANT
GOWN STRL REUS W/TWL XL LVL3 (GOWN DISPOSABLE) ×2 IMPLANT
GUIDEWIRE STR ZIPWIRE 035X150 (MISCELLANEOUS) ×2 IMPLANT
IV NS IRRIG 3000ML ARTHROMATIC (IV SOLUTION) ×2 IMPLANT
KIT TURNOVER CYSTO (KITS) ×2 IMPLANT
MANIFOLD NEPTUNE II (INSTRUMENTS) ×2 IMPLANT
PACK CYSTO (CUSTOM PROCEDURE TRAY) ×2 IMPLANT
PAD ARMBOARD 7.5X6 YLW CONV (MISCELLANEOUS) ×2 IMPLANT
POSITIONER HEAD 8X9X4 ADT (SOFTGOODS) ×2 IMPLANT
STENT URET 6FRX26 CONTOUR (STENTS) IMPLANT
SYR 10ML LL (SYRINGE) ×2 IMPLANT
TOWEL OR 17X26 4PK STRL BLUE (TOWEL DISPOSABLE) ×2 IMPLANT
WATER STERILE IRR 500ML POUR (IV SOLUTION) ×2 IMPLANT

## 2023-06-22 NOTE — Op Note (Signed)
Preoperative diagnosis: Right ureteral stone  Postoperative diagnosis: Same  Procedure: 1 cystoscopy 2 right retrograde pyelography 3.  Intraoperative fluoroscopy, under one hour, with interpretation 4.  Right ureteroscopic stone manipulation with basket extraction 5.  Right 6 x 26 JJ stent placement  Attending: Cleda Mccreedy  Anesthesia: General  Estimated blood loss: None  Drains: Right 6 x 26 JJ ureteral stent without tether  Specimens: stone for analysis  Antibiotics: Ancef  Findings: Moderate right hydronephrosis. 2-56mm right distal ureteral calculus.  Indications: Patient is a 67 year old male with a right ureteral stone and a solitary kidney. He developed acute renal failure..  After discussing treatment options, he decided proceed with right ureteroscopic stone manipulation.  Procedure in detail: The patient was brought to the operating room and a brief timeout was done to ensure correct patient, correct procedure, correct site.  General anesthesia was administered patient was placed in dorsal lithotomy position.  Her genitalia was then prepped and draped in usual sterile fashion.  A rigid 22 French cystoscope was passed in the urethra and the bladder.  Bladder was inspected free masses or lesions.  the right ureteral orifices were in the normal orthotopic locations.  a 6 french ureteral catheter was then instilled into the right ureter orifice.  a gentle retrograde was obtained and findings noted above.  we then placed a zip wire through the ureteral catheter and advanced up to the renal pelvis.  we then removed the cystoscope and cannulated the right ureteral orifice with a semirigid ureteroscope.  we then encountered the stone in the distal ureter which was removed with a Ngage basket.  once the stone was removed we then placed a 6 x 26 double-j ureteral stent over the original zip wire.We removed the wire and good coil was noted in the the renal pelvis under fluoroscopy and  the bladder under direct vision.      the bladder was then drained and this concluded the procedure which was well tolerated by patient.  Complications: None  Condition: Stable, extubated, transferred to PACU  Plan: Patient can be discharged home tomorrow and follow-up in one week for stent removal.

## 2023-06-22 NOTE — H&P (Signed)
History and Physical    [name] [name] NWG:[id number] DOB: 1955-12-06 DOA: 06/21/2023  PCP: Richardean Chimera, MD   Patient coming from: Home  Chief Complaint: Right flank pain  HPI: [name] [name] is a 67 y.o. male with medical history significant for type 2 diabetes, GERD, and dyslipidemia who presented to the ED for evaluation of right flank pain that began at 1400 yesterday.  He had some associated nausea and vomiting and rated the pain 10/10 in onset.  He denied any hematuria or other concerns of fevers or chills.   ED Course: Vital signs stable and leukocytosis 13,000 noted.  Creatinine currently elevated to 2.38 and sodium 134.  He is noted to have a right 2 mm distal UVJ stone and atrophic left kidney.  Initially creatinine levels were normal, however patient had difficulty urinating and creatinine levels have doubled since presentation.  He is also noted to have UTI and was started on Rocephin.  Urology plans for operative intervention this afternoon.  Review of Systems: Reviewed as noted above, otherwise negative.  Past Medical History:  Diagnosis Date   Arthritis    Cholelithiasis    Coronary atherosclerosis of native coronary artery    a. s/p DES to RCA in 2005 b. 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   Type 2 diabetes mellitus (HCC)    Urolithiasis     Past Surgical History:  Procedure Laterality Date   Colonoscopy     COLONOSCOPY     CORONARY STENT INTERVENTION N/A 04/16/2022   Procedure: CORONARY STENT INTERVENTION;  Surgeon: Swaziland, Peter M, MD;  Location: [facility] INVASIVE CV LAB;  Service: Cardiovascular;  Laterality: N/A;   CORONARY STENT PLACEMENT  2005   LEFT HEART CATH AND CORONARY ANGIOGRAPHY N/A 04/16/2022   Procedure: LEFT  HEART CATH AND CORONARY ANGIOGRAPHY;  Surgeon: Swaziland, Peter M, MD;  Location: [facility] INVASIVE CV LAB;  Service: Cardiovascular;  Laterality: N/A;   Testicular tumor resection     Right side     reports that he quit smoking about 13 years ago. His smoking use included cigarettes. He started smoking about 56 years ago. He has a 64.5 pack-year smoking history. He has never used smokeless tobacco. He reports current alcohol use. He reports that he does not use drugs.  No Known Allergies  Family History  Problem Relation Age of Onset   Heart disease Father    Diabetes Father    Heart attack Father    Colon cancer Neg Hx    Esophageal cancer Neg Hx    Rectal cancer Neg Hx    Stomach cancer Neg Hx    Colon polyps Neg Hx     Prior to Admission medications   Medication Sig Start Date End Date Taking? Authorizing Provider  albuterol (VENTOLIN HFA) 108 (90 Base) MCG/ACT inhaler SMARTSIG:2 Puff(s) Via Inhaler 4 Times Daily PRN 09/30/22   [provider]  aspirin EC 81 MG tablet Take 1 tablet (81 mg total) by mouth daily. 05/03/19   Jonelle Sidle, MD  Blood Glucose Monitoring Suppl (ACCU-CHEK GUIDE ME) w/Device KIT Use to monitor glucose twice daily. 10/13/22   Dani Gobble, NP  Blood Glucose Monitoring Suppl (ONETOUCH VERIO) w/Device KIT Use to test  BG qid. E11.65 10/13/22   Dani Gobble, NP  fish oil-omega-3 fatty acids 1000 MG capsule Take 1 g by mouth 2 (two) times daily.    [provider]  glipiZIDE (GLUCOTROL XL) 5 MG 24 hr tablet Take 1 tablet (5 mg total) by mouth daily with breakfast. 04/20/23   Dani Gobble, NP  glucose blood (ONETOUCH VERIO) test strip 1 each by Other route 2 (two) times daily. 10/14/22   Dani Gobble, NP  Lancets MISC 1 each by Does not apply route in the morning and at bedtime. 10/14/22   Dani Gobble, NP  metFORMIN (GLUCOPHAGE-XR) 500 MG 24 hr tablet TAKE 1 TABLET(500 MG) BY MOUTH TWICE DAILY WITH A MEAL 04/27/23   Reardon,  Freddi Starr, NP  nitroGLYCERIN (NITROSTAT) 0.4 MG SL tablet DISSOLVE 1 TABLET UNDER THE TONGUE EVERY 5 MINUTES FOR 3 DOSES AS NEEDED. MAY REPEAT EVERY 5 MINUTES FOR UP TO 3 DOSES. 10/13/22   Jonelle Sidle, MD  NON FORMULARY Omega XL twice daily    [provider]  OZEMPIC, 2 MG/DOSE, 8 MG/3ML SOPN INJECT 2MG  UNDER THE SKIN ONCE A WEEK 05/24/23   Dani Gobble, NP  pantoprazole (PROTONIX) 40 MG tablet Take 1 tablet (40 mg total) by mouth daily. 04/16/22 05/09/24  Arty Baumgartner, NP  propranolol (INDERAL) 20 MG tablet Take 1 tablet (20 mg total) by mouth 2 (two) times daily as needed. 06/17/23   Levert Feinstein, MD  simvastatin (ZOCOR) 40 MG tablet Take 1 tablet (40 mg total) by mouth every evening. 10/13/22   Jonelle Sidle, MD    Physical Exam: Vitals:   06/22/23 0530 06/22/23 0815 06/22/23 0818 06/22/23 0830  BP: (!) 145/77 135/63  131/70  Pulse: 85 84  79  Resp: 16 17    Temp:  98 F (36.7 C) 98 F (36.7 C)   TempSrc:  Oral Oral   SpO2: 98% 95%  98%  Weight:      Height:        Constitutional: NAD, calm, comfortable Vitals:   06/22/23 0530 06/22/23 0815 06/22/23 0818 06/22/23 0830  BP: (!) 145/77 135/63  131/70  Pulse: 85 84  79  Resp: 16 17    Temp:  98 F (36.7 C) 98 F (36.7 C)   TempSrc:  Oral Oral   SpO2: 98% 95%  98%  Weight:      Height:       Eyes: lids and conjunctivae normal Neck: normal, supple Respiratory: clear to auscultation bilaterally. Normal respiratory effort. No accessory muscle use.  Cardiovascular: Regular rate and rhythm, no murmurs. Abdomen: no tenderness, no distention. Bowel sounds positive.  Musculoskeletal:  No edema. Skin: no rashes, lesions, ulcers.  Psychiatric: Flat affect  Labs on Admission: I have personally reviewed following labs and imaging studies  CBC: Recent Labs  Lab 06/21/23 1929  WBC 13.0*  HGB 15.9  HCT 45.9  MCV 85.8  PLT 168   Basic Metabolic Panel: Recent Labs  Lab 06/21/23 1929 06/22/23 0402   NA 137 134*  K 4.9 4.2  CL 102 102  CO2 27 23  GLUCOSE 158* 136*  BUN 23 28*  CREATININE 1.16 2.38*  CALCIUM 9.4 8.4*   GFR: Estimated Creatinine Clearance: 37 mL/min (A) (by C-G formula based on SCr of 2.38 mg/dL (H)). Liver Function Tests: No results for input(s): "AST", "ALT", "ALKPHOS", "BILITOT", "PROT", "ALBUMIN" in the last 168 hours. No results for input(s): "LIPASE", "AMYLASE" in the  last 168 hours. No results for input(s): "AMMONIA" in the last 168 hours. Coagulation Profile: No results for input(s): "INR", "PROTIME" in the last 168 hours. Cardiac Enzymes: No results for input(s): "CKTOTAL", "CKMB", "CKMBINDEX", "TROPONINI" in the last 168 hours. BNP (last 3 results) No results for input(s): "PROBNP" in the last 8760 hours. HbA1C: No results for input(s): "HGBA1C" in the last 72 hours. CBG: No results for input(s): "GLUCAP" in the last 168 hours. Lipid Profile: No results for input(s): "CHOL", "HDL", "LDLCALC", "TRIG", "CHOLHDL", "LDLDIRECT" in the last 72 hours. Thyroid Function Tests: No results for input(s): "TSH", "T4TOTAL", "FREET4", "T3FREE", "THYROIDAB" in the last 72 hours. Anemia Panel: No results for input(s): "VITAMINB12", "FOLATE", "FERRITIN", "TIBC", "IRON", "RETICCTPCT" in the last 72 hours. Urine analysis:    Component Value Date/Time   COLORURINE BROWN (A) 06/22/2023 0346   APPEARANCEUR TURBID (A) 06/22/2023 0346   LABSPEC 1.020 06/22/2023 0346   PHURINE 6.5 06/22/2023 0346   GLUCOSEU 100 (A) 06/22/2023 0346   GLUCOSEU >=1000 (A) 04/01/2018 1516   HGBUR LARGE (A) 06/22/2023 0346   BILIRUBINUR MODERATE (A) 06/22/2023 0346   KETONESUR TRACE (A) 06/22/2023 0346   PROTEINUR 100 (A) 06/22/2023 0346   UROBILINOGEN 0.2 04/01/2018 1516   NITRITE POSITIVE (A) 06/22/2023 0346   LEUKOCYTESUR SMALL (A) 06/22/2023 0346    Radiological Exams on Admission: CT RENAL STONE STUDY  Result Date: 06/22/2023 CLINICAL DATA:  Right flank pain and vomiting.  EXAM: CT ABDOMEN AND PELVIS WITHOUT CONTRAST TECHNIQUE: Multidetector CT imaging of the abdomen and pelvis was performed following the standard protocol without IV contrast. RADIATION DOSE REDUCTION: This exam was performed according to the departmental dose-optimization program which includes automated exposure control, adjustment of the mA and/or kV according to patient size and/or use of iterative reconstruction technique. COMPARISON:  March 24, 2023 and Feb 26, 2015 FINDINGS: Lower chest: Stable, benign 2 mm and 3 mm noncalcified lung nodules are seen along the periphery of the left lung base. Hepatobiliary: No focal liver abnormality is seen. No gallstones, gallbladder wall thickening, or biliary dilatation. Pancreas: Unremarkable. No pancreatic ductal dilatation or surrounding inflammatory changes. Spleen: Normal in size without focal abnormality. Adrenals/Urinary Tract: Adrenal glands are unremarkable. The left kidney is atrophic in size. Compensatory hypertrophy of the right kidney is noted. A 3.1 cm cyst is seen along the posterior aspect of the mid left kidney. A 2 mm obstructing calculus is seen within the distal right ureter, with mild right-sided hydronephrosis, hydroureter and perinephric inflammatory fat stranding. There is mild diffuse urinary bladder wall thickening. Stomach/Bowel: Stomach is within normal limits. The appendix is not identified. No evidence of bowel wall thickening, distention, or inflammatory changes. Vascular/Lymphatic: Aortic atherosclerosis with stable 2.7 cm diameter dilatation of the distal abdominal aorta. No enlarged abdominal or pelvic lymph nodes. Reproductive: Prostate is unremarkable. Other: No abdominal wall hernia or abnormality. No abdominopelvic ascites. Musculoskeletal: No acute or significant osseous findings. IMPRESSION: 1. 2 mm obstructing calculus within the distal right ureter. 2. Atrophic left kidney with compensatory hypertrophy of the right kidney. 3. 3.1 cm  left renal cyst. No follow-up imaging is recommended. This recommendation follows ACR consensus guidelines: Management of the Incidental Renal Mass on CT: A White Paper of the ACR Incidental Findings Committee. J Am Coll Radiol [id number]. 4. Aortic atherosclerosis with stable 2.7 cm diameter dilatation of the distal abdominal aorta. Recommend follow-up ultrasound every 5 years. Aortic Atherosclerosis (ICD10-I70.0). Electronically Signed   By: Aram Candela M.D.   On: 06/22/2023 01:47  Assessment/Plan Principal Problem:   UTI (urinary tract infection)    Right-sided nephrolithiasis/UTI -Continue n.p.o. -IV fluid -Rocephin -Anticipate going to [place] this afternoon per urology  Type 2 diabetes -SSI -Currently n.p.o.  Dyslipidemia -Continue statin  GERD -PPI  Obesity -BMI 31.99   DVT prophylaxis: SCDs Code Status: Full Family Communication:None at bedside  Disposition Plan:Admit for nephrolithiasis/UTI Consults called:Urology Admission status: Obs, MedSurg  Severity of Illness: The appropriate patient status for this patient is OBSERVATION. Observation status is judged to be reasonable and necessary in order to provide the required intensity of service to ensure the patient's safety. The patient's presenting symptoms, physical exam findings, and initial radiographic and laboratory data in the context of their medical condition is felt to place them at decreased risk for further clinical deterioration. Furthermore, it is anticipated that the patient will be medically stable for discharge from the hospital within 2 midnights of admission.    Marlynn Hinckley D Sherryll Burger DO [facility]  If 7PM-7AM, please contact night-coverage [email or web address]  06/22/2023, 9:13 AM

## 2023-06-22 NOTE — Anesthesia Procedure Notes (Signed)
Procedure Name: LMA Insertion Date/Time: 06/22/2023 3:24 PM  Performed by: Lorin Glass, CRNAPre-anesthesia Checklist: Emergency Drugs available, Patient identified, Suction available and Patient being monitored Patient Re-evaluated:Patient Re-evaluated prior to induction Oxygen Delivery Method: Circle system utilized Preoxygenation: Pre-oxygenation with 100% oxygen Induction Type: IV induction LMA: LMA inserted LMA Size: 4.0 Number of attempts: 1 Placement Confirmation: positive ETCO2 and breath sounds checked- equal and bilateral Tube secured with: Tape Dental Injury: Teeth and Oropharynx as per pre-operative assessment  Comments: Nodule noted on tip patients of tongue prior to insertion of LMA

## 2023-06-22 NOTE — Anesthesia Preprocedure Evaluation (Signed)
Anesthesia Evaluation  Patient identified by MRN, date of birth, ID band Patient awake    Reviewed: Allergy & Precautions, H&P , NPO status , Patient's Chart, lab work & pertinent test results, reviewed documented beta blocker date and time   Airway Mallampati: II  TM Distance: >3 FB Neck ROM: full    Dental no notable dental hx.    Pulmonary neg pulmonary ROS, former smoker   Pulmonary exam normal breath sounds clear to auscultation       Cardiovascular Exercise Tolerance: Good hypertension, + angina  + CAD, + Cardiac Stents and + DOE   Rhythm:regular Rate:Normal     Neuro/Psych  Neuromuscular disease CVA negative neurological ROS  negative psych ROS   GI/Hepatic negative GI ROS, Neg liver ROS,GERD  ,,  Endo/Other  negative endocrine ROSdiabetes, Type 2    Renal/GU negative Renal ROS  negative genitourinary   Musculoskeletal   Abdominal   Peds  Hematology negative hematology ROS (+)   Anesthesia Other Findings   Reproductive/Obstetrics negative OB ROS                             Anesthesia Physical Anesthesia Plan  ASA: 3  Anesthesia Plan: General and General LMA   Post-op Pain Management:    Induction:   PONV Risk Score and Plan: Ondansetron  Airway Management Planned:   Additional Equipment:   Intra-op Plan:   Post-operative Plan:   Informed Consent: I have reviewed the patients History and Physical, chart, labs and discussed the procedure including the risks, benefits and alternatives for the proposed anesthesia with the patient or authorized representative who has indicated his/her understanding and acceptance.     Dental Advisory Given  Plan Discussed with: CRNA  Anesthesia Plan Comments:        Anesthesia Quick Evaluation

## 2023-06-22 NOTE — ED Notes (Signed)
Post-void bladder scan results 35mL. MD notified

## 2023-06-22 NOTE — ED Provider Notes (Signed)
[place] EMERGENCY DEPARTMENT AT [facility] Provider Note   CSN: [id number] Arrival date & time: 06/21/23  1829     History  Chief Complaint  Patient presents with   Flank Pain    [name] [name] is a 67 y.o. male.  Presents to the emergency department for evaluation of right flank pain.  Patient reports that the pain began suddenly around 2 PM today.  It was 10 out of 10 at onset.  Patient has had associated nausea and vomiting.       Home Medications Prior to Admission medications   Medication Sig Start Date End Date Taking? Authorizing Provider  albuterol (VENTOLIN HFA) 108 (90 Base) MCG/ACT inhaler SMARTSIG:2 Puff(s) Via Inhaler 4 Times Daily PRN 09/30/22   [provider]  aspirin EC 81 MG tablet Take 1 tablet (81 mg total) by mouth daily. 05/03/19   Jonelle Sidle, MD  Blood Glucose Monitoring Suppl (ACCU-CHEK GUIDE ME) w/Device KIT Use to monitor glucose twice daily. 10/13/22   Dani Gobble, NP  Blood Glucose Monitoring Suppl (ONETOUCH VERIO) w/Device KIT Use to test BG qid. E11.65 10/13/22   Dani Gobble, NP  fish oil-omega-3 fatty acids 1000 MG capsule Take 1 g by mouth 2 (two) times daily.    [provider]  glipiZIDE (GLUCOTROL XL) 5 MG 24 hr tablet Take 1 tablet (5 mg total) by mouth daily with breakfast. 04/20/23   Dani Gobble, NP  glucose blood (ONETOUCH VERIO) test strip 1 each by Other route 2 (two) times daily. 10/14/22   Dani Gobble, NP  Lancets MISC 1 each by Does not apply route in the morning and at bedtime. 10/14/22   Dani Gobble, NP  metFORMIN (GLUCOPHAGE-XR) 500 MG 24 hr tablet TAKE 1 TABLET(500 MG) BY MOUTH TWICE DAILY WITH A MEAL 04/27/23   Reardon, Freddi Starr, NP  nitroGLYCERIN (NITROSTAT) 0.4 MG SL tablet DISSOLVE 1 TABLET UNDER THE TONGUE EVERY 5 MINUTES FOR 3 DOSES AS NEEDED. MAY REPEAT EVERY 5 MINUTES FOR UP TO 3 DOSES. 10/13/22   Jonelle Sidle, MD  NON FORMULARY Omega XL twice daily     [provider]  OZEMPIC, 2 MG/DOSE, 8 MG/3ML SOPN INJECT 2MG  UNDER THE SKIN ONCE A WEEK 05/24/23   Dani Gobble, NP  pantoprazole (PROTONIX) 40 MG tablet Take 1 tablet (40 mg total) by mouth daily. 04/16/22 05/09/24  Arty Baumgartner, NP  propranolol (INDERAL) 20 MG tablet Take 1 tablet (20 mg total) by mouth 2 (two) times daily as needed. 06/17/23   Levert Feinstein, MD  simvastatin (ZOCOR) 40 MG tablet Take 1 tablet (40 mg total) by mouth every evening. 10/13/22   Jonelle Sidle, MD      Allergies    Patient has no known allergies.    Review of Systems   Review of Systems  Physical Exam Updated Vital Signs BP (!) 159/73 (BP Location: Left Arm)   Pulse 66   Temp 98.1 F (36.7 C) (Oral)   Resp 15   Ht 6' (1.829 m)   Wt 107 kg   SpO2 98%   BMI 31.99 kg/m  Physical Exam Vitals and nursing note reviewed.  Constitutional:      General: He is not in acute distress.    Appearance: He is well-developed.  HENT:     Head: Normocephalic and atraumatic.     Mouth/Throat:     Mouth: Mucous membranes are moist.  Eyes:  General: Vision grossly intact. Gaze aligned appropriately.     Extraocular Movements: Extraocular movements intact.     Conjunctiva/sclera: Conjunctivae normal.  Cardiovascular:     Rate and Rhythm: Normal rate and regular rhythm.     Pulses: Normal pulses.     Heart sounds: Normal heart sounds, S1 normal and S2 normal. No murmur heard.    No friction rub. No gallop.  Pulmonary:     Effort: Pulmonary effort is normal. No respiratory distress.     Breath sounds: Normal breath sounds.  Abdominal:     Palpations: Abdomen is soft.     Tenderness: There is no abdominal tenderness. There is no guarding or rebound.     Hernia: No hernia is present.  Musculoskeletal:        General: No swelling.     Cervical back: Full passive range of motion without pain, normal range of motion and neck supple. No pain with movement, spinous process tenderness or muscular  tenderness. Normal range of motion.     Right lower leg: No edema.     Left lower leg: No edema.  Skin:    General: Skin is warm and dry.     Capillary Refill: Capillary refill takes less than 2 seconds.     Findings: No ecchymosis, erythema, lesion or wound.  Neurological:     Mental Status: He is alert and oriented to person, place, and time.     GCS: GCS eye subscore is 4. GCS verbal subscore is 5. GCS motor subscore is 6.     Cranial Nerves: Cranial nerves 2-12 are intact.     Sensory: Sensation is intact.     Motor: Motor function is intact. No weakness or abnormal muscle tone.     Coordination: Coordination is intact.  Psychiatric:        Mood and Affect: Mood normal.        Speech: Speech normal.        Behavior: Behavior normal.     ED Results / Procedures / Treatments   Labs (all labs ordered are listed, but only abnormal results are displayed) Labs Reviewed  URINALYSIS, ROUTINE W REFLEX MICROSCOPIC - Abnormal; Notable for the following components:      Result Value   Color, Urine BROWN (*)    APPearance TURBID (*)    Glucose, UA 100 (*)    Hgb urine dipstick LARGE (*)    Bilirubin Urine MODERATE (*)    Ketones, ur TRACE (*)    Protein, ur 100 (*)    Nitrite POSITIVE (*)    Leukocytes,Ua SMALL (*)    Bacteria, UA RARE (*)    All other components within normal limits  BASIC METABOLIC PANEL - Abnormal; Notable for the following components:   Glucose, Bld 158 (*)    All other components within normal limits  CBC - Abnormal; Notable for the following components:   WBC 13.0 (*)    All other components within normal limits  BASIC METABOLIC PANEL - Abnormal; Notable for the following components:   Sodium 134 (*)    Glucose, Bld 136 (*)    BUN 28 (*)    Creatinine, Ser 2.38 (*)    Calcium 8.4 (*)    GFR, Estimated 29 (*)    All other components within normal limits  URINE CULTURE    EKG None  Radiology CT RENAL STONE STUDY  Result Date:  06/22/2023 CLINICAL DATA:  Right flank pain and vomiting. EXAM: CT ABDOMEN  AND PELVIS WITHOUT CONTRAST TECHNIQUE: Multidetector CT imaging of the abdomen and pelvis was performed following the standard protocol without IV contrast. RADIATION DOSE REDUCTION: This exam was performed according to the departmental dose-optimization program which includes automated exposure control, adjustment of the mA and/or kV according to patient size and/or use of iterative reconstruction technique. COMPARISON:  March 24, 2023 and Feb 26, 2015 FINDINGS: Lower chest: Stable, benign 2 mm and 3 mm noncalcified lung nodules are seen along the periphery of the left lung base. Hepatobiliary: No focal liver abnormality is seen. No gallstones, gallbladder wall thickening, or biliary dilatation. Pancreas: Unremarkable. No pancreatic ductal dilatation or surrounding inflammatory changes. Spleen: Normal in size without focal abnormality. Adrenals/Urinary Tract: Adrenal glands are unremarkable. The left kidney is atrophic in size. Compensatory hypertrophy of the right kidney is noted. A 3.1 cm cyst is seen along the posterior aspect of the mid left kidney. A 2 mm obstructing calculus is seen within the distal right ureter, with mild right-sided hydronephrosis, hydroureter and perinephric inflammatory fat stranding. There is mild diffuse urinary bladder wall thickening. Stomach/Bowel: Stomach is within normal limits. The appendix is not identified. No evidence of bowel wall thickening, distention, or inflammatory changes. Vascular/Lymphatic: Aortic atherosclerosis with stable 2.7 cm diameter dilatation of the distal abdominal aorta. No enlarged abdominal or pelvic lymph nodes. Reproductive: Prostate is unremarkable. Other: No abdominal wall hernia or abnormality. No abdominopelvic ascites. Musculoskeletal: No acute or significant osseous findings. IMPRESSION: 1. 2 mm obstructing calculus within the distal right ureter. 2. Atrophic left kidney  with compensatory hypertrophy of the right kidney. 3. 3.1 cm left renal cyst. No follow-up imaging is recommended. This recommendation follows ACR consensus guidelines: Management of the Incidental Renal Mass on CT: A White Paper of the ACR Incidental Findings Committee. J Am Coll Radiol [phone]. 4. Aortic atherosclerosis with stable 2.7 cm diameter dilatation of the distal abdominal aorta. Recommend follow-up ultrasound every 5 years. Aortic Atherosclerosis (ICD10-I70.0). Electronically Signed   By: Aram Candela M.D.   On: 06/22/2023 01:47    Procedures Procedures    Medications Ordered in ED Medications  cefTRIAXone (ROCEPHIN) 2 g in sodium chloride 0.9 % 100 mL IVPB (2 g Intravenous New Bag/Given 06/22/23 0450)  sodium chloride 0.9 % bolus 1,000 mL (0 mLs Intravenous Stopped 06/22/23 0345)    ED Course/ Medical Decision Making/ A&P                                 Medical Decision Making Amount and/or Complexity of Data Reviewed Labs: ordered. Radiology: ordered.   Differential Diagnosis considered includes, but not limited to: Renal colic/kidney stone; pyelonephritis; aortic dissection; musculoskeletal pain.  Patient presents to the emergency department for evaluation of sudden onset of right flank pain.  Pain was initially 10 out of 10, has improved.  Patient reports a history of kidney stones.  Basic labs were performed through triage and were normal.  Patient with mild but persistent right flank pain.  CT scan performed.  Patient does have a 2 mm distal UVJ stone.  Additionally, however, patient has noted to have an atrophic left kidney.  Findings discussed with Dr. Ronne Binning, on-call for urology.  Patient's creatinine is normal.  Dr. Ronne Binning feels that the patient would be okay for discharge as long he is making urine.    Patient was in the emergency department for 5 hours and has not urinated.  He was given a liter  of fluid and was able to give several milliliters of  dark urine.  Bladder scan afterwards revealed minimal urine.  Blood work was repeated.  Creatinine now up to 2.38 which is doubled from his creatinine at presentation.  Urine that was collected is positive for nitrite, esterase, white cells as well as red cells.  Will cover for infection with Rocephin.  Discussed again with Dr. Ronne Binning.  Request hospitalist admission and he will follow.        Final Clinical Impression(s) / ED Diagnoses Final diagnoses:  Ureterolithiasis    Rx / DC Orders ED Discharge Orders     None         Aleigha Gilani, Canary Brim, MD 06/22/23 [phone]

## 2023-06-22 NOTE — ED Notes (Signed)
This RN inquired about pain level and he said "I am good". Asked if he felt like he needed any pain medicine before we sent him up, pt states "I dont feel like I need any right now"

## 2023-06-22 NOTE — Transfer of Care (Signed)
Immediate Anesthesia Transfer of Care Note  Patient: [name] [name]  Procedure(s) Performed: CYSTOSCOPY WITH RETROGRADE PYELOGRAM/URETERAL STENT PLACEMENT,POSSIBLE STONE EXTRACTION (Right)  Patient Location: PACU  Anesthesia Type:General  Level of Consciousness: awake  Airway & Oxygen Therapy: Patient Spontanous Breathing and Patient connected to nasal cannula oxygen  Post-op Assessment: Report given to RN and Post -op Vital signs reviewed and stable  Post vital signs: Reviewed and stable  Last Vitals:  Vitals Value Taken Time  BP 112/65   Temp 97.9   Pulse 78   Resp 10   SpO2 100%     Last Pain:  Vitals:   06/22/23 1408  TempSrc:   PainSc: 4          Complications: No notable events documented.

## 2023-06-22 NOTE — Consult Note (Signed)
Urology Consult  Referring physician: Dr. Sherryll Burger Reason for referral: right ureteral calculus  Chief Complaint: Right flank pain  History of Present Illness: [name] [name] is a 67yo with a history of nephrolithiasis who presented to the Er last night with worsening right flank pain and an inability to urinate. This is his 4th stone event. He has had mild right flank pain for the past week which became sharp last night. He had associated chills but no fevers. No nausea or vomiting. CT obtained in the ER shows a 2mm right distal ureteral calculus with mild right hydronephrosis. Patient has an atropic left kidney. Creatinine worsened from 1.1 to 2.6 while int he ER. He has not urinated since yesterday morning.  Past Medical History:  Diagnosis Date   Arthritis    Cholelithiasis    Coronary atherosclerosis of native coronary artery    a. s/p DES to RCA in 2005 b. 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   Type 2 diabetes mellitus (HCC)    Urolithiasis    Past Surgical History:  Procedure Laterality Date   Colonoscopy     COLONOSCOPY     CORONARY STENT INTERVENTION N/A 04/16/2022   Procedure: CORONARY STENT INTERVENTION;  Surgeon: Swaziland, Peter M, MD;  Location: [facility] INVASIVE CV LAB;  Service: Cardiovascular;  Laterality: N/A;   CORONARY STENT PLACEMENT  2005   LEFT HEART CATH AND CORONARY ANGIOGRAPHY N/A 04/16/2022   Procedure: LEFT HEART CATH AND CORONARY ANGIOGRAPHY;  Surgeon: Swaziland, Peter M, MD;  Location: [facility] INVASIVE CV LAB;  Service: Cardiovascular;  Laterality: N/A;   Testicular tumor resection     Right side    Medications: I have reviewed the patient's current medications. Allergies: No Known Allergies  Family History  Problem Relation Age of Onset    Heart disease Father    Diabetes Father    Heart attack Father    Colon cancer Neg Hx    Esophageal cancer Neg Hx    Rectal cancer Neg Hx    Stomach cancer Neg Hx    Colon polyps Neg Hx    Social History:  reports that he quit smoking about 13 years ago. His smoking use included cigarettes. He started smoking about 56 years ago. He has a 64.5 pack-year smoking history. He has never used smokeless tobacco. He reports current alcohol use. He reports that he does not use drugs.  Review of Systems  Constitutional:  Positive for chills.  Genitourinary:  Positive for decreased urine volume, difficulty urinating and flank pain.  All other systems reviewed and are negative.   Physical Exam:  Vital signs in last 24 hours: Temp:  [97.5 F (36.4 C)-98.3 F (36.8 C)] 98.3 F (36.8 C) (09/24 1359) Pulse Rate:  [62-87] 69 (09/24 1359) Resp:  [12-18] 12 (09/24 1359) BP: (113-189)/(59-142) 131/65 (09/24 1359) SpO2:  [93 %-100 %] 100 % (09/24 1359) Weight:  [161 kg] 107 kg (09/24 1408) Physical Exam Vitals reviewed.  Constitutional:      Appearance: Normal appearance.  HENT:     Head: Normocephalic and atraumatic.     Nose: Nose normal. No congestion.  Eyes:     Extraocular Movements: Extraocular movements intact.     Pupils: Pupils are equal, round, and  reactive to light.  Cardiovascular:     Rate and Rhythm: Normal rate and regular rhythm.  Pulmonary:     Effort: Pulmonary effort is normal. No respiratory distress.  Abdominal:     General: Abdomen is flat. There is no distension.  Musculoskeletal:        General: No swelling. Normal range of motion.     Cervical back: Normal range of motion and neck supple.  Skin:    General: Skin is warm and dry.  Neurological:     General: No focal deficit present.     Mental Status: He is alert and oriented to person, place, and time.  Psychiatric:        Mood and Affect: Mood normal.        Behavior: Behavior normal.        Thought  Content: Thought content normal.        Judgment: Judgment normal.     Laboratory Data:  Results for orders placed or performed during the hospital encounter of 06/21/23 (from the past 72 hour(s))  Basic metabolic panel     Status: Abnormal   Collection Time: 06/21/23  7:29 PM  Result Value Ref Range   Sodium 137 135 - 145 mmol/L   Potassium 4.9 3.5 - 5.1 mmol/L   Chloride 102 98 - 111 mmol/L   CO2 27 22 - 32 mmol/L   Glucose, Bld 158 (H) 70 - 99 mg/dL    Comment: Glucose reference range applies only to samples taken after fasting for at least 8 hours.   BUN 23 8 - 23 mg/dL   Creatinine, Ser 8.29 0.61 - 1.24 mg/dL   Calcium 9.4 8.9 - 56.2 mg/dL   GFR, Estimated >13 >08 mL/min    Comment: (NOTE) Calculated using the CKD-EPI Creatinine Equation (2021)    Anion gap 8 5 - 15    Comment: Performed at [facility], [address], [place], [place] [address]  CBC     Status: Abnormal   Collection Time: 06/21/23  7:29 PM  Result Value Ref Range   WBC 13.0 (H) 4.0 - 10.5 K/uL   RBC 5.35 4.22 - 5.81 MIL/uL   Hemoglobin 15.9 13.0 - 17.0 g/dL   HCT 69.6 29.5 - 28.4 %   MCV 85.8 80.0 - 100.0 fL   MCH 29.7 26.0 - 34.0 pg   MCHC 34.6 30.0 - 36.0 g/dL   RDW 13.2 44.0 - 10.2 %   Platelets 168 150 - 400 K/uL   nRBC 0.0 0.0 - 0.2 %    Comment: Performed at [facility], [address] [place], [place] [address]  Urinalysis, Routine w reflex microscopic -Urine, Clean Catch     Status: Abnormal   Collection Time: 06/22/23  3:46 AM  Result Value Ref Range   Color, Urine BROWN (A) YELLOW   APPearance TURBID (A) CLEAR   Specific Gravity, Urine 1.020 1.005 - 1.030   pH 6.5 5.0 - 8.0   Glucose, UA 100 (A) NEGATIVE mg/dL   Hgb urine dipstick LARGE (A) NEGATIVE   Bilirubin Urine MODERATE (A) NEGATIVE   Ketones, ur TRACE (A) NEGATIVE mg/dL   Protein, ur 644 (A) NEGATIVE mg/dL   Nitrite POSITIVE (A) NEGATIVE   Leukocytes,Ua SMALL (A) NEGATIVE   RBC / HPF >50 0 - 5 RBC/hpf   WBC, UA 21-50 0 - 5  WBC/hpf   Bacteria, UA RARE (A) NONE SEEN   Squamous Epithelial / HPF 0-5 0 - 5 /HPF  Mucus PRESENT     Comment: Performed at [facility], [address] [place], [place] [address]  Basic metabolic panel     Status: Abnormal   Collection Time: 06/22/23  4:02 AM  Result Value Ref Range   Sodium 134 (L) 135 - 145 mmol/L   Potassium 4.2 3.5 - 5.1 mmol/L   Chloride 102 98 - 111 mmol/L   CO2 23 22 - 32 mmol/L   Glucose, Bld 136 (H) 70 - 99 mg/dL    Comment: Glucose reference range applies only to samples taken after fasting for at least 8 hours.   BUN 28 (H) 8 - 23 mg/dL   Creatinine, Ser 1.91 (H) 0.61 - 1.24 mg/dL    Comment: DELTA CHECK NOTED   Calcium 8.4 (L) 8.9 - 10.3 mg/dL   GFR, Estimated 29 (L) >60 mL/min    Comment: (NOTE) Calculated using the CKD-EPI Creatinine Equation (2021)    Anion gap 9 5 - 15    Comment: Performed at [facility], [address] [place], [place] [address]  Glucose, capillary     Status: Abnormal   Collection Time: 06/22/23 11:48 AM  Result Value Ref Range   Glucose-Capillary 128 (H) 70 - 99 mg/dL    Comment: Glucose reference range applies only to samples taken after fasting for at least 8 hours.   Comment 1 Notify RN    Comment 2 Document in Chart   Glucose, capillary     Status: Abnormal   Collection Time: 06/22/23  2:01 PM  Result Value Ref Range   Glucose-Capillary 114 (H) 70 - 99 mg/dL    Comment: Glucose reference range applies only to samples taken after fasting for at least 8 hours.   No results found for this or any previous visit (from the past 240 hour(s)). Creatinine: Recent Labs    06/21/23 1929 06/22/23 0402  CREATININE 1.16 2.38*   Baseline Creatinine: 1.1  Impression/Assessment:  67yo with right ureteral stone, acute renal failure  Plan:  -We discussed the management of kidney stones. These options include observation, ureteroscopy, shockwave lithotripsy (ESWL) and percutaneous nephrolithotomy (PCNL). We discussed which  options are relevant to the patient's stone(s). We discussed the natural history of kidney stones as well as the complications of untreated stones and the impact on quality of life without treatment as well as with each of the above listed treatments. We also discussed the efficacy of each treatment in its ability to clear the stone burden. With any of these management options I discussed the signs and symptoms of infection and the need for emergent treatment should these be experienced. For each option we discussed the ability of each procedure to clear the patient of their stone burden.   For observation I described the risks which include but are not limited to silent renal damage, life-threatening infection, need for emergent surgery, failure to pass stone and pain.   For ureteroscopy I described the risks which include bleeding, infection, damage to contiguous structures, positioning injury, ureteral stricture, ureteral avulsion, ureteral injury, need for prolonged ureteral stent, inability to perform ureteroscopy, need for an interval procedure, inability to clear stone burden, stent discomfort/pain, heart attack, stroke, pulmonary embolus and the inherent risks with general anesthesia.   For shockwave lithotripsy I described the risks which include arrhythmia, kidney contusion, kidney hemorrhage, need for transfusion, pain, inability to adequately break up stone, inability to pass stone fragments, Steinstrasse, infection associated with obstructing stones, need for alternate surgical procedure, need for repeat  shockwave lithotripsy, MI, CVA, PE and the inherent risks with anesthesia/conscious sedation.   For PCNL I described the risks including positioning injury, pneumothorax, hydrothorax, need for chest tube, inability to clear stone burden, renal laceration, arterial venous fistula or malformation, need for embolization of kidney, loss of kidney or renal function, need for repeat procedure, need  for prolonged nephrostomy tube, ureteral avulsion, MI, CVA, PE and the inherent risks of general anesthesia.   - The patient would like to proceed with right ureteral stent placement  Wilkie Aye 06/22/2023, 2:24 PM

## 2023-06-22 NOTE — ED Notes (Signed)
Pt informed of NPO status due to procedure. Pt verbalizes understanding that he cannot have anything PO at this time. Pt states he has not had anything to eat or drink today

## 2023-06-23 DIAGNOSIS — N390 Urinary tract infection, site not specified: Secondary | ICD-10-CM | POA: Diagnosis not present

## 2023-06-23 LAB — CBC
HCT: 40.2 % (ref 39.0–52.0)
Hemoglobin: 13.6 g/dL (ref 13.0–17.0)
MCH: 29.2 pg (ref 26.0–34.0)
MCHC: 33.8 g/dL (ref 30.0–36.0)
MCV: 86.3 fL (ref 80.0–100.0)
Platelets: 110 10*3/uL — ABNORMAL LOW (ref 150–400)
RBC: 4.66 MIL/uL (ref 4.22–5.81)
RDW: 13.4 % (ref 11.5–15.5)
WBC: 7.2 10*3/uL (ref 4.0–10.5)
nRBC: 0 % (ref 0.0–0.2)

## 2023-06-23 LAB — BASIC METABOLIC PANEL
Anion gap: 11 (ref 5–15)
BUN: 26 mg/dL — ABNORMAL HIGH (ref 8–23)
CO2: 24 mmol/L (ref 22–32)
Calcium: 8.3 mg/dL — ABNORMAL LOW (ref 8.9–10.3)
Chloride: 101 mmol/L (ref 98–111)
Creatinine, Ser: 1.88 mg/dL — ABNORMAL HIGH (ref 0.61–1.24)
GFR, Estimated: 38 mL/min — ABNORMAL LOW (ref >60)
Glucose, Bld: 160 mg/dL — ABNORMAL HIGH (ref 70–99)
Potassium: 3.8 mmol/L (ref 3.5–5.1)
Sodium: 136 mmol/L (ref 135–145)

## 2023-06-23 LAB — MAGNESIUM: Magnesium: 1.6 mg/dL — ABNORMAL LOW (ref 1.7–2.4)

## 2023-06-23 LAB — URINE CULTURE

## 2023-06-23 LAB — HIV ANTIBODY (ROUTINE TESTING W REFLEX): HIV Screen 4th Generation wRfx: NONREACTIVE

## 2023-06-23 LAB — GLUCOSE, CAPILLARY
Glucose-Capillary: 165 mg/dL — ABNORMAL HIGH (ref 70–99)
Glucose-Capillary: 164 mg/dL — ABNORMAL HIGH (ref 70–99)

## 2023-06-23 MED ORDER — CEPHALEXIN 500 MG PO CAPS: 500.0000 mg | ORAL_CAPSULE | Freq: Four times a day (QID) | ORAL | 0 refills | Status: AC

## 2023-06-23 NOTE — Anesthesia Postprocedure Evaluation (Signed)
Anesthesia Post Note  Patient: [name] [name]  Procedure(s) Performed: CYSTOSCOPY WITH RETROGRADE PYELOGRAM/URETERAL STENT PLACEMENT (Right) STONE EXTRACTION WITH BASKET (Right: Ureter)  Patient location during evaluation: Phase II Anesthesia Type: General Level of consciousness: awake Pain management: pain level controlled Vital Signs Assessment: post-procedure vital signs reviewed and stable Respiratory status: spontaneous breathing and respiratory function stable Cardiovascular status: blood pressure returned to baseline and stable Postop Assessment: no headache and no apparent nausea or vomiting Anesthetic complications: no Comments: Late entry   No notable events documented.   Last Vitals:  Vitals:   06/22/23 2340 06/23/23 0303  BP: (!) 121/56 133/66  Pulse: 82 92  Resp: 18 18  Temp: 37.6 C 37.2 C  SpO2: 94% 94%    Last Pain:  Vitals:   06/23/23 0303  TempSrc: Oral  PainSc:                  Windell Norfolk

## 2023-06-23 NOTE — Plan of Care (Signed)

## 2023-06-23 NOTE — Discharge Summary (Signed)
Physician Discharge Summary  [name] [name] NGE:[id number] DOB: 06/11/1956 DOA: 06/21/2023  PCP: Richardean Chimera, MD  Admit date: 06/21/2023  Discharge date: 06/23/2023  Admitted From:Home  Disposition:  Home  Recommendations for Outpatient Follow-up:  Follow up with PCP in 1-2 weeks Follow-up with urology Dr. Ronne Binning with follow-up arranged in 1 week for stent removal Remain on Keflex for 1 week as prescribed Continue on other home medications as prior  Home Health: None  Equipment/Devices: None  Discharge Condition:Stable  CODE STATUS: Full  Diet recommendation: Heart Healthy/carb modified  Brief/Interim Summary: [name] [name] is a 67 y.o. male with medical history significant for type 2 diabetes, GERD, and dyslipidemia who presented to the ED for evaluation of right flank pain that began at 1400 yesterday.  He was admitted for AKI secondary to right ureteral stone as well as associated UTI and underwent JJ stent placement along with stone manipulation and basket extraction on 9/24.  He is currently in stable condition for discharge from urology standpoint on Keflex for 7 days and will follow-up with urology in 1 week for stent removal.  No other acute events or concerns noted.  Discharge Diagnoses:  Principal Problem:   UTI (urinary tract infection) Active Problems:   Ureterolithiasis  Principal discharge diagnosis: AKI secondary to right ureteral stone as well as associated UTI.  Discharge Instructions  Discharge Instructions     Diet - low sodium heart healthy   Complete by: As directed    Increase activity slowly   Complete by: As directed       Allergies as of 06/23/2023   No Known Allergies      Medication List     TAKE these medications    aspirin EC 81 MG tablet Take 1 tablet (81 mg total) by mouth daily.   cephALEXin 500 MG capsule Commonly known as: KEFLEX Take 1 capsule (500 mg total) by mouth 4 (four) times daily for 7 days.   fish  oil-omega-3 fatty acids 1000 MG capsule Take 1 g by mouth 2 (two) times daily.   glipiZIDE 5 MG 24 hr tablet Commonly known as: GLUCOTROL XL Take 1 tablet (5 mg total) by mouth daily with breakfast.   Lancets Misc 1 each by Does not apply route in the morning and at bedtime.   metFORMIN 500 MG 24 hr tablet Commonly known as: GLUCOPHAGE-XR TAKE 1 TABLET(500 MG) BY MOUTH TWICE DAILY WITH A MEAL   nitroGLYCERIN 0.4 MG SL tablet Commonly known as: NITROSTAT DISSOLVE 1 TABLET UNDER THE TONGUE EVERY 5 MINUTES FOR 3 DOSES AS NEEDED. MAY REPEAT EVERY 5 MINUTES FOR UP TO 3 DOSES.   NON FORMULARY Omega XL twice daily   OneTouch Verio test strip Generic drug: glucose blood 1 each by Other route 2 (two) times daily.   OneTouch Verio w/Device Kit Use to test BG qid. E11.65   Accu-Chek Guide Me w/Device Kit Use to monitor glucose twice daily.   Ozempic (2 MG/DOSE) 8 MG/3ML Sopn Generic drug: Semaglutide (2 MG/DOSE) INJECT 2MG  UNDER THE SKIN ONCE A WEEK What changed: See the new instructions.   pantoprazole 40 MG tablet Commonly known as: Protonix Take 1 tablet (40 mg total) by mouth daily.   propranolol 20 MG tablet Commonly known as: INDERAL Take 1 tablet (20 mg total) by mouth 2 (two) times daily as needed.   simvastatin 40 MG tablet Commonly known as: ZOCOR Take 1 tablet (40 mg total) by mouth every evening.  Follow-up Information     McKenzie, Mardene Celeste, MD. Call in 1 week(s).   Specialty: Urology Contact information: [address]  [place] [address] [phone]         Richardean Chimera, MD. Schedule an appointment as soon as possible for a visit in 2 week(s).   Specialty: Family Medicine Contact information: [address] [place] [address] [phone]                No Known Allergies  Consultations: Urology   Procedures/Studies: DG C-Arm 1-60 Min-No Report  Result Date: 06/22/2023 Fluoroscopy was utilized by the  requesting physician.  No radiographic interpretation.   CT RENAL STONE STUDY  Result Date: 06/22/2023 CLINICAL DATA:  Right flank pain and vomiting. EXAM: CT ABDOMEN AND PELVIS WITHOUT CONTRAST TECHNIQUE: Multidetector CT imaging of the abdomen and pelvis was performed following the standard protocol without IV contrast. RADIATION DOSE REDUCTION: This exam was performed according to the departmental dose-optimization program which includes automated exposure control, adjustment of the mA and/or kV according to patient size and/or use of iterative reconstruction technique. COMPARISON:  March 24, 2023 and Feb 26, 2015 FINDINGS: Lower chest: Stable, benign 2 mm and 3 mm noncalcified lung nodules are seen along the periphery of the left lung base. Hepatobiliary: No focal liver abnormality is seen. No gallstones, gallbladder wall thickening, or biliary dilatation. Pancreas: Unremarkable. No pancreatic ductal dilatation or surrounding inflammatory changes. Spleen: Normal in size without focal abnormality. Adrenals/Urinary Tract: Adrenal glands are unremarkable. The left kidney is atrophic in size. Compensatory hypertrophy of the right kidney is noted. A 3.1 cm cyst is seen along the posterior aspect of the mid left kidney. A 2 mm obstructing calculus is seen within the distal right ureter, with mild right-sided hydronephrosis, hydroureter and perinephric inflammatory fat stranding. There is mild diffuse urinary bladder wall thickening. Stomach/Bowel: Stomach is within normal limits. The appendix is not identified. No evidence of bowel wall thickening, distention, or inflammatory changes. Vascular/Lymphatic: Aortic atherosclerosis with stable 2.7 cm diameter dilatation of the distal abdominal aorta. No enlarged abdominal or pelvic lymph nodes. Reproductive: Prostate is unremarkable. Other: No abdominal wall hernia or abnormality. No abdominopelvic ascites. Musculoskeletal: No acute or significant osseous findings.  IMPRESSION: 1. 2 mm obstructing calculus within the distal right ureter. 2. Atrophic left kidney with compensatory hypertrophy of the right kidney. 3. 3.1 cm left renal cyst. No follow-up imaging is recommended. This recommendation follows ACR consensus guidelines: Management of the Incidental Renal Mass on CT: A White Paper of the ACR Incidental Findings Committee. J Am Coll Radiol [phone]. 4. Aortic atherosclerosis with stable 2.7 cm diameter dilatation of the distal abdominal aorta. Recommend follow-up ultrasound every 5 years. Aortic Atherosclerosis (ICD10-I70.0). Electronically Signed   By: Aram Candela M.D.   On: 06/22/2023 01:47     Discharge Exam: Vitals:   06/22/23 2340 06/23/23 0303  BP: (!) 121/56 133/66  Pulse: 82 92  Resp: 18 18  Temp: 99.7 F (37.6 C) 99 F (37.2 C)  SpO2: 94% 94%   Vitals:   06/22/23 1647 06/22/23 2118 06/22/23 2340 06/23/23 0303  BP: (!) 147/66 (!) 135/56 (!) 121/56 133/66  Pulse: 67 81 82 92  Resp:  19 18 18   Temp: 98.2 F (36.8 C) 99.2 F (37.3 C) 99.7 F (37.6 C) 99 F (37.2 C)  TempSrc: Oral Oral Oral Oral  SpO2: 98% 96% 94% 94%  Weight:      Height:  General: Pt is alert, awake, not in acute distress Cardiovascular: RRR, S1/S2 +, no rubs, no gallops Respiratory: CTA bilaterally, no wheezing, no rhonchi Abdominal: Soft, NT, ND, bowel sounds + Extremities: no edema, no cyanosis    The results of significant diagnostics from this hospitalization (including imaging, microbiology, ancillary and laboratory) are listed below for reference.     Microbiology: No results found for this or any previous visit (from the past 240 hour(s)).   Labs: BNP (last 3 results) No results for input(s): "BNP" in the last 8760 hours. Basic Metabolic Panel: Recent Labs  Lab 06/21/23 1929 06/22/23 0402 06/23/23 0411  NA 137 134* 136  K 4.9 4.2 3.8  CL 102 102 101  CO2 27 23 24   GLUCOSE 158* 136* 160*  BUN 23 28* 26*  CREATININE  1.16 2.38* 1.88*  CALCIUM 9.4 8.4* 8.3*  MG  --   --  1.6*   Liver Function Tests: No results for input(s): "AST", "ALT", "ALKPHOS", "BILITOT", "PROT", "ALBUMIN" in the last 168 hours. No results for input(s): "LIPASE", "AMYLASE" in the last 168 hours. No results for input(s): "AMMONIA" in the last 168 hours. CBC: Recent Labs  Lab 06/21/23 1929 06/23/23 0411  WBC 13.0* 7.2  HGB 15.9 13.6  HCT 45.9 40.2  MCV 85.8 86.3  PLT 168 110*   Cardiac Enzymes: No results for input(s): "CKTOTAL", "CKMB", "CKMBINDEX", "TROPONINI" in the last 168 hours. BNP: Invalid input(s): "POCBNP" CBG: Recent Labs  Lab 06/22/23 1658 06/22/23 2121 06/22/23 2341 06/23/23 0304 06/23/23 0752  GLUCAP 117* 191* 133* 165* 164*   D-Dimer No results for input(s): "DDIMER" in the last 72 hours. Hgb A1c No results for input(s): "HGBA1C" in the last 72 hours. Lipid Profile No results for input(s): "CHOL", "HDL", "LDLCALC", "TRIG", "CHOLHDL", "LDLDIRECT" in the last 72 hours. Thyroid function studies No results for input(s): "TSH", "T4TOTAL", "T3FREE", "THYROIDAB" in the last 72 hours.  Invalid input(s): "FREET3" Anemia work up No results for input(s): "VITAMINB12", "FOLATE", "FERRITIN", "TIBC", "IRON", "RETICCTPCT" in the last 72 hours. Urinalysis    Component Value Date/Time   COLORURINE BROWN (A) 06/22/2023 0346   APPEARANCEUR TURBID (A) 06/22/2023 0346   LABSPEC 1.020 06/22/2023 0346   PHURINE 6.5 06/22/2023 0346   GLUCOSEU 100 (A) 06/22/2023 0346   GLUCOSEU >=1000 (A) 04/01/2018 1516   HGBUR LARGE (A) 06/22/2023 0346   BILIRUBINUR MODERATE (A) 06/22/2023 0346   KETONESUR TRACE (A) 06/22/2023 0346   PROTEINUR 100 (A) 06/22/2023 0346   UROBILINOGEN 0.2 04/01/2018 1516   NITRITE POSITIVE (A) 06/22/2023 0346   LEUKOCYTESUR SMALL (A) 06/22/2023 0346   Sepsis Labs Recent Labs  Lab 06/21/23 1929 06/23/23 0411  WBC 13.0* 7.2   Microbiology No results found for this or any previous visit  (from the past 240 hour(s)).   Time coordinating discharge: 35 minutes  SIGNED:   Erick Blinks, DO [facility] 06/23/2023, 9:15 AM  If 7PM-7AM, please contact night-coverage [email or web address]

## 2023-06-23 NOTE — Progress Notes (Signed)
   06/23/23 0918  TOC Brief Assessment  Insurance and Status Reviewed  Patient has primary care physician Yes  Home environment has been reviewed from home  Prior level of function: independent  Prior/Current Home Services No current home services  Social Determinants of Health Reivew SDOH reviewed no interventions necessary  Readmission risk has been reviewed Yes  Transition of care needs no transition of care needs at this time     Transition of Care Department [facility]) has reviewed patient and no TOC needs have been identified at this time. We will continue to monitor patient advancement through interdisciplinary progression rounds. If new patient transition needs arise, please place a TOC consult.

## 2023-06-29 ENCOUNTER — Encounter (HOSPITAL_COMMUNITY): Payer: Self-pay | Admitting: Urology

## 2023-06-30 LAB — CALCULI, WITH PHOTOGRAPH (CLINICAL LAB)
Calcium Oxalate Dihydrate: 30 %
Calcium Oxalate Monohydrate: 70 %
Weight Calculi: 6 mg

## 2023-07-05 DIAGNOSIS — R269 Unspecified abnormalities of gait and mobility: Secondary | ICD-10-CM | POA: Diagnosis not present

## 2023-07-05 DIAGNOSIS — I2510 Atherosclerotic heart disease of native coronary artery without angina pectoris: Secondary | ICD-10-CM | POA: Diagnosis not present

## 2023-07-05 DIAGNOSIS — E1159 Type 2 diabetes mellitus with other circulatory complications: Secondary | ICD-10-CM | POA: Diagnosis not present

## 2023-07-05 DIAGNOSIS — R251 Tremor, unspecified: Secondary | ICD-10-CM | POA: Diagnosis not present

## 2023-07-06 ENCOUNTER — Ambulatory Visit: Payer: Medicare Other | Attending: Cardiology | Admitting: Cardiology

## 2023-07-06 ENCOUNTER — Encounter: Payer: Self-pay | Admitting: Acute Care

## 2023-07-06 ENCOUNTER — Ambulatory Visit: Payer: Medicare Other | Admitting: Acute Care

## 2023-07-06 ENCOUNTER — Encounter: Payer: Self-pay | Admitting: Cardiology

## 2023-07-06 VITALS — BP 128/70 | HR 74 | Ht 72.0 in | Wt 238.8 lb

## 2023-07-06 DIAGNOSIS — Z122 Encounter for screening for malignant neoplasm of respiratory organs: Secondary | ICD-10-CM | POA: Diagnosis not present

## 2023-07-06 DIAGNOSIS — E782 Mixed hyperlipidemia: Secondary | ICD-10-CM | POA: Diagnosis not present

## 2023-07-06 DIAGNOSIS — I10 Essential (primary) hypertension: Secondary | ICD-10-CM | POA: Diagnosis not present

## 2023-07-06 DIAGNOSIS — Z87891 Personal history of nicotine dependence: Secondary | ICD-10-CM

## 2023-07-06 DIAGNOSIS — I25119 Atherosclerotic heart disease of native coronary artery with unspecified angina pectoris: Secondary | ICD-10-CM

## 2023-07-06 NOTE — Progress Notes (Signed)
Cardiology Office Note  Date: 07/06/2023   ID: OFIR GRIESINGER, DOB [birth year], MRN [id number]  History of Present Illness: [name] [name] is a 67 y.o. male last seen in April.  He is here with his wife for a follow-up visit.  Reports stable NYHA class II dyspnea, no angina or nitroglycerin use in the interim.  ECG today shows normal sinus rhythm.  I went over his cardiac medications which include aspirin, Zocor, and as needed nitroglycerin.  He is also on Ozempic for treatment of type 2 diabetes mellitus.  Physical Exam: VS:  BP 128/70 (BP Location: Right Arm, Patient Position: Sitting, Cuff Size: Normal)   Pulse 74   Ht 6' (1.829 m)   Wt 238 lb 12.8 oz (108.3 kg)   SpO2 97%   BMI 32.39 kg/m , BMI Body mass index is 32.39 kg/m.  Wt Readings from Last 3 Encounters:  07/06/23 238 lb 12.8 oz (108.3 kg)  06/22/23 235 lb 14.3 oz (107 kg)  06/17/23 237 lb (107.5 kg)    General: Patient appears comfortable at rest. HEENT: Conjunctiva and lids normal. Neck: Supple, no elevated JVP or carotid bruits. Lungs: Clear to auscultation, nonlabored breathing at rest. Cardiac: Regular rate and rhythm, no S3 or significant systolic murmur.  ECG:  An ECG dated 05/01/2022 was personally reviewed today and demonstrated:  Sinus rhythm.  Labwork: 03/24/2023: ALT 121; AST 135 06/17/2023: TSH 2.100 06/23/2023: BUN 26; Creatinine, Ser 1.88; Hemoglobin 13.6; Magnesium 1.6; Platelets 110; Potassium 3.8; Sodium 136     Component Value Date/Time   CHOL 151 08/12/2022 1502   TRIG 211 (H) 08/12/2022 1502   TRIG 96 07/09/2006 1049   HDL 55 08/12/2022 1502   CHOLHDL 2.7 08/12/2022 1502   VLDL 42 (H) 08/12/2022 1502   LDLCALC 54 08/12/2022 1502   LDLDIRECT 57.0 04/01/2018 1408   Other Studies Reviewed Today:  No interval cardiac testing for review today.  Assessment and Plan:  1.  CAD status post DES to the RCA in 2005 and DES to the RCA in July 2023.  Symptomatically stable without active angina  or nitroglycerin use.  Continue aspirin and Zocor.   2.  Mixed hyperlipidemia.  LDL 54 in November 2023.  Continue Zocor 40 mg daily.   3.  Primary hypertension.  No change in current regimen.   4.  Type 2 diabetes mellitus now following with [facility].  He is on Ozempic, Glucotrol XL and Glucophage XR.  Hemoglobin A1c 8.0 in July.  Disposition:  Follow up  6 months.  Signed, Jonelle Sidle, M.D., F.A.C.C. [facility]

## 2023-07-06 NOTE — Progress Notes (Signed)
Virtual Visit via Telephone Note  I connected with [name] [name] on 07/06/23 at  1:30 PM EDT by telephone and verified that I am speaking with the correct person using two identifiers.  Location: Patient:  At home Provider:  [address] [place], [place], [address]    I discussed the limitations, risks, security and privacy concerns of performing an evaluation and management service by telephone and the availability of in person appointments. I also discussed with the patient that there may be a patient responsible charge related to this service. The patient expressed understanding and agreed to proceed.    Shared Decision Making Visit Lung Cancer Screening Program [phone])   Eligibility: Age 67 y.o. Pack Years Smoking History Calculation 84 pack year smoking history (# packs/per year x # years smoked) Recent History of coughing up blood  no Unexplained weight loss? no ( >Than 15 pounds within the last 6 months ) Prior History Lung / other cancer no (Diagnosis within the last 5 years already requiring surveillance chest CT Scans). Smoking Status Former Smoker Former Smokers: Years since quit: 13 years  Quit Date:7/25/ 2011  Visit Components: Discussion included one or more decision making aids. yes Discussion included risk/benefits of screening. yes Discussion included potential follow up diagnostic testing for abnormal scans. yes Discussion included meaning and risk of over diagnosis. yes Discussion included meaning and risk of False Positives. yes Discussion included meaning of total radiation exposure. yes  Counseling Included: Importance of adherence to annual lung cancer LDCT screening. yes Impact of comorbidities on ability to participate in the program. yes Ability and willingness to under diagnostic treatment. yes  Smoking Cessation Counseling: Current Smokers:  Discussed importance of smoking cessation. yes Information about tobacco cessation classes and  interventions provided to patient. yes Patient provided with "ticket" for LDCT Scan. yes Symptomatic Patient. no  Counseling NA Diagnosis Code: Tobacco Use Z72.0 Asymptomatic Patient yes  Counseling (Intermediate counseling: > three minutes counseling) L2440 Former Smokers:  Discussed the importance of maintaining cigarette abstinence. yes Diagnosis Code: Personal History of Nicotine Dependence. N02.725 Information about tobacco cessation classes and interventions provided to patient. Yes Patient provided with "ticket" for LDCT Scan. yes Written Order for Lung Cancer Screening with LDCT placed in Epic. Yes (CT Chest Lung Cancer Screening Low Dose W/O CM) [id number] Z12.2-Screening of respiratory organs Z87.891-Personal history of nicotine dependence  I spent 25 minutes of face to face time/virtual visit time  with  [name] [name] discussing the risks and benefits of lung cancer screening. We took the time to pause the power point at intervals to allow for questions to be asked and answered to ensure understanding. We discussed that he had taken the single most powerful action possible to decrease his risk of developing lung cancer when he quit smoking. I counseled him to remain smoke free, and to contact me if he ever had the desire to smoke again so that I can provide resources and tools to help support the effort to remain smoke free. We discussed the time and location of the scan, and that either  Abigail Miyamoto RN, Karlton Lemon, RN or I  or I will call / send a letter with the results within  24-72 hours of receiving them. He has the office contact information in the event he needs to speak with me,  he verbalized understanding of all of the above and had no further questions upon leaving the office.     I explained to the patient that  there has been a high incidence of coronary artery disease noted on these exams. I explained that this is a non-gated exam therefore degree or severity cannot be  determined. This patient is on statin therapy. I have asked the patient to follow-up with their PCP regarding any incidental finding of coronary artery disease and management with diet or medication as they feel is clinically indicated. The patient verbalized understanding of the above and had no further questions.     Bevelyn Ngo, NP 07/06/2023

## 2023-07-06 NOTE — Patient Instructions (Signed)

## 2023-07-06 NOTE — Patient Instructions (Signed)
Medication Instructions:  Your physician recommends that you continue on your current medications as directed. Please refer to the Current Medication list given to you today.  *If you need a refill on your cardiac medications before your next appointment, please call your pharmacy*   Lab Work: NONE   If you have labs (blood work) drawn today and your tests are completely normal, you will receive your results only by: MyChart Message (if you have MyChart) OR A paper copy in the mail If you have any lab test that is abnormal or we need to change your treatment, we will call you to review the results.   Testing/Procedures: NONE    Follow-Up: At [facility], you and your health needs are our priority.  As part of our continuing mission to provide you with exceptional heart care, we have created designated Provider Care Teams.  These Care Teams include your primary Cardiologist (physician) and Advanced Practice Providers (APPs -  Physician Assistants and Nurse Practitioners) who all work together to provide you with the care you need, when you need it.  We recommend signing up for the patient portal called "MyChart".  Sign up information is provided on this After Visit Summary.  MyChart is used to connect with patients for Virtual Visits (Telemedicine).  Patients are able to view lab/test results, encounter notes, upcoming appointments, etc.  Non-urgent messages can be sent to your provider as well.   To learn more about what you can do with MyChart, go to [email or web address].    Your next appointment:   6 month(s)  Provider:   You may see Samuel McDowell, MD or one of the following Advanced Practice Providers on your designated Care Team:   Brittany Strader, PA-C  Michele Lenze, PA-C     Other Instructions Thank you for choosing  [facility]!    

## 2023-07-07 ENCOUNTER — Ambulatory Visit (INDEPENDENT_AMBULATORY_CARE_PROVIDER_SITE_OTHER): Payer: Medicare Other | Admitting: Urology

## 2023-07-07 ENCOUNTER — Other Ambulatory Visit: Payer: Self-pay

## 2023-07-07 ENCOUNTER — Ambulatory Visit (HOSPITAL_COMMUNITY)
Admission: RE | Admit: 2023-07-07 | Discharge: 2023-07-07 | Disposition: A | Discharge status: Home / Self Care | Payer: Medicare Other | Source: Ambulatory Visit | Attending: Acute Care | Admitting: Acute Care

## 2023-07-07 VITALS — BP 110/73 | HR 86

## 2023-07-07 DIAGNOSIS — Z122 Encounter for screening for malignant neoplasm of respiratory organs: Secondary | ICD-10-CM | POA: Insufficient documentation

## 2023-07-07 DIAGNOSIS — N200 Calculus of kidney: Secondary | ICD-10-CM

## 2023-07-07 DIAGNOSIS — Z87891 Personal history of nicotine dependence: Secondary | ICD-10-CM | POA: Insufficient documentation

## 2023-07-07 DIAGNOSIS — Z466 Encounter for fitting and adjustment of urinary device: Secondary | ICD-10-CM | POA: Diagnosis not present

## 2023-07-07 MED ORDER — SULFAMETHOXAZOLE-TRIMETHOPRIM 800-160 MG PO TABS
1.0000 | ORAL_TABLET | Freq: Once | ORAL | 0 refills | Status: AC
Start: 2023-07-07 — End: 2023-07-07

## 2023-07-07 MED ORDER — CIPROFLOXACIN HCL 500 MG PO TABS
500.0000 mg | ORAL_TABLET | Freq: Once | ORAL | Status: DC
Start: 2023-07-07 — End: 2023-07-07

## 2023-07-07 NOTE — Patient Instructions (Signed)

## 2023-07-07 NOTE — Progress Notes (Signed)
   07/07/23  CC: followup nephrolithiasis   HPI: Mr Robling is a 67yo here for ureteral stent removal Blood pressure 110/73, pulse 86. NED. A&Ox3.   No respiratory distress   Abd soft, NT, ND Normal phallus with bilateral descended testicles  Cystoscopy Procedure Note  Patient identification was confirmed, informed consent was obtained, and patient was prepped using Betadine solution.  Lidocaine jelly was administered per urethral meatus.     Pre-Procedure: - Inspection reveals a normal caliber ureteral meatus.  Procedure: The flexible cystoscope was introduced without difficulty - No urethral strictures/lesions are present. - Enlarged prostate  - Normal bladder neck - Bilateral ureteral orifices identified - Bladder mucosa  reveals no ulcers, tumors, or lesions - No bladder stones - No trabeculation  Using a grasper the right ureteral stent was removed intact.  Post-Procedure: - Patient tolerated the procedure well  Assessment/ Plan: Followup 6 weeks with renal US  No follow-ups on file.  Wilkie Aye, MD

## 2023-07-13 ENCOUNTER — Encounter: Payer: Self-pay | Admitting: Urology

## 2023-07-13 ENCOUNTER — Other Ambulatory Visit: Payer: Self-pay | Admitting: Cardiology

## 2023-07-21 DIAGNOSIS — E119 Type 2 diabetes mellitus without complications: Secondary | ICD-10-CM | POA: Diagnosis not present

## 2023-07-22 ENCOUNTER — Other Ambulatory Visit: Payer: Self-pay | Admitting: Acute Care

## 2023-07-22 DIAGNOSIS — Z122 Encounter for screening for malignant neoplasm of respiratory organs: Secondary | ICD-10-CM

## 2023-07-22 DIAGNOSIS — Z87891 Personal history of nicotine dependence: Secondary | ICD-10-CM

## 2023-07-22 LAB — COMPREHENSIVE METABOLIC PANEL
ALT: 49 [IU]/L — ABNORMAL HIGH (ref 0–44)
AST: 39 [IU]/L (ref 0–40)
Albumin: 4.2 g/dL (ref 3.9–4.9)
Alkaline Phosphatase: 72 [IU]/L (ref 44–121)
BUN/Creatinine Ratio: 14 (ref 10–24)
BUN: 15 mg/dL (ref 8–27)
Bilirubin Total: 0.6 mg/dL (ref 0.0–1.2)
CO2: 22 mmol/L (ref 20–29)
Calcium: 9.8 mg/dL (ref 8.6–10.2)
Chloride: 101 mmol/L (ref 96–106)
Creatinine, Ser: 1.05 mg/dL (ref 0.76–1.27)
Globulin, Total: 2.4 g/dL (ref 1.5–4.5)
Glucose: 217 mg/dL — ABNORMAL HIGH (ref 70–99)
Potassium: 4.8 mmol/L (ref 3.5–5.2)
Sodium: 140 mmol/L (ref 134–144)
Total Protein: 6.6 g/dL (ref 6.0–8.5)
eGFR: 77 mL/min/{1.73_m2} (ref >59)

## 2023-07-27 ENCOUNTER — Ambulatory Visit: Payer: Medicare Other | Admitting: Nurse Practitioner

## 2023-07-27 ENCOUNTER — Encounter: Payer: Self-pay | Admitting: Nurse Practitioner

## 2023-07-27 VITALS — BP 144/75 | HR 81 | Ht 72.0 in | Wt 240.6 lb

## 2023-07-27 DIAGNOSIS — Z7984 Long term (current) use of oral hypoglycemic drugs: Secondary | ICD-10-CM | POA: Diagnosis not present

## 2023-07-27 DIAGNOSIS — E1151 Type 2 diabetes mellitus with diabetic peripheral angiopathy without gangrene: Secondary | ICD-10-CM | POA: Diagnosis not present

## 2023-07-27 DIAGNOSIS — Z7985 Long-term (current) use of injectable non-insulin antidiabetic drugs: Secondary | ICD-10-CM | POA: Diagnosis not present

## 2023-07-27 DIAGNOSIS — E119 Type 2 diabetes mellitus without complications: Secondary | ICD-10-CM

## 2023-07-27 LAB — POCT GLYCOSYLATED HEMOGLOBIN (HGB A1C): Hemoglobin A1C: 7.8 % — AB (ref 4.0–5.6)

## 2023-07-27 MED ORDER — OZEMPIC (2 MG/DOSE) 8 MG/3ML ~~LOC~~ SOPN: 2.0000 mg | PEN_INJECTOR | SUBCUTANEOUS | 3 refills | Status: DC

## 2023-07-27 MED ORDER — GLIPIZIDE ER 5 MG PO TB24: 5.0000 mg | ORAL_TABLET | Freq: Every day | ORAL | 3 refills | Status: DC

## 2023-07-27 MED ORDER — METFORMIN HCL ER 500 MG PO TB24: 500.0000 mg | ORAL_TABLET | Freq: Two times a day (BID) | ORAL | 3 refills | Status: DC

## 2023-07-27 MED ORDER — ONETOUCH VERIO VI STRP: 1.0000 | ORAL_STRIP | Freq: Two times a day (BID) | 3 refills | Status: DC

## 2023-07-27 MED ORDER — PANTOPRAZOLE SODIUM 40 MG PO TBEC: 40.0000 mg | DELAYED_RELEASE_TABLET | Freq: Every day | ORAL | 1 refills | Status: DC

## 2023-07-27 NOTE — Progress Notes (Signed)
Endocrinology Follow Up Note       07/27/2023, 11:38 AM   Subjective:    Patient ID: [name] [name], male    DOB: 07-Apr-1956.  [name] [name] is being seen in follow up after being seen in consultation for management of currently uncontrolled symptomatic diabetes requested by  Richardean Chimera, MD.   Past Medical History:  Diagnosis Date   Arthritis    Cholelithiasis    Coronary atherosclerosis of native coronary artery    a. s/p DES to RCA in 2005 b. 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   Type 2 diabetes mellitus (HCC)    Urolithiasis     Past Surgical History:  Procedure Laterality Date   Colonoscopy     COLONOSCOPY     CORONARY STENT INTERVENTION N/A 04/16/2022   Procedure: CORONARY STENT INTERVENTION;  Surgeon: Swaziland, Peter M, MD;  Location: [facility] INVASIVE CV LAB;  Service: Cardiovascular;  Laterality: N/A;   CORONARY STENT PLACEMENT  2005   CYSTOSCOPY W/ URETERAL STENT PLACEMENT Right 06/22/2023   Procedure: CYSTOSCOPY WITH RETROGRADE PYELOGRAM/URETERAL STENT PLACEMENT;  Surgeon: Malen Gauze, MD;  Location: [facility];  Service: Urology;  Laterality: Right;   LEFT HEART CATH AND CORONARY ANGIOGRAPHY N/A 04/16/2022   Procedure: LEFT HEART CATH AND CORONARY ANGIOGRAPHY;  Surgeon: Swaziland, Peter M, MD;  Location: [facility] INVASIVE CV LAB;  Service: Cardiovascular;  Laterality: N/A;   STONE EXTRACTION WITH BASKET Right 06/22/2023   Procedure: STONE EXTRACTION WITH BASKET;  Surgeon: Malen Gauze, MD;  Location: [facility];  Service: Urology;  Laterality: Right;   Testicular tumor resection     Right side    Social History   Socioeconomic History   Marital status:  Married    Spouse name: [name]   Number of children: 0   Years of education: Not on file   Highest education level: Not on file  Occupational History   Occupation: Truck driver  Tobacco Use   Smoking status: Former    Current packs/day: 0.00    Average packs/day: 2.0 packs/day for 43.0 years (86.0 ttl pk-yrs)    Types: Cigarettes    Start date: 04/22/1967    Quit date: 04/21/2010    Years since quitting: 13.2   Smokeless tobacco: Never  Vaping Use   Vaping status: Never Used  Substance and Sexual Activity   Alcohol use: Yes    Comment: occasionally   Drug use: No   Sexual activity: Not on file  Other Topics Concern   Not on file  Social History Narrative   Not on file   Social Determinants of Health   Financial Resource Strain: Not on file  Food Insecurity: No Food Insecurity (06/22/2023)   Hunger Vital Sign    Worried About Running Out of Food in the Last Year: Never true    Ran Out of Food in the Last Year: Never true  Transportation Needs: No  Transportation Needs (06/22/2023)   PRAPARE - Administrator, Civil Service (Medical): No    Lack of Transportation (Non-Medical): No  Physical Activity: Not on file  Stress: Not on file  Social Connections: Not on file    Family History  Problem Relation Age of Onset   Heart disease Father    Diabetes Father    Heart attack Father    Colon cancer Neg Hx    Esophageal cancer Neg Hx    Rectal cancer Neg Hx    Stomach cancer Neg Hx    Colon polyps Neg Hx     Outpatient Encounter Medications as of 07/27/2023  Medication Sig   aspirin EC 81 MG tablet Take 1 tablet (81 mg total) by mouth daily.   Blood Glucose Monitoring Suppl (ACCU-CHEK GUIDE ME) w/Device KIT Use to monitor glucose twice daily.   Blood Glucose Monitoring Suppl (ONETOUCH VERIO) w/Device KIT Use to test BG qid. E11.65   fish oil-omega-3 fatty acids 1000 MG capsule Take 1 g by mouth 2 (two) times daily.   Lancets MISC 1 each by Does not apply route  in the morning and at bedtime.   nitroGLYCERIN (NITROSTAT) 0.4 MG SL tablet DISSOLVE 1 TABLET UNDER THE TONGUE EVERY 5 MINUTES FOR 3 DOSES AS NEEDED. MAY REPEAT EVERY 5 MINUTES FOR UP TO 3 DOSES.   NON FORMULARY Omega XL twice daily   propranolol (INDERAL) 20 MG tablet Take 1 tablet (20 mg total) by mouth 2 (two) times daily as needed.   simvastatin (ZOCOR) 40 MG tablet TAKE 1 TABLET(40 MG) BY MOUTH EVERY EVENING   [DISCONTINUED] glipiZIDE (GLUCOTROL XL) 5 MG 24 hr tablet Take 1 tablet (5 mg total) by mouth daily with breakfast.   [DISCONTINUED] glucose blood (ONETOUCH VERIO) test strip 1 each by Other route 2 (two) times daily.   [DISCONTINUED] metFORMIN (GLUCOPHAGE-XR) 500 MG 24 hr tablet TAKE 1 TABLET(500 MG) BY MOUTH TWICE DAILY WITH A MEAL   [DISCONTINUED] OZEMPIC, 2 MG/DOSE, 8 MG/3ML SOPN INJECT 2MG  UNDER THE SKIN ONCE A WEEK (Patient taking differently: Inject 2 mg into the skin once a week.)   [DISCONTINUED] pantoprazole (PROTONIX) 40 MG tablet Take 1 tablet (40 mg total) by mouth daily.   glipiZIDE (GLUCOTROL XL) 5 MG 24 hr tablet Take 1 tablet (5 mg total) by mouth daily with breakfast.   glucose blood (ONETOUCH VERIO) test strip 1 each by Other route 2 (two) times daily.   metFORMIN (GLUCOPHAGE-XR) 500 MG 24 hr tablet Take 1 tablet (500 mg total) by mouth 2 (two) times daily with a meal.   pantoprazole (PROTONIX) 40 MG tablet Take 1 tablet (40 mg total) by mouth daily.   Semaglutide, 2 MG/DOSE, (OZEMPIC, 2 MG/DOSE,) 8 MG/3ML SOPN Inject 2 mg into the skin once a week.   No facility-administered encounter medications on file as of 07/27/2023.    ALLERGIES: No Known Allergies  VACCINATION STATUS: Immunization History  Administered Date(s) Administered   Pneumococcal Polysaccharide-23 07/15/2009   Tdap 03/05/2017   Zoster, Live 02/10/2016    Diabetes He presents for his follow-up diabetic visit. He has type 2 diabetes mellitus. Onset time: diagnosed at approx age of 67. His  disease course has been improving. There are no hypoglycemic associated symptoms. There are no hypoglycemic complications. Diabetic complications include a CVA and heart disease (CAD). Risk factors for coronary artery disease include diabetes mellitus, dyslipidemia, family history, obesity, male sex, hypertension and sedentary lifestyle. Current diabetic treatment includes oral agent (  monotherapy) and diet (Ozempic). He is compliant with treatment most of the time. His weight is fluctuating minimally. He is following a generally healthy diet. When asked about meal planning, he reported none. He has not had a previous visit with a dietitian. He rarely participates in exercise. His home blood glucose trend is decreasing steadily. His breakfast blood glucose range is generally 140-180 mg/dl. (He presents today, accompanied by his wife, with his logs showing slightly above target fasting glycemic profile.  His POCT A1c today is 7.8%, improving from last visit of 8%.  He continues to work on diet and lifestyle.  He was sick, seen in the ED for kidney stone and associated UTI requiring surgical removal.  He denies any hypoglycemia.) An ACE inhibitor/angiotensin II receptor blocker is not being taken. He sees a podiatrist.Eye exam is current.    Review of systems  Constitutional: + Minimally fluctuating body weight,  current Body mass index is 32.63 kg/m. , no fatigue, no subjective hyperthermia, no subjective hypothermia Eyes: no blurry vision, no xerophthalmia ENT: no sore throat, no nodules palpated in throat, no dysphagia/odynophagia, no hoarseness Cardiovascular: no chest pain, no shortness of breath, no palpitations, no leg swelling Respiratory: no cough, no shortness of breath Gastrointestinal: no nausea/vomiting/diarrhea Musculoskeletal: no muscle/joint aches Skin: no rashes, no hyperemia Neurological: no tremors, no numbness, no tingling, no dizziness Psychiatric: no depression, no  anxiety  Objective:     BP (!) 144/75 (BP Location: Right Arm, Patient Position: Sitting, Cuff Size: Large)   Pulse 81   Ht 6' (1.829 m)   Wt 240 lb 9.6 oz (109.1 kg)   BMI 32.63 kg/m   Wt Readings from Last 3 Encounters:  07/27/23 240 lb 9.6 oz (109.1 kg)  07/06/23 238 lb 12.8 oz (108.3 kg)  06/22/23 235 lb 14.3 oz (107 kg)     BP Readings from Last 3 Encounters:  07/27/23 (!) 144/75  07/07/23 110/73  07/06/23 128/70      Physical Exam- Limited  Constitutional:  Body mass index is 32.63 kg/m. , not in acute distress, normal state of mind Eyes:  EOMI, no exophthalmos Musculoskeletal: no gross deformities, strength intact in all four extremities, no gross restriction of joint movements Skin:  no rashes, no hyperemia Neurological: no tremor with outstretched hands   Diabetic Foot Exam - Simple   No data filed     CMP ( most recent) CMP     Component Value Date/Time   NA 140 07/21/2023 1354   K 4.8 07/21/2023 1354   CL 101 07/21/2023 1354   CO2 22 07/21/2023 1354   GLUCOSE 217 (H) 07/21/2023 1354   GLUCOSE 160 (H) 06/23/2023 0411   GLUCOSE 107 (H) 07/09/2006 1049   BUN 15 07/21/2023 1354   CREATININE 1.05 07/21/2023 1354   CALCIUM 9.8 07/21/2023 1354   PROT 6.6 07/21/2023 1354   ALBUMIN 4.2 07/21/2023 1354   AST 39 07/21/2023 1354   ALT 49 (H) 07/21/2023 1354   ALKPHOS 72 07/21/2023 1354   BILITOT 0.6 07/21/2023 1354   GFRNONAA 38 (L) 06/23/2023 0411   GFRAA >60 10/26/2015 1936     Diabetic Labs (most recent): Lab Results  Component Value Date   HGBA1C 7.8 (A) 07/27/2023   HGBA1C 8.0 (A) 04/20/2023   HGBA1C 8.8 (A) 01/05/2023   MICROALBUR 30 mg/L 04/20/2023   MICROALBUR <0.7 04/01/2018   MICROALBUR <0.7 03/05/2017     Lipid Panel ( most recent) Lipid Panel     Component Value Date/Time  CHOL 151 08/12/2022 1502   TRIG 211 (H) 08/12/2022 1502   TRIG 96 07/09/2006 1049   HDL 55 08/12/2022 1502   CHOLHDL 2.7 08/12/2022 1502   VLDL 42 (H)  08/12/2022 1502   LDLCALC 54 08/12/2022 1502   LDLDIRECT 57.0 04/01/2018 1408      Lab Results  Component Value Date   TSH 2.100 06/17/2023   TSH 1.18 07/18/2021   TSH 1.27 01/26/2020   TSH 2.34 04/01/2018   TSH 2.14 03/05/2017   TSH 1.27 02/10/2016   TSH 1.35 11/02/2014   TSH 1.64 05/19/2013   TSH 1.21 12/16/2011   TSH 1.27 08/01/2010   FREET4 0.70 07/18/2021           Assessment & Plan:   1) Type 2 Diabetes mellitus without complication without long-term current use of insulin  He presents today, accompanied by his wife, with his logs showing slightly above target fasting glycemic profile.  His POCT A1c today is 7.8%, improving from last visit of 8%.  He continues to work on diet and lifestyle.  He was sick, seen in the ED for kidney stone and associated UTI requiring surgical removal.  He denies any hypoglycemia.  [name] [name] has currently uncontrolled symptomatic type 2 DM since 67 years of age.   -Recent labs reviewed.  His LFTs were significantly elevated during recent check at the ED.  Will repeat CMP prior to next visit.  - I had a long discussion with him about the progressive nature of diabetes and the pathology behind its complications. -his diabetes is complicated by CAD with stents, CVA and he remains at a high risk for more acute and chronic complications which include CAD, CVA, CKD, retinopathy, and neuropathy. These are all discussed in detail with him.  The following Lifestyle Medicine recommendations according to American College of Lifestyle Medicine [facility]) were discussed and offered to patient and he agrees to start the journey:  A. Whole Foods, Plant-based plate comprising of fruits and vegetables, plant-based proteins, whole-grain carbohydrates was discussed in detail with the patient.   A list for source of those nutrients were also provided to the patient.  Patient will use only water or unsweetened tea for hydration. B.  The need to stay away  from risky substances including alcohol, smoking; obtaining 7 to 9 hours of restorative sleep, at least 150 minutes of moderate intensity exercise weekly, the importance of healthy social connections,  and stress reduction techniques were discussed. C.  A full color page of  Calorie density of various food groups per pound showing examples of each food groups was provided to the patient.  - Nutritional counseling repeated at each appointment due to patients tendency to fall back in to old habits.  - The patient admits there is a room for improvement in their diet and drink choices. -  Suggestion is made for the patient to avoid simple carbohydrates from their diet including Cakes, Sweet Desserts / Pastries, Ice Cream, Soda (diet and regular), Sweet Tea, Candies, Chips, Cookies, Sweet Pastries, Store Bought Juices, Alcohol in Excess of 1-2 drinks a day, Artificial Sweeteners, Coffee Creamer, and "Sugar-free" Products. This will help patient to have stable blood glucose profile and potentially avoid unintended weight gain.   - I encouraged the patient to switch to unprocessed or minimally processed complex starch and increased protein intake (animal or plant source), fruits, and vegetables.   - Patient is advised to stick to a routine mealtimes to eat 3 meals a  day and avoid unnecessary snacks (to snack only to correct hypoglycemia).  - I have approached him with the following individualized plan to manage his diabetes and patient agrees:   -He is advised to continue Metformin 500 md XR twice daily with meal, Ozempic 2 mg SQ weekly, and Glipizide 5 mg XL daily with breakfast.  Will avoid escalating treatment at this time and allow more time to recover fully since lithotripsy.  -he is encouraged to continue monitoring glucose once daily (2 days of the week check glucose twice daily), before breakfast and to call the clinic if he has readings less than 70 or above 200 for 3 tests in a row.  -  Adjustment parameters are given to him for hypo and hyperglycemia in writing.  - his Actos, Parlodel, and Jardiance was previously discontinued, risk outweighs benefit for this patient.  He does not have significant CKD or CHF.  - Specific targets for  A1c; LDL, HDL, and Triglycerides were discussed with the patient.  2) Blood Pressure /Hypertension:  his blood pressure is controlled to target without the use of antihypertensive medications.     3) Lipids/Hyperlipidemia:    Review of his recent lipid panel from 08/12/22 showed controlled LDL at 54 and elevated triglycerides of 211.  he is advised to continue Simvastatin 40 mg daily at bedtime.  Side effects and precautions discussed with him.  He has annual labs coming up with PCP.  I asked they bring copy of results to the office so we can enter into the system.  4)  Weight/Diet:  his Body mass index is 32.63 kg/m.  -  clearly complicating his diabetes care.   he is a candidate for weight loss. I discussed with him the fact that loss of 5 - 10% of his  current body weight will have the most impact on his diabetes management.  Exercise, and detailed carbohydrates information provided  -  detailed on discharge instructions.  5) Chronic Care/Health Maintenance: -he is not on ACEI/ARB and is on Statin medications and is encouraged to initiate and continue to follow up with Ophthalmology, Dentist, Podiatrist at least yearly or according to recommendations, and advised to stay away from smoking. I have recommended yearly flu vaccine and pneumonia vaccine at least every 5 years; moderate intensity exercise for up to 150 minutes weekly; and sleep for at least 7 hours a day.  - he is advised to maintain close follow up with Richardean Chimera, MD for primary care needs, as well as his other providers for optimal and coordinated care.      I spent  30  minutes in the care of the patient today including review of labs from CMP, Lipids, Thyroid Function,  Hematology (current and previous including abstractions from other facilities); face-to-face time discussing  his blood glucose readings/logs, discussing hypoglycemia and hyperglycemia episodes and symptoms, medications doses, his options of short and long term treatment based on the latest standards of care / guidelines;  discussion about incorporating lifestyle medicine;  and documenting the encounter. Risk reduction counseling performed per USPSTF guidelines to reduce obesity and cardiovascular risk factors.     Please refer to Patient Instructions for Blood Glucose Monitoring and Insulin/Medications Dosing Guide"  in media tab for additional information. Please  also refer to " Patient Self Inventory" in the Media  tab for reviewed elements of pertinent patient history.  [name] [name] participated in the discussions, expressed understanding, and voiced agreement with the above plans.  All  questions were answered to his satisfaction. he is encouraged to contact clinic should he have any questions or concerns prior to his return visit.     Follow up plan: - Return in about 3 months (around 10/27/2023) for Diabetes F/U- A1c and UM in office, No previsit labs, Bring meter and logs.   Ronny Bacon, [facility] [address] [place], [place] [address] Phone: [phone] Fax: [phone]  07/27/2023, 11:38 AM

## 2023-07-28 DIAGNOSIS — Z0001 Encounter for general adult medical examination with abnormal findings: Secondary | ICD-10-CM | POA: Diagnosis not present

## 2023-07-29 DIAGNOSIS — E7849 Other hyperlipidemia: Secondary | ICD-10-CM | POA: Diagnosis not present

## 2023-07-29 DIAGNOSIS — E119 Type 2 diabetes mellitus without complications: Secondary | ICD-10-CM | POA: Diagnosis not present

## 2023-07-29 DIAGNOSIS — E1159 Type 2 diabetes mellitus with other circulatory complications: Secondary | ICD-10-CM | POA: Diagnosis not present

## 2023-07-29 DIAGNOSIS — Z0001 Encounter for general adult medical examination with abnormal findings: Secondary | ICD-10-CM | POA: Diagnosis not present

## 2023-08-10 DIAGNOSIS — R269 Unspecified abnormalities of gait and mobility: Secondary | ICD-10-CM | POA: Diagnosis not present

## 2023-08-10 DIAGNOSIS — Z2911 Encounter for prophylactic immunotherapy for respiratory syncytial virus (RSV): Secondary | ICD-10-CM | POA: Diagnosis not present

## 2023-08-10 DIAGNOSIS — R251 Tremor, unspecified: Secondary | ICD-10-CM | POA: Diagnosis not present

## 2023-08-10 DIAGNOSIS — I2510 Atherosclerotic heart disease of native coronary artery without angina pectoris: Secondary | ICD-10-CM | POA: Diagnosis not present

## 2023-08-10 DIAGNOSIS — E1159 Type 2 diabetes mellitus with other circulatory complications: Secondary | ICD-10-CM | POA: Diagnosis not present

## 2023-08-10 DIAGNOSIS — Z23 Encounter for immunization: Secondary | ICD-10-CM | POA: Diagnosis not present

## 2023-08-16 ENCOUNTER — Ambulatory Visit (HOSPITAL_COMMUNITY)
Admission: RE | Admit: 2023-08-16 | Discharge: 2023-08-16 | Disposition: A | Discharge status: Home / Self Care | Payer: Medicare Other | Source: Ambulatory Visit | Attending: Urology | Admitting: Urology

## 2023-08-16 DIAGNOSIS — N200 Calculus of kidney: Secondary | ICD-10-CM | POA: Diagnosis not present

## 2023-08-16 DIAGNOSIS — N281 Cyst of kidney, acquired: Secondary | ICD-10-CM | POA: Diagnosis not present

## 2023-08-23 ENCOUNTER — Ambulatory Visit: Payer: Medicare Other | Admitting: Urology

## 2023-08-23 ENCOUNTER — Encounter: Payer: Self-pay | Admitting: Urology

## 2023-08-23 VITALS — BP 136/83 | HR 89

## 2023-08-23 DIAGNOSIS — Z87442 Personal history of urinary calculi: Secondary | ICD-10-CM

## 2023-08-23 DIAGNOSIS — Z09 Encounter for follow-up examination after completed treatment for conditions other than malignant neoplasm: Secondary | ICD-10-CM

## 2023-08-23 DIAGNOSIS — N200 Calculus of kidney: Secondary | ICD-10-CM

## 2023-08-23 NOTE — Progress Notes (Unsigned)
08/23/2023 2:38 PM   [name] [name] Jan 28, 1956 [id number]  Referring provider: Richardean Chimera, MD [address] [place],  [place] [address]  No chief complaint on file.   HPI: Renal US shows no right hydronephrosis. IPSS 3 QOL 1 on no BPH therapy. Nocturia 1x.    PMH: Past Medical History:  Diagnosis Date   Arthritis    Cholelithiasis    Coronary atherosclerosis of native coronary artery    a. s/p DES to RCA in 2005 b. 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   Type 2 diabetes mellitus (HCC)    Urolithiasis     Surgical History: Past Surgical History:  Procedure Laterality Date   Colonoscopy     COLONOSCOPY     CORONARY STENT INTERVENTION N/A 04/16/2022   Procedure: CORONARY STENT INTERVENTION;  Surgeon: Swaziland, Peter M, MD;  [facility]: [facility];  Service: Cardiovascular;  Laterality: N/A;   CORONARY STENT PLACEMENT  2005   CYSTOSCOPY W/ URETERAL STENT PLACEMENT Right 06/22/2023   Procedure: CYSTOSCOPY WITH RETROGRADE PYELOGRAM/URETERAL STENT PLACEMENT;  Surgeon: Malen Gauze, MD;  Location: [facility];  Service: Urology;  Laterality: Right;   LEFT HEART CATH AND CORONARY ANGIOGRAPHY N/A 04/16/2022   Procedure: LEFT HEART CATH AND CORONARY ANGIOGRAPHY;  Surgeon: Swaziland, Peter M, MD;  Location: [facility] INVASIVE CV LAB;  Service: Cardiovascular;  Laterality: N/A;   STONE EXTRACTION WITH BASKET Right 06/22/2023   Procedure: STONE EXTRACTION WITH BASKET;  Surgeon: Malen Gauze, MD;  Location: [facility];  Service: Urology;  Laterality: Right;   Testicular tumor resection     Right side    Home Medications:  Allergies as of 08/23/2023   No Known Allergies      Medication List        Accurate as of August 23, 2023  2:38 PM.  If you have any questions, ask your nurse or doctor.          aspirin EC 81 MG tablet Take 1 tablet (81 mg total) by mouth daily.   fish oil-omega-3 fatty acids 1000 MG capsule Take 1 g by mouth 2 (two) times daily.   glipiZIDE 5 MG 24 hr tablet Commonly known as: GLUCOTROL XL Take 1 tablet (5 mg total) by mouth daily with breakfast.   Lancets Misc 1 each by Does not apply route in the morning and at bedtime.   metFORMIN 500 MG 24 hr tablet Commonly known as: GLUCOPHAGE-XR Take 1 tablet (500 mg total) by mouth 2 (two) times daily with a meal.   nitroGLYCERIN 0.4 MG SL tablet Commonly known as: NITROSTAT DISSOLVE 1 TABLET UNDER THE TONGUE EVERY 5 MINUTES FOR 3 DOSES AS NEEDED. MAY REPEAT EVERY 5 MINUTES FOR UP TO 3 DOSES.   NON FORMULARY Omega XL twice daily   OneTouch Verio test strip Generic drug: glucose blood 1 each by Other route 2 (two) times daily.   OneTouch Verio w/Device Kit Use to test BG qid. E11.65   Accu-Chek Guide Me w/Device Kit Use to monitor glucose twice daily.   Ozempic (2 MG/DOSE) 8 MG/3ML Sopn Generic drug: Semaglutide (2 MG/DOSE) Inject 2 mg into the skin once a week.   pantoprazole 40 MG tablet Commonly known as:  Protonix Take 1 tablet (40 mg total) by mouth daily.   propranolol 20 MG tablet Commonly known as: INDERAL Take 1 tablet (20 mg total) by mouth 2 (two) times daily as needed.   simvastatin 40 MG tablet Commonly known as: ZOCOR TAKE 1 TABLET(40 MG) BY MOUTH EVERY EVENING        Allergies: No Known Allergies  Family History: Family History  Problem Relation Age of Onset   Heart disease Father    Diabetes Father    Heart attack Father    Colon cancer Neg Hx    Esophageal cancer Neg Hx    Rectal cancer Neg Hx    Stomach cancer Neg Hx    Colon polyps Neg Hx     Social History:  reports that he quit smoking about 13 years ago. His smoking use included cigarettes. He started smoking about 56 years ago. He has a 86  pack-year smoking history. He has never used smokeless tobacco. He reports current alcohol use. He reports that he does not use drugs.  ROS: All other review of systems were reviewed and are negative except what is noted above in HPI  Physical Exam: BP 136/83   Pulse 89   Constitutional:  Alert and oriented, No acute distress. HEENT: [place] AT, moist mucus membranes.  Trachea midline, no masses. Cardiovascular: No clubbing, cyanosis, or edema. Respiratory: Normal respiratory effort, no increased work of breathing. GI: Abdomen is soft, nontender, nondistended, no abdominal masses GU: No CVA tenderness.  Lymph: No cervical or inguinal lymphadenopathy. Skin: No rashes, bruises or suspicious lesions. Neurologic: Grossly intact, no focal deficits, moving all 4 extremities. Psychiatric: Normal mood and affect.  Laboratory Data: Lab Results  Component Value Date   WBC 7.2 06/23/2023   HGB 13.6 06/23/2023   HCT 40.2 06/23/2023   MCV 86.3 06/23/2023   PLT 110 (L) 06/23/2023    Lab Results  Component Value Date   CREATININE 1.05 07/21/2023    Lab Results  Component Value Date   PSA 0.30 07/18/2021   PSA 0.29 04/01/2018   PSA 0.22 03/05/2017    Lab Results  Component Value Date   TESTOSTERONE 295.05 (L) 12/16/2011    Lab Results  Component Value Date   HGBA1C 7.8 (A) 07/27/2023    Urinalysis    Component Value Date/Time   COLORURINE BROWN (A) 06/22/2023 0346   APPEARANCEUR TURBID (A) 06/22/2023 0346   LABSPEC 1.020 06/22/2023 0346   PHURINE 6.5 06/22/2023 0346   GLUCOSEU 100 (A) 06/22/2023 0346   GLUCOSEU >=1000 (A) 04/01/2018 1516   HGBUR LARGE (A) 06/22/2023 0346   BILIRUBINUR MODERATE (A) 06/22/2023 0346   KETONESUR TRACE (A) 06/22/2023 0346   PROTEINUR 100 (A) 06/22/2023 0346   UROBILINOGEN 0.2 04/01/2018 1516   NITRITE POSITIVE (A) 06/22/2023 0346   LEUKOCYTESUR SMALL (A) 06/22/2023 0346    Lab Results  Component Value Date   MUCUS Presence of 05/19/2013    BACTERIA RARE (A) 06/22/2023    Pertinent Imaging: *** No results found for this or any previous visit.  No results found for this or any previous visit.  No results found for this or any previous visit.  No results found for this or any previous visit.  No results found for this or any previous visit.  No valid procedures specified. No results found for this or any previous visit.  Results for orders placed during the hospital encounter of 06/21/23  CT RENAL STONE STUDY  Narrative CLINICAL DATA:  Right  flank pain and vomiting.  EXAM: CT ABDOMEN AND PELVIS WITHOUT CONTRAST  TECHNIQUE: Multidetector CT imaging of the abdomen and pelvis was performed following the standard protocol without IV contrast.  RADIATION DOSE REDUCTION: This exam was performed according to the departmental dose-optimization program which includes automated exposure control, adjustment of the mA and/or kV according to patient size and/or use of iterative reconstruction technique.  COMPARISON:  March 24, 2023 and Feb 26, 2015  FINDINGS: Lower chest: Stable, benign 2 mm and 3 mm noncalcified lung nodules are seen along the periphery of the left lung base.  Hepatobiliary: No focal liver abnormality is seen. No gallstones, gallbladder wall thickening, or biliary dilatation.  Pancreas: Unremarkable. No pancreatic ductal dilatation or surrounding inflammatory changes.  Spleen: Normal in size without focal abnormality.  Adrenals/Urinary Tract: Adrenal glands are unremarkable. The left kidney is atrophic in size. Compensatory hypertrophy of the right kidney is noted. A 3.1 cm cyst is seen along the posterior aspect of the mid left kidney. A 2 mm obstructing calculus is seen within the distal right ureter, with mild right-sided hydronephrosis, hydroureter and perinephric inflammatory fat stranding. There is mild diffuse urinary bladder wall thickening.  Stomach/Bowel: Stomach is within normal  limits. The appendix is not identified. No evidence of bowel wall thickening, distention, or inflammatory changes.  Vascular/Lymphatic: Aortic atherosclerosis with stable 2.7 cm diameter dilatation of the distal abdominal aorta. No enlarged abdominal or pelvic lymph nodes.  Reproductive: Prostate is unremarkable.  Other: No abdominal wall hernia or abnormality. No abdominopelvic ascites.  Musculoskeletal: No acute or significant osseous findings.  IMPRESSION: 1. 2 mm obstructing calculus within the distal right ureter. 2. Atrophic left kidney with compensatory hypertrophy of the right kidney. 3. 3.1 cm left renal cyst. No follow-up imaging is recommended. This recommendation follows ACR consensus guidelines: Management of the Incidental Renal Mass on CT: A White Paper of the ACR Incidental Findings Committee. J Am Coll Radiol [phone]. 4. Aortic atherosclerosis with stable 2.7 cm diameter dilatation of the distal abdominal aorta. Recommend follow-up ultrasound every 5 years.  Aortic Atherosclerosis (ICD10-I70.0).   Electronically Signed By: Aram Candela M.D. On: 06/22/2023 01:47   Assessment & Plan:    1. Nephrolithiasis Dietary handout given -followup 6 months with renal US - Urinalysis, Routine w reflex microscopic   No follow-ups on file.  Wilkie Aye, MD  [facility] [place]

## 2023-08-23 NOTE — Patient Instructions (Signed)

## 2023-08-27 ENCOUNTER — Other Ambulatory Visit: Payer: Self-pay | Admitting: Nurse Practitioner

## 2023-09-06 ENCOUNTER — Institutional Professional Consult (permissible substitution): Payer: Medicare Other | Admitting: Physician Assistant

## 2023-09-06 VITALS — BP 150/83 | HR 71 | Resp 18 | Ht 72.0 in | Wt 242.0 lb

## 2023-09-06 DIAGNOSIS — I7121 Aneurysm of the ascending aorta, without rupture: Secondary | ICD-10-CM | POA: Diagnosis not present

## 2023-09-06 NOTE — Patient Instructions (Signed)
Risk Modification in those with ascending thoracic aortic aneurysm:  Continue good control of blood pressure (prefer SBP 130/80 or less)  2. Avoid fluoroquinolone antibiotics (I.e Ciprofloxacin, Avelox, Levofloxacin, Ofloxacin)  3.  Use of statin to decrease risk of progression of atherosclerosis  4.  Exercise and activity limitations is individualized, but in general, contact sports are to be avoided and one should avoid heavy lifting (defined as half of ideal body weight) and exercises involving sustained Valsalva maneuver.  5., Follow up in 10 months after the lung screening CT scan.

## 2023-09-06 NOTE — Progress Notes (Signed)
PCP is Richardean Chimera, MD Referring Provider is Richardean Chimera, MD  Reason for consult:  Evaluation and surveillance of thoracic aortic aneurysm   HPI: [name] [name]. [name] [name] is an 67 year old gentleman with a past history notable for type 2 diabetes mellitus, arthritis, hypertension, dyslipidemia, previous stroke, and coronary artery disease status post PTCI to the RCA in 2005 and again in 2023 with drug-eluting stents.  He is a former 86-pack-year smoker having quit in 2011.  He was recently undergoing a lung cancer screening CT when he was discovered to have several sub-centimeter pulmonary nodules none of which were suspicious for malignancy along with a 4.0 cm thoracic aortic aneurysm.  He was referred to [place] for evaluation and ongoing surveillance.  [name] [name] has no known family history of aneurysmal disease.  Other than the 2 PTCI procedures, he has not had any surgery.  He denies having any pain.  [name] [name] is a retired [employer].  Past Medical History:  Diagnosis Date   Arthritis    Cholelithiasis    Coronary atherosclerosis of native coronary artery    a. s/p DES to RCA in 2005 b. 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   Type 2 diabetes mellitus (HCC)    Urolithiasis     Past Surgical History:  Procedure Laterality Date   Colonoscopy     COLONOSCOPY     CORONARY STENT INTERVENTION N/A 04/16/2022   Procedure: CORONARY STENT INTERVENTION;  Surgeon: Swaziland, Peter M, MD;  Location: [facility];  Service: Cardiovascular;  Laterality: N/A;   CORONARY STENT PLACEMENT  2005   CYSTOSCOPY W/ URETERAL STENT PLACEMENT Right 06/22/2023   Procedure: CYSTOSCOPY WITH RETROGRADE PYELOGRAM/URETERAL STENT PLACEMENT;  Surgeon: Malen Gauze, MD;   Location: [facility];  Service: Urology;  Laterality: Right;   LEFT HEART CATH AND CORONARY ANGIOGRAPHY N/A 04/16/2022   Procedure: LEFT HEART CATH AND CORONARY ANGIOGRAPHY;  Surgeon: Swaziland, Peter M, MD;  Location: [facility] INVASIVE CV LAB;  Service: Cardiovascular;  Laterality: N/A;   STONE EXTRACTION WITH BASKET Right 06/22/2023   Procedure: STONE EXTRACTION WITH BASKET;  Surgeon: Malen Gauze, MD;  Location: [facility];  Service: Urology;  Laterality: Right;   Testicular tumor resection     Right side    Family History  Problem Relation Age of Onset   Heart disease Father    Diabetes Father    Heart attack Father    Colon cancer Neg Hx    Esophageal cancer Neg Hx    Rectal cancer Neg Hx    Stomach cancer Neg Hx    Colon polyps Neg Hx     Social History Social History   Tobacco Use   Smoking status: Former    Current packs/day: 0.00    Average packs/day: 2.0 packs/day for 43.0 years (86.0 ttl pk-yrs)    Types: Cigarettes    Start date: 04/22/1967    Quit date: 04/21/2010    Years since quitting: 13.3   Smokeless tobacco: Never  Vaping Use   Vaping status: Never Used  Substance Use Topics   Alcohol use: Yes    Comment: occasionally   Drug use: No    Current Outpatient Medications  Medication Sig Dispense Refill  aspirin EC 81 MG tablet Take 1 tablet (81 mg total) by mouth daily. 90 tablet 3   Blood Glucose Monitoring Suppl (ACCU-CHEK GUIDE) w/Device KIT USE TO MONITOR BLOOD GLUCOSE TWICE DAILY 1 kit 0   Blood Glucose Monitoring Suppl (ONETOUCH VERIO) w/Device KIT Use to test BG qid. E11.65 1 kit 0   fish oil-omega-3 fatty acids 1000 MG capsule Take 1 g by mouth 2 (two) times daily.     glipiZIDE (GLUCOTROL XL) 5 MG 24 hr tablet Take 1 tablet (5 mg total) by mouth daily with breakfast. 90 tablet 3   glucose blood (ONETOUCH VERIO) test strip 1 each by Other route 2 (two) times daily. 100 each 3   Lancets MISC 1 each by Does not apply route in the morning and at bedtime. 100  each 3   metFORMIN (GLUCOPHAGE-XR) 500 MG 24 hr tablet Take 1 tablet (500 mg total) by mouth 2 (two) times daily with a meal. 180 tablet 3   nitroGLYCERIN (NITROSTAT) 0.4 MG SL tablet DISSOLVE 1 TABLET UNDER THE TONGUE EVERY 5 MINUTES FOR 3 DOSES AS NEEDED. MAY REPEAT EVERY 5 MINUTES FOR UP TO 3 DOSES. 25 tablet 3   NON FORMULARY Omega XL twice daily     pantoprazole (PROTONIX) 40 MG tablet Take 1 tablet (40 mg total) by mouth daily. 90 tablet 1   propranolol (INDERAL) 20 MG tablet Take 1 tablet (20 mg total) by mouth 2 (two) times daily as needed. 60 tablet 6   Semaglutide, 2 MG/DOSE, (OZEMPIC, 2 MG/DOSE,) 8 MG/3ML SOPN Inject 2 mg into the skin once a week. 9 mL 3   simvastatin (ZOCOR) 40 MG tablet TAKE 1 TABLET(40 MG) BY MOUTH EVERY EVENING 90 tablet 3       No Known Allergies  Review of Systems: Review of Systems  Constitutional: Negative.   HENT:  Positive for hearing loss.   Eyes: Negative.   Respiratory: Negative.    Cardiovascular:        History of coronary disease, status post PTCI x 2  Gastrointestinal:  Positive for heartburn.       History of gastroesophageal reflux disease  Genitourinary:        Kidney stones  Skin: Negative.   Neurological:         history of mini stroke  Psychiatric/Behavioral: Negative.        Physical Exam: Vital Signs BP 150/83 HR  71 RR  18 SpO2 94% on RA  General: [name] [name] is a pleasant 67 year old male in no distress.  He is accompanied by his wife today. Head: Normocephalic Neck: Trachea midline, no JVD, no carotid bruit.  No palpable adenopathy. Chest: Symmetrical, lungs clear to auscultation Heart: Regular rate and rhythm, no murmur. Extremities: All well-perfused, no peripheral edema Neuro: Grossly nonfocal    Diagnostic Tests: CLINICAL DATA:  Former 84 pack-year smoker, quit 13 years ago.   EXAM: CT CHEST WITHOUT CONTRAST LOW-DOSE FOR LUNG CANCER SCREENING   TECHNIQUE: Multidetector CT imaging of the chest was  performed following the standard protocol without IV contrast.   RADIATION DOSE REDUCTION: This exam was performed according to the departmental dose-optimization program which includes automated exposure control, adjustment of the mA and/or kV according to patient size and/or use of iterative reconstruction technique.   COMPARISON:  None Available.   FINDINGS: Cardiovascular: Atherosclerotic calcification of the aorta, aortic valve and coronary arteries. Heart size normal. No pericardial effusion. Ascending aorta measures 4.0 cm (5/116).   Mediastinum/Nodes: No pathologically  enlarged mediastinal or axillary lymph nodes. Hilar regions are difficult to definitively evaluate without IV contrast. Esophagus is grossly unremarkable.   Lungs/Pleura: Centrilobular and paraseptal emphysema. Minimal scattered pulmonary parenchymal scarring. Pulmonary nodules measure 5.7 mm or less in size. No suspicious pulmonary nodules. No pleural fluid. Airway is unremarkable.   Upper Abdomen: Visualized portions of the liver, gallbladder, adrenal glands and right kidney are grossly unremarkable. Left kidney is atrophic and contains low-attenuation lesions, the largest of which is partially imaged. No specific follow-up necessary. Visualized portions of the spleen, pancreas, stomach and bowel are grossly unremarkable. No upper abdominal adenopathy.   Musculoskeletal: Degenerative changes in the spine.   IMPRESSION: 1. Lung-RADS 2, benign appearance or behavior. Continue annual screening with low-dose chest CT without contrast in 12 months. 2. 4.0 cm ascending aortic aneurysm. Recommend annual imaging followup by CTA or MRA. This recommendation follows 2010 ACCF/AHA/AATS/ACR/ASA/SCA/SCAI/SIR/STS/SVM Guidelines for the Diagnosis and Management of Patients with Thoracic Aortic Disease. Circulation. 2010; 121: W098-J191. Aortic aneurysm NOS (ICD10-I71.9). 3. Aortic atherosclerosis (ICD10-I70.0).  Coronary artery calcification. 4.  Emphysema (ICD10-J43.9).     Electronically Signed   By: Leanna Battles M.D.   On: 07/21/2023 15:23  Impression: 4.0 centimeter thoracic aneurysm noted on lung screening chest  CT.   Plan: Strategies for minimizing risk of aneurysmal dilation were discussed with Mr. Azucena including good blood pressure management, continuation of statin therapy, and avoidance of repetitive strenuous activity.  He is also asked to avoid taking fluoroquinolone antibiotics due to the risk of those agents to weaken connective tissue.  Will plan for follow-up in the office in October 2025 after his annual lung screening chest CT has been completed.   Leary Roca, PA-C [facility] [phone]

## 2023-10-15 ENCOUNTER — Other Ambulatory Visit: Payer: Self-pay | Admitting: Nurse Practitioner

## 2023-10-24 ENCOUNTER — Other Ambulatory Visit: Payer: Self-pay | Admitting: Nurse Practitioner

## 2023-11-04 ENCOUNTER — Ambulatory Visit: Payer: Medicare Other | Admitting: Nurse Practitioner

## 2023-11-04 ENCOUNTER — Encounter: Payer: Self-pay | Admitting: Nurse Practitioner

## 2023-11-04 VITALS — BP 142/82 | HR 78 | Ht 72.0 in | Wt 242.0 lb

## 2023-11-04 DIAGNOSIS — Z7984 Long term (current) use of oral hypoglycemic drugs: Secondary | ICD-10-CM

## 2023-11-04 DIAGNOSIS — E119 Type 2 diabetes mellitus without complications: Secondary | ICD-10-CM | POA: Diagnosis not present

## 2023-11-04 DIAGNOSIS — Z7985 Long-term (current) use of injectable non-insulin antidiabetic drugs: Secondary | ICD-10-CM

## 2023-11-04 LAB — POCT GLYCOSYLATED HEMOGLOBIN (HGB A1C): Hemoglobin A1C: 8.2 % — AB (ref 4.0–5.6)

## 2023-11-04 NOTE — Progress Notes (Signed)
 Endocrinology Follow Up Note       11/04/2023, 11:40 AM   Subjective:    Patient ID: [name] [name], male    DOB: 1956/01/31.  [name] [name] is being seen in follow up after being seen in consultation for management of currently uncontrolled symptomatic diabetes requested by  Toribio Jerel MATSU, MD.   Past Medical History:  Diagnosis Date   Arthritis    Cholelithiasis    Coronary atherosclerosis of native coronary artery    a. s/p DES to RCA in 2005 b. 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   Type 2 diabetes mellitus (HCC)    Urolithiasis     Past Surgical History:  Procedure Laterality Date   Colonoscopy     COLONOSCOPY     CORONARY STENT INTERVENTION N/A 04/16/2022   Procedure: CORONARY STENT INTERVENTION;  Surgeon: Jordan, Peter M, MD;  Location: [facility] INVASIVE CV LAB;  Service: Cardiovascular;  Laterality: N/A;   CORONARY STENT PLACEMENT  2005   CYSTOSCOPY W/ URETERAL STENT PLACEMENT Right 06/22/2023   Procedure: CYSTOSCOPY WITH RETROGRADE PYELOGRAM/URETERAL STENT PLACEMENT;  Surgeon: Sherrilee Belvie CROME, MD;  Location: [facility];  Service: Urology;  Laterality: Right;   LEFT HEART CATH AND CORONARY ANGIOGRAPHY N/A 04/16/2022   Procedure: LEFT HEART CATH AND CORONARY ANGIOGRAPHY;  Surgeon: Jordan, Peter M, MD;  Location: [facility] INVASIVE CV LAB;  Service: Cardiovascular;  Laterality: N/A;   STONE EXTRACTION WITH BASKET Right 06/22/2023   Procedure: STONE EXTRACTION WITH BASKET;  Surgeon: Sherrilee Belvie CROME, MD;  Location: [facility];  Service: Urology;  Laterality: Right;   Testicular tumor resection     Right side    Social History   Socioeconomic History   Marital status:  Married    Spouse name: [name]   Number of children: 0   Years of education: Not on file   Highest education level: Not on file  Occupational History   Occupation: Truck driver  Tobacco Use   Smoking status: Former    Current packs/day: 0.00    Average packs/day: 2.0 packs/day for 43.0 years (86.0 ttl pk-yrs)    Types: Cigarettes    Start date: 04/22/1967    Quit date: 04/21/2010    Years since quitting: 13.5   Smokeless tobacco: Never  Vaping Use   Vaping status: Never Used  Substance and Sexual Activity   Alcohol use: Yes    Comment: occasionally   Drug use: No   Sexual activity: Not on file  Other Topics Concern   Not on file  Social History Narrative   Not on file   Social Drivers of Health   Financial Resource Strain: Not on file  Food Insecurity: No Food Insecurity (06/22/2023)   Hunger Vital Sign    Worried About Running Out of Food in the Last Year: Never true    Ran Out of Food in the Last Year: Never true  Transportation Needs: No  Transportation Needs (06/22/2023)   PRAPARE - Administrator, Civil Service (Medical): No    Lack of Transportation (Non-Medical): No  Physical Activity: Not on file  Stress: Not on file  Social Connections: Not on file    Family History  Problem Relation Age of Onset   Heart disease Father    Diabetes Father    Heart attack Father    Colon cancer Neg Hx    Esophageal cancer Neg Hx    Rectal cancer Neg Hx    Stomach cancer Neg Hx    Colon polyps Neg Hx     Outpatient Encounter Medications as of 11/04/2023  Medication Sig   aspirin  EC 81 MG tablet Take 1 tablet (81 mg total) by mouth daily.   Blood Glucose Monitoring Suppl (ACCU-CHEK GUIDE) w/Device KIT USE TO MONITOR BLOOD GLUCOSE TWICE DAILY   Blood Glucose Monitoring Suppl (ONETOUCH VERIO) w/Device KIT Use to test BG qid. E11.65   fish oil-omega-3 fatty acids 1000 MG capsule Take 1 g by mouth 2 (two) times daily.   glipiZIDE  (GLUCOTROL  XL) 5 MG 24 hr tablet Take  1 tablet (5 mg total) by mouth daily with breakfast.   glucose blood (ACCU-CHEK GUIDE TEST) test strip USE TO CHECK BLOOD SUGAR TWICE DAILY   Lancets MISC 1 each by Does not apply route in the morning and at bedtime.   metFORMIN  (GLUCOPHAGE -XR) 500 MG 24 hr tablet Take 1 tablet (500 mg total) by mouth 2 (two) times daily with a meal.   nitroGLYCERIN  (NITROSTAT ) 0.4 MG SL tablet DISSOLVE 1 TABLET UNDER THE TONGUE EVERY 5 MINUTES FOR 3 DOSES AS NEEDED. MAY REPEAT EVERY 5 MINUTES FOR UP TO 3 DOSES.   NON FORMULARY Omega XL twice daily   pantoprazole  (PROTONIX ) 40 MG tablet TAKE 1 TABLET(40 MG) BY MOUTH DAILY   propranolol  (INDERAL ) 20 MG tablet Take 1 tablet (20 mg total) by mouth 2 (two) times daily as needed.   Semaglutide , 2 MG/DOSE, (OZEMPIC , 2 MG/DOSE,) 8 MG/3ML SOPN Inject 2 mg into the skin once a week.   simvastatin  (ZOCOR ) 40 MG tablet TAKE 1 TABLET(40 MG) BY MOUTH EVERY EVENING   No facility-administered encounter medications on file as of 11/04/2023.    ALLERGIES: No Known Allergies  VACCINATION STATUS: Immunization History  Administered Date(s) Administered   Pneumococcal Polysaccharide-23 07/15/2009   Tdap 03/05/2017   Zoster, Live 02/10/2016    Diabetes He presents for his follow-up diabetic visit. He has type 2 diabetes mellitus. Onset time: diagnosed at approx age of 68. His disease course has been worsening. There are no hypoglycemic associated symptoms. There are no hypoglycemic complications. Diabetic complications include a CVA and heart disease (CAD). Risk factors for coronary artery disease include diabetes mellitus, dyslipidemia, family history, obesity, male sex, hypertension and sedentary lifestyle. Current diabetic treatment includes oral agent (monotherapy) and diet (Ozempic ). He is compliant with treatment most of the time. His weight is fluctuating minimally. He is following a generally healthy diet. When asked about meal planning, he reported none. He has not had a  previous visit with a dietitian. He rarely participates in exercise. His breakfast blood glucose range is generally 180-200 mg/dl. (He presents today, accompanied by his wife, with his logs showing inconsistent glucose monitoring and above target fasting glycemic profile overall.  His POCT A1c today is 8.2%, increasing from last visit of 7.8%.  He continues to work on diet and lifestyle but admits he did fall off for several months during  the holiday season.  He denies any s/s of hypoglycemia.) An ACE inhibitor/angiotensin II receptor blocker is not being taken. He sees a podiatrist.Eye exam is current.    Review of systems  Constitutional: + Minimally fluctuating body weight,  current Body mass index is 32.82 kg/m. , no fatigue, no subjective hyperthermia, no subjective hypothermia Eyes: no blurry vision, no xerophthalmia ENT: no sore throat, no nodules palpated in throat, no dysphagia/odynophagia, no hoarseness Cardiovascular: no chest pain, no shortness of breath, no palpitations, no leg swelling Respiratory: no cough, no shortness of breath Gastrointestinal: no nausea/vomiting/diarrhea Musculoskeletal: no muscle/joint aches Skin: no rashes, no hyperemia Neurological: no tremors, no numbness, no tingling, no dizziness Psychiatric: no depression, no anxiety  Objective:     BP (!) 142/82 (BP Location: Left Arm, Patient Position: Sitting, Cuff Size: Large)   Pulse 78   Ht 6' (1.829 m)   Wt 242 lb (109.8 kg)   BMI 32.82 kg/m   Wt Readings from Last 3 Encounters:  11/04/23 242 lb (109.8 kg)  09/06/23 242 lb (109.8 kg)  07/27/23 240 lb 9.6 oz (109.1 kg)     BP Readings from Last 3 Encounters:  11/04/23 (!) 142/82  09/06/23 (!) 150/83  08/23/23 136/83       Physical Exam- Limited  Constitutional:  Body mass index is 32.82 kg/m. , not in acute distress, normal state of mind Eyes:  EOMI, no exophthalmos Musculoskeletal: no gross deformities, strength intact in all four  extremities, no gross restriction of joint movements Skin:  no rashes, no hyperemia Neurological: no tremor with outstretched hands   Diabetic Foot Exam - Simple   No data filed     CMP ( most recent) CMP     Component Value Date/Time   NA 140 07/21/2023 1354   K 4.8 07/21/2023 1354   CL 101 07/21/2023 1354   CO2 22 07/21/2023 1354   GLUCOSE 217 (H) 07/21/2023 1354   GLUCOSE 160 (H) 06/23/2023 0411   GLUCOSE 107 (H) 07/09/2006 1049   BUN 15 07/21/2023 1354   CREATININE 1.05 07/21/2023 1354   CALCIUM 9.8 07/21/2023 1354   PROT 6.6 07/21/2023 1354   ALBUMIN 4.2 07/21/2023 1354   AST 39 07/21/2023 1354   ALT 49 (H) 07/21/2023 1354   ALKPHOS 72 07/21/2023 1354   BILITOT 0.6 07/21/2023 1354   GFRNONAA 38 (L) 06/23/2023 0411   GFRAA >60 10/26/2015 1936     Diabetic Labs (most recent): Lab Results  Component Value Date   HGBA1C 8.2 (A) 11/04/2023   HGBA1C 7.8 (A) 07/27/2023   HGBA1C 8.0 (A) 04/20/2023   MICROALBUR 30 mg/L 04/20/2023   MICROALBUR <0.7 04/01/2018   MICROALBUR <0.7 03/05/2017     Lipid Panel ( most recent) Lipid Panel     Component Value Date/Time   CHOL 151 08/12/2022 1502   TRIG 211 (H) 08/12/2022 1502   TRIG 96 07/09/2006 1049   HDL 55 08/12/2022 1502   CHOLHDL 2.7 08/12/2022 1502   VLDL 42 (H) 08/12/2022 1502   LDLCALC 54 08/12/2022 1502   LDLDIRECT 57.0 04/01/2018 1408      Lab Results  Component Value Date   TSH 2.100 06/17/2023   TSH 1.18 07/18/2021   TSH 1.27 01/26/2020   TSH 2.34 04/01/2018   TSH 2.14 03/05/2017   TSH 1.27 02/10/2016   TSH 1.35 11/02/2014   TSH 1.64 05/19/2013   TSH 1.21 12/16/2011   TSH 1.27 08/01/2010   FREET4 0.70 07/18/2021  Assessment & Plan:   1) Type 2 Diabetes mellitus without complication without long-term current use of insulin   He presents today, accompanied by his wife, with his logs showing inconsistent glucose monitoring and above target fasting glycemic profile overall.  His  POCT A1c today is 8.2%, increasing from last visit of 7.8%.  He continues to work on diet and lifestyle but admits he did fall off for several months during the holiday season.  He denies any s/s of hypoglycemia.  GLENWOOD [name] JONETTA Glendia has currently uncontrolled symptomatic type 2 DM since 68 years of age.   -Recent labs reviewed.   - I had a long discussion with him about the progressive nature of diabetes and the pathology behind its complications. -his diabetes is complicated by CAD with stents, CVA and he remains at a high risk for more acute and chronic complications which include CAD, CVA, CKD, retinopathy, and neuropathy. These are all discussed in detail with him.  The following Lifestyle Medicine recommendations according to American College of Lifestyle Medicine [facility]) were discussed and offered to patient and he agrees to start the journey:  A. Whole Foods, Plant-based plate comprising of fruits and vegetables, plant-based proteins, whole-grain carbohydrates was discussed in detail with the patient.   A list for source of those nutrients were also provided to the patient.  Patient will use only water  or unsweetened tea for hydration. B.  The need to stay away from risky substances including alcohol, smoking; obtaining 7 to 9 hours of restorative sleep, at least 150 minutes of moderate intensity exercise weekly, the importance of healthy social connections,  and stress reduction techniques were discussed. C.  A full color page of  Calorie density of various food groups per pound showing examples of each food groups was provided to the patient.  - Nutritional counseling repeated at each appointment due to patients tendency to fall back in to old habits.  - The patient admits there is a room for improvement in their diet and drink choices. -  Suggestion is made for the patient to avoid simple carbohydrates from their diet including Cakes, Sweet Desserts / Pastries, Ice Cream, Soda (diet and  regular), Sweet Tea, Candies, Chips, Cookies, Sweet Pastries, Store Bought Juices, Alcohol in Excess of 1-2 drinks a day, Artificial Sweeteners, Coffee Creamer, and Sugar-free Products. This will help patient to have stable blood glucose profile and potentially avoid unintended weight gain.   - I encouraged the patient to switch to unprocessed or minimally processed complex starch and increased protein intake (animal or plant source), fruits, and vegetables.   - Patient is advised to stick to a routine mealtimes to eat 3 meals a day and avoid unnecessary snacks (to snack only to correct hypoglycemia).  - I have approached him with the following individualized plan to manage his diabetes and patient agrees:   -He is advised to continue Metformin  500 md XR twice daily with meal and Ozempic  2 mg SQ weekly.  I advised him to stop the Glipizide  temporarily to see if he may be dumping at night.  I asked he send readings in 2 weeks to reassess.  -he is encouraged to continue monitoring glucose twice daily, before breakfast and before bed, to call the clinic if he has readings less than 70 or above 200 for 3 tests in a row.  - Adjustment parameters are given to him for hypo and hyperglycemia in writing.  - his Actos , Parlodel , and Jardiance  was previously discontinued, risk outweighs benefit  for this patient.  He does not have significant CKD or CHF.  - Specific targets for  A1c; LDL, HDL, and Triglycerides were discussed with the patient.  2) Blood Pressure /Hypertension:  his blood pressure is controlled to target without the use of antihypertensive medications.     3) Lipids/Hyperlipidemia:    Review of his recent lipid panel from 08/12/22 showed controlled LDL at 54 and elevated triglycerides of 211.  he is advised to continue Simvastatin  40 mg daily at bedtime.  Side effects and precautions discussed with him.  He has annual labs coming up with PCP.  I asked they bring copy of results to the  office so we can enter into the system.  4)  Weight/Diet:  his Body mass index is 32.82 kg/m.  -  clearly complicating his diabetes care.   he is a candidate for weight loss. I discussed with him the fact that loss of 5 - 10% of his  current body weight will have the most impact on his diabetes management.  Exercise, and detailed carbohydrates information provided  -  detailed on discharge instructions.  5) Chronic Care/Health Maintenance: -he is not on ACEI/ARB and is on Statin medications and is encouraged to initiate and continue to follow up with Ophthalmology, Dentist, Podiatrist at least yearly or according to recommendations, and advised to stay away from smoking. I have recommended yearly flu vaccine and pneumonia vaccine at least every 5 years; moderate intensity exercise for up to 150 minutes weekly; and sleep for at least 7 hours a day.  - he is advised to maintain close follow up with Toribio Jerel MATSU, MD for primary care needs, as well as his other providers for optimal and coordinated care.     I spent  22  minutes in the care of the patient today including review of labs from CMP, Lipids, Thyroid  Function, Hematology (current and previous including abstractions from other facilities); face-to-face time discussing  his blood glucose readings/logs, discussing hypoglycemia and hyperglycemia episodes and symptoms, medications doses, his options of short and long term treatment based on the latest standards of care / guidelines;  discussion about incorporating lifestyle medicine;  and documenting the encounter. Risk reduction counseling performed per USPSTF guidelines to reduce obesity and cardiovascular risk factors.     Please refer to Patient Instructions for Blood Glucose Monitoring and Insulin /Medications Dosing Guide  in media tab for additional information. Please  also refer to  Patient Self Inventory in the Media  tab for reviewed elements of pertinent patient history.  [name] [name] participated in the discussions, expressed understanding, and voiced agreement with the above plans.  All questions were answered to his satisfaction. he is encouraged to contact clinic should he have any questions or concerns prior to his return visit.     Follow up plan: - Return in about 3 months (around 02/01/2024) for Diabetes F/U with A1c in office, No previsit labs, Bring meter and logs.   Benton Rio, [facility] [address] [place], [place] [address] Phone: [phone] Fax: [phone]  11/04/2023, 11:40 AM

## 2023-11-09 DIAGNOSIS — R5383 Other fatigue: Secondary | ICD-10-CM | POA: Diagnosis not present

## 2023-11-09 DIAGNOSIS — E7849 Other hyperlipidemia: Secondary | ICD-10-CM | POA: Diagnosis not present

## 2023-11-09 DIAGNOSIS — K760 Fatty (change of) liver, not elsewhere classified: Secondary | ICD-10-CM | POA: Diagnosis not present

## 2023-11-09 DIAGNOSIS — I10 Essential (primary) hypertension: Secondary | ICD-10-CM | POA: Diagnosis not present

## 2023-11-09 DIAGNOSIS — E1159 Type 2 diabetes mellitus with other circulatory complications: Secondary | ICD-10-CM | POA: Diagnosis not present

## 2023-11-09 LAB — LAB REPORT - SCANNED: EGFR: 67.3

## 2023-11-12 ENCOUNTER — Telehealth: Payer: Self-pay | Admitting: *Deleted

## 2023-11-12 NOTE — Telephone Encounter (Signed)
Patient's wife left a message. She states that patient saw [name] on 11/04/23, and was ask to stop the Glipizide. Since he has stopped , his blood sugars have been running in the 200's every morning and every night. They were to wait and send in a copy of his readings 2 weeks after stopping the Glipizide. She states that they will continue to do what they have been doing until they hear back from [facility].

## 2023-11-12 NOTE — Telephone Encounter (Signed)
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

## 2023-11-15 NOTE — Telephone Encounter (Signed)
 Patient was called and made aware.

## 2023-11-16 DIAGNOSIS — E119 Type 2 diabetes mellitus without complications: Secondary | ICD-10-CM | POA: Diagnosis not present

## 2023-11-16 DIAGNOSIS — E7800 Pure hypercholesterolemia, unspecified: Secondary | ICD-10-CM | POA: Diagnosis not present

## 2023-11-16 DIAGNOSIS — Z6832 Body mass index (BMI) 32.0-32.9, adult: Secondary | ICD-10-CM | POA: Diagnosis not present

## 2023-11-16 DIAGNOSIS — R251 Tremor, unspecified: Secondary | ICD-10-CM | POA: Diagnosis not present

## 2023-11-16 DIAGNOSIS — I2510 Atherosclerotic heart disease of native coronary artery without angina pectoris: Secondary | ICD-10-CM | POA: Diagnosis not present

## 2023-11-16 DIAGNOSIS — R030 Elevated blood-pressure reading, without diagnosis of hypertension: Secondary | ICD-10-CM | POA: Diagnosis not present

## 2023-11-17 ENCOUNTER — Encounter: Payer: Self-pay | Admitting: Internal Medicine

## 2023-11-21 ENCOUNTER — Encounter: Payer: Self-pay | Admitting: Nurse Practitioner

## 2023-11-23 ENCOUNTER — Other Ambulatory Visit: Payer: Self-pay | Admitting: Nurse Practitioner

## 2023-11-29 ENCOUNTER — Other Ambulatory Visit (HOSPITAL_COMMUNITY): Payer: Self-pay

## 2023-12-08 DIAGNOSIS — R0781 Pleurodynia: Secondary | ICD-10-CM | POA: Diagnosis not present

## 2023-12-08 DIAGNOSIS — Z6832 Body mass index (BMI) 32.0-32.9, adult: Secondary | ICD-10-CM | POA: Diagnosis not present

## 2023-12-10 DIAGNOSIS — K080 Exfoliation of teeth due to systemic causes: Secondary | ICD-10-CM | POA: Diagnosis not present

## 2023-12-17 ENCOUNTER — Other Ambulatory Visit: Payer: Self-pay | Admitting: Nurse Practitioner

## 2023-12-17 ENCOUNTER — Other Ambulatory Visit: Payer: Self-pay | Admitting: Neurology

## 2024-01-07 ENCOUNTER — Other Ambulatory Visit: Payer: Self-pay | Admitting: Nurse Practitioner

## 2024-01-07 DIAGNOSIS — E1151 Type 2 diabetes mellitus with diabetic peripheral angiopathy without gangrene: Secondary | ICD-10-CM

## 2024-01-10 ENCOUNTER — Ambulatory Visit: Payer: Medicare Other | Admitting: Cardiology

## 2024-01-13 ENCOUNTER — Ambulatory Visit: Payer: Medicare Other | Attending: Cardiology | Admitting: Cardiology

## 2024-01-13 ENCOUNTER — Encounter: Payer: Self-pay | Admitting: Cardiology

## 2024-01-13 VITALS — BP 154/86 | HR 71 | Ht 72.0 in | Wt 236.7 lb

## 2024-01-13 DIAGNOSIS — I10 Essential (primary) hypertension: Secondary | ICD-10-CM

## 2024-01-13 DIAGNOSIS — I25119 Atherosclerotic heart disease of native coronary artery with unspecified angina pectoris: Secondary | ICD-10-CM

## 2024-01-13 DIAGNOSIS — Z79899 Other long term (current) drug therapy: Secondary | ICD-10-CM | POA: Diagnosis not present

## 2024-01-13 DIAGNOSIS — E782 Mixed hyperlipidemia: Secondary | ICD-10-CM | POA: Diagnosis not present

## 2024-01-13 MED ORDER — LOSARTAN POTASSIUM 25 MG PO TABS: 25.0000 mg | ORAL_TABLET | Freq: Every day | ORAL | 3 refills | Status: DC

## 2024-01-13 NOTE — Patient Instructions (Addendum)
 Medication Instructions:   START Losartan 25 mg daily  Labwork: BMET in 2 weeks  (May 2 nd)   Testing/Procedures: None today  Follow-Up: 6 months  Any Other Special Instructions Will Be Listed Below (If Applicable).  If you need a refill on your cardiac medications before your next appointment, please call your pharmacy.

## 2024-01-13 NOTE — Progress Notes (Signed)
    Cardiology Office Note  Date: 01/13/2024   ID: [name] [name], DOB [birth year], MRN [id number]  History of Present Illness: [name] [name] is a 68 y.o. male last seen in October 2024.  He is here today with his wife for a follow-up visit.  Reports no angina or interval nitroglycerin use.  Still working on glucose control with endocrinology.  He describes NYHA class I-II dyspnea, no palpitations or syncope.  We went over his medications today.  Discussed addition of Cozaar for both blood pressure control and also in the setting of type 2 diabetes mellitus for renal protection.  He states that when he checks his blood pressure at home it is not unusual for his systolics to be in the 140s.  I reviewed his lab work from February.  Physical Exam: VS:  BP (!) 154/86   Pulse 71   Ht 6' (1.829 m)   Wt 236 lb 11.2 oz (107.4 kg)   SpO2 97%   BMI 32.10 kg/m , BMI Body mass index is 32.1 kg/m.  Wt Readings from Last 3 Encounters:  01/13/24 236 lb 11.2 oz (107.4 kg)  11/04/23 242 lb (109.8 kg)  09/06/23 242 lb (109.8 kg)    General: Patient appears comfortable at rest. HEENT: Conjunctiva and lids normal. Neck: Supple, no elevated JVP or carotid bruits. Lungs: Clear to auscultation, nonlabored breathing at rest. Cardiac: Regular rate and rhythm, no S3 or significant systolic murmur. Extremities: No pitting edema.  ECG:  An ECG dated 07/06/2023 was personally reviewed today and demonstrated:  Sinus rhythm.  Labwork: 06/17/2023: TSH 2.100 06/23/2023: Hemoglobin 13.6; Magnesium 1.6; Platelets 110 07/21/2023: ALT 49; AST 39; BUN 15; Creatinine, Ser 1.05; Potassium 4.8; Sodium 140  February 2025: Cholesterol 134, triglycerides 137, HDL 51, LDL 55, hemoglobin 14.9, potassium 4.8, BUN 16, creatinine 1.17, GFR 67  Other Studies Reviewed Today:  No interval cardiac testing for review today.  Assessment and Plan:  1.  CAD status post DES to the RCA in 2005 and DES to the RCA in July 2023.   No angina or interval nitroglycerin use reported.  Continue aspirin 81 mg daily, Zocor 40 mg daily, and as needed nitroglycerin.   2.  Mixed hyperlipidemia.  Recent LDL 55 on Zocor 40 mg daily.   3.  Primary hypertension.  Start Cozaar 25 mg daily.  Follow-up BMET in 2 weeks.   4.  Type 2 diabetes mellitus now following with [facility].  Hemoglobin A1c 8.2% in February.  He is currently on glipizide 5 mg daily, metformin 500 mg twice daily, and Ozempic.  Disposition:  Follow up  6 months.  Signed, Gerard Knight, M.D., F.A.C.C. [facility]

## 2024-01-19 ENCOUNTER — Other Ambulatory Visit (INDEPENDENT_AMBULATORY_CARE_PROVIDER_SITE_OTHER)

## 2024-01-19 ENCOUNTER — Encounter: Payer: Self-pay | Admitting: Internal Medicine

## 2024-01-19 ENCOUNTER — Ambulatory Visit: Payer: Medicare Other | Admitting: Internal Medicine

## 2024-01-19 VITALS — BP 118/64 | HR 82 | Ht 72.0 in | Wt 236.0 lb

## 2024-01-19 DIAGNOSIS — D696 Thrombocytopenia, unspecified: Secondary | ICD-10-CM | POA: Diagnosis not present

## 2024-01-19 DIAGNOSIS — R194 Change in bowel habit: Secondary | ICD-10-CM

## 2024-01-19 DIAGNOSIS — R748 Abnormal levels of other serum enzymes: Secondary | ICD-10-CM

## 2024-01-19 DIAGNOSIS — K760 Fatty (change of) liver, not elsewhere classified: Secondary | ICD-10-CM | POA: Diagnosis not present

## 2024-01-19 DIAGNOSIS — R1013 Epigastric pain: Secondary | ICD-10-CM

## 2024-01-19 LAB — CBC WITH DIFFERENTIAL/PLATELET
Basophils Absolute: 0 10*3/uL (ref 0.0–0.1)
Basophils Relative: 0.6 % (ref 0.0–3.0)
Eosinophils Absolute: 0.2 10*3/uL (ref 0.0–0.7)
Eosinophils Relative: 2.5 % (ref 0.0–5.0)
HCT: 43.5 % (ref 39.0–52.0)
Hemoglobin: 14.9 g/dL (ref 13.0–17.0)
Lymphocytes Relative: 37.5 % (ref 12.0–46.0)
Lymphs Abs: 2.6 10*3/uL (ref 0.7–4.0)
MCHC: 34.3 g/dL (ref 30.0–36.0)
MCV: 85.9 fl (ref 78.0–100.0)
Monocytes Absolute: 0.4 10*3/uL (ref 0.1–1.0)
Monocytes Relative: 5.9 % (ref 3.0–12.0)
Neutro Abs: 3.7 10*3/uL (ref 1.4–7.7)
Neutrophils Relative %: 53.5 % (ref 43.0–77.0)
Platelets: 140 10*3/uL — ABNORMAL LOW (ref 150.0–400.0)
RBC: 5.06 Mil/uL (ref 4.22–5.81)
RDW: 13.9 % (ref 11.5–15.5)
WBC: 6.8 10*3/uL (ref 4.0–10.5)

## 2024-01-19 LAB — COMPREHENSIVE METABOLIC PANEL WITH GFR
ALT: 38 U/L (ref 0–53)
AST: 34 U/L (ref 0–37)
Albumin: 4.4 g/dL (ref 3.5–5.2)
Alkaline Phosphatase: 59 U/L (ref 39–117)
BUN: 18 mg/dL (ref 6–23)
CO2: 25 meq/L (ref 19–32)
Calcium: 9.3 mg/dL (ref 8.4–10.5)
Chloride: 100 meq/L (ref 96–112)
Creatinine, Ser: 1.03 mg/dL (ref 0.40–1.50)
GFR: 74.08 mL/min (ref >60.00)
Glucose, Bld: 273 mg/dL — ABNORMAL HIGH (ref 70–99)
Potassium: 4.4 meq/L (ref 3.5–5.1)
Sodium: 134 meq/L — ABNORMAL LOW (ref 135–145)
Total Bilirubin: 0.8 mg/dL (ref 0.2–1.2)
Total Protein: 7.1 g/dL (ref 6.0–8.3)

## 2024-01-19 LAB — VITAMIN B12: Vitamin B-12: 260 pg/mL (ref 211–911)

## 2024-01-19 LAB — PROTIME-INR
INR: 1.1 ratio — ABNORMAL HIGH (ref 0.8–1.0)
Prothrombin Time: 11.4 s (ref 9.6–13.1)

## 2024-01-19 LAB — FERRITIN: Ferritin: 293.7 ng/mL (ref 22.0–322.0)

## 2024-01-19 NOTE — Patient Instructions (Addendum)
 Your provider has requested that you go to the basement level for lab work before leaving today. Press "B" on the elevator. The lab is located at the first door on the left as you exit the elevator.  Due to recent changes in healthcare laws, you may see the results of your imaging and laboratory studies on MyChart before your provider has had a chance to review them.  We understand that in some cases there may be results that are confusing or concerning to you. Not all laboratory results come back in the same time frame and the provider may be waiting for multiple results in order to interpret others.  Please give us  48 hours in order for your provider to thoroughly review all the results before contacting the office for clarification of your results.   Cut down/eliminate starchy foods as discussed.   Take one tablespoon of metamucil daily.  You have been scheduled for an abdominal ultrasound at [facility] on 02/02/2024 at 8:30am. Please arrive 15 minutes prior to your appointment for registration. Make certain not to have anything to eat or drink 6 hours prior to your appointment. Should you need to reschedule your appointment, please contact radiology at [phone]. This test typically takes about 30 minutes to perform.   I appreciate the opportunity to care for you. Loy Ruff, MD, [facility]

## 2024-01-19 NOTE — Progress Notes (Signed)
 [name] [name] 68 y.o. 05/30/1956 [id number]  Assessment & Plan:   Encounter Diagnoses  Name Primary?   Metabolic dysfunction-associated fatty liver disease (MAFLD) suspected Yes   Abnormal transaminases    Thrombocytopenia (HCC)    Abdominal pain, epigastric    Altered bowel habits ? IBS mixed    Several issues to address here:  #1-liver issues, I do suspect metabolic dysfunction associated fatty liver disease but we will workup further with blood test below.  Thrombocytopenia raises concern for cirrhosis.  Ultrasound is ordered.  Could need further imaging.  We discussed reducing and trying to eliminated white starchy foods such as potatoes and bread.  Drinks a large amount of diet [employer].  Recommended exchanging some of that for water .  He is on Ozempic  but apparently not losing weight with that.  #2-epigastric pain question biliary colic.  Await ultrasound result.  Cardiology note reviewed it indicated the patient did not report angina but this epigastric pain was not mentioned or specifically addressed.  This is very atypical for cardiac pain but could need to revisit.  If there are gallstones present would consider surgical evaluation though underlying liver disease could impact that.  #3 altered bowel habits-I think he has problems with inadequate defecation leading to diarrhea and somewhat of an IBS mixed pattern.  I have recommended a tablespoon of psyllium i.e. Metamucil daily.  He may need to use laxatives to promote better defecation or switch to MiraLAX.  Will follow-up clinically.  Recent colonoscopy 2024 does not need to be repeated.   Orders Placed This Encounter  Procedures   US  Abdomen Limited RUQ (LIVER/GB)   Ferritin   Hepatitis A antibody, total   Hepatitis B core antibody, total   Hepatitis B surface antibody,qualitative   Hepatitis B surface antigen   Hepatitis C antibody   ANA   Alpha-1-antitrypsin   CBC with Differential/Platelet   Comprehensive  metabolic panel with GFR   Vitamin B12   Protime-INR   CC: [name] Pollard, MD     Subjective:   Chief Complaint: Abnormal LFTs/fatty liver, abdominal pain and diarrhea/loose stools  HPI Discussed the use of AI scribe software for clinical note transcription with the patient, who gave verbal consent to proceed.   [name] [name] "[name]" is a 68 year old male who presents with abnormal liver chemistries and bowel complaints. He was referred by Dr. Broadus Canes for evaluation of abnormal liver chemistries and a concern about fatty liver.  He was recently informed of abnormal liver chemistries. He denies any known liver problems, family history of liver disease, or history of infectious hepatitis. He has not had an ultrasound of the liver but has had kidney ultrasounds. He has a history of alcohol use but no current use.  Says he has not really drunk regularly in years there may be some very rare use.  He experiences bowel movement irregularities, with a pattern of not going for a few days followed by painful bowel movements that provide some relief. He experiences pain in the lower abdomen which is relieved somewhat after defecation. He does not take any regular medication to aid bowel movements but occasionally uses fiber supplements. He drinks Diet Mountain Dew, tea, and coffee, and acknowledges not drinking enough water . He has a low intake of vegetables and is trying to reduce bread and potato consumption.  He experiences intermittent pain in the upper chest area lasting from 15 minutes to several hours, occurring every two to three days for  the past two months. The pain is not associated with eating and does not radiate to the back. No nausea or vomiting. He has a history of chronic back pain and muscle spasms, which he attributes to increased physical activity.  He has a history of heart issues, including two heart stents, and sees a cardiologist every six months. He last saw his  cardiologist last week, who did not attribute his current symptoms to cardiac issues. He also has a history of kidney stones.       Scanned lab report from primary care 11/17/2023 showing abnormal transaminases but these are illegible due to the scan.  Platelets are apparently low also.  Alk phos 56 bilirubin 0.9.  A1c was 8.2% February 6.   07/21/2023 AST 39 ALT 49 bilirubin 0.6 alk phos 72 and platelets were 110  03/24/2023 CT abdomen and pelvis with mild hepatic steatosis atrophic left kidney and hypertrophied right kidney, 3 cm infrarenal abdominal aortic aneurysm and atherosclerosis   Colonoscopy 04/07/2023 - Two diminutive polyps in the descending colon and in the ascending colon, removed with a cold snare. Resected and retrieved. - Diverticulosis in the sigmoid colon. - Medium-sized lipoma in the sigmoid colon and in the ascending colon. - The examination was otherwise normal on direct and retroflexion views. - Personal history of colonic polyps. 7 diminutive adenomas 2020  Pathology was 2 adenomas plan recall 2029    No Known Allergies Current Meds  Medication Sig   Accu-Chek Softclix Lancets lancets USE EVERY MORNING AND AT BEDTIME   aspirin  EC 81 MG tablet Take 1 tablet (81 mg total) by mouth daily.   Blood Glucose Monitoring Suppl (ONETOUCH VERIO) w/Device KIT Use to test BG qid. E11.65   fish oil-omega-3 fatty acids 1000 MG capsule Take 1 g by mouth 2 (two) times daily.   glipiZIDE  (GLUCOTROL  XL) 5 MG 24 hr tablet Take 1 tablet (5 mg total) by mouth daily with breakfast.   losartan  (COZAAR ) 25 MG tablet Take 1 tablet (25 mg total) by mouth daily.   metFORMIN  (GLUCOPHAGE -XR) 500 MG 24 hr tablet Take 1 tablet (500 mg total) by mouth 2 (two) times daily with a meal.   nitroGLYCERIN  (NITROSTAT ) 0.4 MG SL tablet DISSOLVE 1 TABLET UNDER THE TONGUE EVERY 5 MINUTES FOR 3 DOSES AS NEEDED. MAY REPEAT EVERY 5 MINUTES FOR UP TO 3 DOSES.   NON FORMULARY Omega XL twice daily   pantoprazole   (PROTONIX ) 40 MG tablet TAKE 1 TABLET(40 MG) BY MOUTH DAILY   Semaglutide , 2 MG/DOSE, (OZEMPIC , 2 MG/DOSE,) 8 MG/3ML SOPN INJECT 2MG  UNDER THE SKIN ONCE A WEEK   simvastatin  (ZOCOR ) 40 MG tablet TAKE 1 TABLET(40 MG) BY MOUTH EVERY EVENING   Past Medical History:  Diagnosis Date   Arthritis    Cholelithiasis    Coronary atherosclerosis of native coronary artery    a. s/p DES to RCA in 2005 b. 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   Type 2 diabetes mellitus (HCC)    Urolithiasis    Past Surgical History:  Procedure Laterality Date   Colonoscopy     COLONOSCOPY     CORONARY STENT INTERVENTION N/A 04/16/2022   Procedure: CORONARY STENT INTERVENTION;  Surgeon: Swazil[place], Peter M, MD;  Location: [facility];  Service: Cardiovascular;  Laterality: N/A;   CORONARY STENT PLACEMENT  2005   CYSTOSCOPY W/ URETERAL STENT PLACEMENT Right 06/22/2023   Procedure: CYSTOSCOPY WITH RETROGRADE PYELOGRAM/URETERAL STENT PLACEMENT;  Surgeon: Marco Severs, MD;  Location: [facility];  Service: Urology;  Laterality: Right;   LEFT HEART CATH AND CORONARY ANGIOGRAPHY N/A 04/16/2022   Procedure: LEFT HEART CATH AND CORONARY ANGIOGRAPHY;  Surgeon: [place], Peter M, MD;  [facility];  Service: Cardiovascular;  Laterality: N/A;   STONE EXTRACTION WITH BASKET Right 06/22/2023   Procedure: STONE EXTRACTION WITH BASKET;  Surgeon: Marco Severs, MD;  Location: [facility];  Service: Urology;  Laterality: Right;   Testicular tumor resection     Right side   Social History   Social History Narrative   Retired Naval architect   Married   No EtOH or very minimal   No children   Former smoker   No drug use   family history includes Diabetes in his father;  Heart attack in his father; Heart disease in his father.   Review of Systems As per HPI.  Objective:   Physical Exam @BP  118/64   Pulse 82   Ht 6' (1.829 m)   Wt 236 lb (107 kg)   BMI 32.01 kg/m @  General:  Well-developed, well-nourished and in no acute distress Eyes:  anicteric. ENT:   Mouth and posterior pharynx free of lesions.  Neck:   supple w/o thyromegaly or mass.  Lungs: Clear to auscultation bilaterally. Heart:  S1S2, no rubs, murmurs, gallops. Abdomen:  Obese soft, non-tender, no hepatosplenomegaly, hernia, or mass and BS+.  Lymph:  no cervical or supraclavicular adenopathy. Extremities:   no edema, cyanosis or clubbing Skin   no rash. No spider angiomata Neuro:  A&O x 3.  Psych:  appropriate mood and  Affect.   Data Reviewed: See HPI

## 2024-01-22 LAB — ANA: Anti Nuclear Antibody (ANA): POSITIVE — AB

## 2024-01-22 LAB — HEPATITIS B CORE ANTIBODY, TOTAL: Hep B Core Total Ab: NONREACTIVE

## 2024-01-22 LAB — HEPATITIS C ANTIBODY: Hepatitis C Ab: NONREACTIVE

## 2024-01-22 LAB — ALPHA-1-ANTITRYPSIN: A-1 Antitrypsin, Ser: 149 mg/dL (ref 83–199)

## 2024-01-22 LAB — ANTI-NUCLEAR AB-TITER (ANA TITER): ANA Titer 1: 1:40 {titer} — ABNORMAL HIGH

## 2024-01-22 LAB — HEPATITIS A ANTIBODY, TOTAL: Hepatitis A AB,Total: NONREACTIVE

## 2024-01-22 LAB — HEPATITIS B SURFACE ANTIGEN: Hepatitis B Surface Ag: NONREACTIVE

## 2024-01-22 LAB — HEPATITIS B SURFACE ANTIBODY,QUALITATIVE: Hep B S Ab: NONREACTIVE

## 2024-01-24 ENCOUNTER — Telehealth: Payer: Self-pay

## 2024-01-24 DIAGNOSIS — R748 Abnormal levels of other serum enzymes: Secondary | ICD-10-CM

## 2024-01-24 DIAGNOSIS — K760 Fatty (change of) liver, not elsewhere classified: Secondary | ICD-10-CM

## 2024-01-24 NOTE — Telephone Encounter (Signed)
 I discussed lab results with [name] and his wife on speaker phone. They request I mail them the results and plans. Confirmed address. I have changed his U/S to one with elastography. He is going to check with his PCP about the vaccines: A, B, and pneumonia.

## 2024-01-28 ENCOUNTER — Other Ambulatory Visit (HOSPITAL_COMMUNITY)
Admission: RE | Admit: 2024-01-28 | Discharge: 2024-01-28 | Disposition: A | Discharge status: Home / Self Care | Source: Ambulatory Visit | Attending: Cardiology | Admitting: Cardiology

## 2024-01-28 DIAGNOSIS — Z79899 Other long term (current) drug therapy: Secondary | ICD-10-CM | POA: Insufficient documentation

## 2024-01-28 LAB — BASIC METABOLIC PANEL WITH GFR
Anion gap: 8 (ref 5–15)
BUN: 16 mg/dL (ref 8–23)
CO2: 23 mmol/L (ref 22–32)
Calcium: 9.3 mg/dL (ref 8.9–10.3)
Chloride: 104 mmol/L (ref 98–111)
Creatinine, Ser: 1.05 mg/dL (ref 0.61–1.24)
GFR, Estimated: >60 mL/min (ref >60)
Glucose, Bld: 201 mg/dL — ABNORMAL HIGH (ref 70–99)
Potassium: 4.7 mmol/L (ref 3.5–5.1)
Sodium: 135 mmol/L (ref 135–145)

## 2024-01-31 DIAGNOSIS — K080 Exfoliation of teeth due to systemic causes: Secondary | ICD-10-CM | POA: Diagnosis not present

## 2024-02-01 ENCOUNTER — Encounter: Payer: Self-pay | Admitting: Nurse Practitioner

## 2024-02-01 ENCOUNTER — Ambulatory Visit: Payer: Medicare Other | Admitting: Nurse Practitioner

## 2024-02-01 VITALS — BP 130/80 | HR 74 | Ht 72.0 in | Wt 239.8 lb

## 2024-02-01 DIAGNOSIS — E782 Mixed hyperlipidemia: Secondary | ICD-10-CM | POA: Diagnosis not present

## 2024-02-01 DIAGNOSIS — E119 Type 2 diabetes mellitus without complications: Secondary | ICD-10-CM | POA: Diagnosis not present

## 2024-02-01 DIAGNOSIS — Z7985 Long-term (current) use of injectable non-insulin antidiabetic drugs: Secondary | ICD-10-CM

## 2024-02-01 DIAGNOSIS — Z7984 Long term (current) use of oral hypoglycemic drugs: Secondary | ICD-10-CM

## 2024-02-01 DIAGNOSIS — E1151 Type 2 diabetes mellitus with diabetic peripheral angiopathy without gangrene: Secondary | ICD-10-CM

## 2024-02-01 LAB — POCT GLYCOSYLATED HEMOGLOBIN (HGB A1C): Hemoglobin A1C: 8.2 % — AB (ref 4.0–5.6)

## 2024-02-01 MED ORDER — METFORMIN HCL ER 500 MG PO TB24: 500.0000 mg | ORAL_TABLET | Freq: Two times a day (BID) | ORAL | 0 refills | Status: DC

## 2024-02-01 MED ORDER — TIRZEPATIDE 7.5 MG/0.5ML ~~LOC~~ SOAJ: 7.5000 mg | SUBCUTANEOUS | 1 refills | Status: DC

## 2024-02-01 NOTE — Progress Notes (Signed)
 Endocrinology Follow Up Note       02/01/2024, 11:44 AM   Subjective:    Patient ID: [name] [name], male    DOB: [birth year].  [name] [name] is being seen in follow up after being seen in consultation for management of currently uncontrolled symptomatic diabetes requested by  Lauran Pollard, MD.   Past Medical History:  Diagnosis Date   Arthritis    Cholelithiasis    Coronary atherosclerosis of native coronary artery    a. s/p DES to RCA in 2005 b. 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   Type 2 diabetes mellitus (HCC)    Urolithiasis     Past Surgical History:  Procedure Laterality Date   Colonoscopy     COLONOSCOPY     CORONARY STENT INTERVENTION N/A 04/16/2022   Procedure: CORONARY STENT INTERVENTION;  Surgeon: Swaziland, Peter M, MD;  Location: [facility] INVASIVE CV LAB;  Service: Cardiovascular;  Laterality: N/A;   CORONARY STENT PLACEMENT  2005   CYSTOSCOPY W/ URETERAL STENT PLACEMENT Right 06/22/2023   Procedure: CYSTOSCOPY WITH RETROGRADE PYELOGRAM/URETERAL STENT PLACEMENT;  Surgeon: Marco Severs, MD;  Location: [facility];  Service: Urology;  Laterality: Right;   LEFT HEART CATH AND CORONARY ANGIOGRAPHY N/A 04/16/2022   Procedure: LEFT HEART CATH AND CORONARY ANGIOGRAPHY;  Surgeon: Swaziland, Peter M, MD;  Location: [facility] INVASIVE CV LAB;  Service: Cardiovascular;  Laterality: N/A;   STONE EXTRACTION WITH BASKET Right 06/22/2023   Procedure: STONE EXTRACTION WITH BASKET;  Surgeon: Marco Severs, MD;  Location: [facility];  Service: Urology;  Laterality: Right;   Testicular tumor resection     Right side    Social History   Socioeconomic History   Marital status:  Married    Spouse name: [name]   Number of children: 0   Years of education: Not on file   Highest education level: Not on file  Occupational History   Occupation: Truck driver  Tobacco Use   Smoking status: Former    Current packs/day: 0.00    Average packs/day: 2.0 packs/day for 43.0 years (86.0 ttl pk-yrs)    Types: Cigarettes    Start date: 04/22/1967    Quit date: 04/21/2010    Years since quitting: 13.7   Smokeless tobacco: Never  Vaping Use   Vaping status: Never Used  Substance and Sexual Activity   Alcohol use: Yes    Comment: occasionally   Drug use: No   Sexual activity: Not on file  Other Topics Concern   Not on file  Social History Narrative   Retired Naval architect   Married   No EtOH or very minimal   No children   Former smoker   No drug use   Social Drivers of Corporate investment banker Strain: Not on file  Food Insecurity: No Food Insecurity (06/22/2023)   Hunger Vital Sign    Worried About Running Out of Food  in the Last Year: Never true    Ran Out of Food in the Last Year: Never true  Transportation Needs: No Transportation Needs (06/22/2023)   PRAPARE - Administrator, Civil Service (Medical): No    Lack of Transportation (Non-Medical): No  Physical Activity: Not on file  Stress: Not on file  Social Connections: Not on file    Family History  Problem Relation Age of Onset   Heart disease Father    Diabetes Father    Heart attack Father    Colon cancer Neg Hx    Esophageal cancer Neg Hx    Rectal cancer Neg Hx    Stomach cancer Neg Hx    Colon polyps Neg Hx     Outpatient Encounter Medications as of 02/01/2024  Medication Sig   Accu-Chek Softclix Lancets lancets USE EVERY MORNING AND AT BEDTIME   aspirin  EC 81 MG tablet Take 1 tablet (81 mg total) by mouth daily.   Blood Glucose Monitoring Suppl (ONETOUCH VERIO) w/Device KIT Use to test BG qid. E11.65   fish oil-omega-3 fatty acids 1000 MG capsule Take 1 g by mouth 2 (two) times  daily.   losartan  (COZAAR ) 25 MG tablet Take 1 tablet (25 mg total) by mouth daily.   nitroGLYCERIN  (NITROSTAT ) 0.4 MG SL tablet DISSOLVE 1 TABLET UNDER THE TONGUE EVERY 5 MINUTES FOR 3 DOSES AS NEEDED. MAY REPEAT EVERY 5 MINUTES FOR UP TO 3 DOSES.   NON FORMULARY Omega XL twice daily   pantoprazole  (PROTONIX ) 40 MG tablet TAKE 1 TABLET(40 MG) BY MOUTH DAILY   simvastatin  (ZOCOR ) 40 MG tablet TAKE 1 TABLET(40 MG) BY MOUTH EVERY EVENING   tirzepatide (MOUNJARO) 7.5 MG/0.5ML Pen Inject 7.5 mg into the skin once a week.   [DISCONTINUED] glipiZIDE  (GLUCOTROL  XL) 5 MG 24 hr tablet Take 1 tablet (5 mg total) by mouth daily with breakfast.   [DISCONTINUED] metFORMIN  (GLUCOPHAGE -XR) 500 MG 24 hr tablet Take 1 tablet (500 mg total) by mouth 2 (two) times daily with a meal.   [DISCONTINUED] Semaglutide , 2 MG/DOSE, (OZEMPIC , 2 MG/DOSE,) 8 MG/3ML SOPN INJECT 2MG  UNDER THE SKIN ONCE A WEEK   metFORMIN  (GLUCOPHAGE -XR) 500 MG 24 hr tablet Take 1 tablet (500 mg total) by mouth 2 (two) times daily with a meal.   No facility-administered encounter medications on file as of 02/01/2024.    ALLERGIES: No Known Allergies  VACCINATION STATUS: Immunization History  Administered Date(s) Administered   Pneumococcal Polysaccharide-23 07/15/2009   Tdap 03/05/2017   Zoster, Live 02/10/2016    Diabetes He presents for his follow-up diabetic visit. He has type 2 diabetes mellitus. Onset time: diagnosed at approx age of 68. His disease course has been stable. There are no hypoglycemic associated symptoms. There are no hypoglycemic complications. Diabetic complications include a CVA and heart disease (CAD). Risk factors for coronary artery disease include diabetes mellitus, dyslipidemia, family history, obesity, male sex, hypertension and sedentary lifestyle. Current diabetic treatment includes oral agent (monotherapy) and diet (Ozempic ). He is compliant with treatment most of the time. His weight is fluctuating minimally.  He is following a generally healthy diet. When asked about meal planning, he reported none. He has not had a previous visit with a dietitian. He rarely participates in exercise. His breakfast blood glucose range is generally 180-200 mg/dl. (He presents today, accompanied by his wife, with his logs showing above target fasting glycemic profile.  His POCT A1c today is 8.2%, unchanged from last visit.  He increased his  Glipizide  on his own, now taking 5 mg XL twice daily.  He has seen GI specialist since last visit and is being evaluated for MAFLD.  Wife reports his diet continues to be very poor, high in concentrated sweets.) An ACE inhibitor/angiotensin II receptor blocker is not being taken. He sees a podiatrist.Eye exam is current.    Review of systems  Constitutional: + increasing body weight,  current Body mass index is 32.52 kg/m. , no fatigue, no subjective hyperthermia, no subjective hypothermia Eyes: no blurry vision, no xerophthalmia ENT: no sore throat, no nodules palpated in throat, no dysphagia/odynophagia, no hoarseness Cardiovascular: no chest pain, no shortness of breath, no palpitations, no leg swelling Respiratory: no cough, no shortness of breath Gastrointestinal: no nausea/vomiting/diarrhea Musculoskeletal: no muscle/joint aches Skin: no rashes, no hyperemia Neurological: no tremors, no numbness, no tingling, no dizziness Psychiatric: no depression, no anxiety  Objective:     BP 130/80 (BP Location: Right Arm, Patient Position: Sitting, Cuff Size: Large)   Pulse 74   Ht 6' (1.829 m)   Wt 239 lb 12.8 oz (108.8 kg)   BMI 32.52 kg/m   Wt Readings from Last 3 Encounters:  02/01/24 239 lb 12.8 oz (108.8 kg)  01/19/24 236 lb (107 kg)  01/13/24 236 lb 11.2 oz (107.4 kg)     BP Readings from Last 3 Encounters:  02/01/24 130/80  01/19/24 118/64  01/13/24 (!) 154/86      Physical Exam- Limited  Constitutional:  Body mass index is 32.52 kg/m. , not in acute distress,  normal state of mind Eyes:  EOMI, no exophthalmos Musculoskeletal: no gross deformities, strength intact in all four extremities, no gross restriction of joint movements Skin:  no rashes, no hyperemia Neurological: no tremor with outstretched hands   Diabetic Foot Exam - Simple   Simple Foot Form Diabetic Foot exam was performed with the following findings: Yes 02/01/2024 11:41 AM  Visual Inspection No deformities, no ulcerations, no other skin breakdown bilaterally: Yes Sensation Testing Intact to touch and monofilament testing bilaterally: Yes Pulse Check Posterior Tibialis and Dorsalis pulse intact bilaterally: Yes Comments     CMP ( most recent) CMP     Component Value Date/Time   NA 135 01/28/2024 0915   NA 140 07/21/2023 1354   K 4.7 01/28/2024 0915   CL 104 01/28/2024 0915   CO2 23 01/28/2024 0915   GLUCOSE 201 (H) 01/28/2024 0915   GLUCOSE 107 (H) 07/09/2006 1049   BUN 16 01/28/2024 0915   BUN 15 07/21/2023 1354   CREATININE 1.05 01/28/2024 0915   CALCIUM 9.3 01/28/2024 0915   PROT 7.1 01/19/2024 1050   PROT 6.6 07/21/2023 1354   ALBUMIN 4.4 01/19/2024 1050   ALBUMIN 4.2 07/21/2023 1354   AST 34 01/19/2024 1050   ALT 38 01/19/2024 1050   ALKPHOS 59 01/19/2024 1050   BILITOT 0.8 01/19/2024 1050   BILITOT 0.6 07/21/2023 1354   GFRNONAA >60 01/28/2024 0915   GFRAA >60 10/26/2015 1936     Diabetic Labs (most recent): Lab Results  Component Value Date   HGBA1C 8.2 (A) 02/01/2024   HGBA1C 8.2 (A) 11/04/2023   HGBA1C 7.8 (A) 07/27/2023   MICROALBUR 30 mg/L 04/20/2023   MICROALBUR <0.7 04/01/2018   MICROALBUR <0.7 03/05/2017     Lipid Panel ( most recent) Lipid Panel     Component Value Date/Time   CHOL 151 08/12/2022 1502   TRIG 211 (H) 08/12/2022 1502   TRIG 96 07/09/2006 1049   HDL  55 08/12/2022 1502   CHOLHDL 2.7 08/12/2022 1502   VLDL 42 (H) 08/12/2022 1502   LDLCALC 54 08/12/2022 1502   LDLDIRECT 57.0 04/01/2018 1408      Lab Results   Component Value Date   TSH 2.100 06/17/2023   TSH 1.18 07/18/2021   TSH 1.27 01/26/2020   TSH 2.34 04/01/2018   TSH 2.14 03/05/2017   TSH 1.27 02/10/2016   TSH 1.35 11/02/2014   TSH 1.64 05/19/2013   TSH 1.21 12/16/2011   TSH 1.27 08/01/2010   FREET4 0.70 07/18/2021           Assessment & Plan:   1) Type 2 Diabetes mellitus without complication without long-term current use of insulin   He presents today, accompanied by his wife, with his logs showing above target fasting glycemic profile.  His POCT A1c today is 8.2%, unchanged from last visit.  He increased his Glipizide  on his own, now taking 5 mg XL twice daily.  He has seen GI specialist since last visit and is being evaluated for MAFLD.  Wife reports his diet continues to be very poor, high in concentrated sweets.  [name] [name] has currently uncontrolled symptomatic type 2 DM since 68 years of age.   -Recent labs reviewed.   - I had a long discussion with him about the progressive nature of diabetes and the pathology behind its complications. -his diabetes is complicated by CAD with stents, CVA and he remains at a high risk for more acute and chronic complications which include CAD, CVA, CKD, retinopathy, and neuropathy. These are all discussed in detail with him.  The following Lifestyle Medicine recommendations according to American College of Lifestyle Medicine [facility]) were discussed and offered to patient and he agrees to start the journey:  A. Whole Foods, Plant-based plate comprising of fruits and vegetables, plant-based proteins, whole-grain carbohydrates was discussed in detail with the patient.   A list for source of those nutrients were also provided to the patient.  Patient will use only water  or unsweetened tea for hydration. B.  The need to stay away from risky substances including alcohol, smoking; obtaining 7 to 9 hours of restorative sleep, at least 150 minutes of moderate intensity exercise weekly, the  importance of healthy social connections,  and stress reduction techniques were discussed. C.  A full color page of  Calorie density of various food groups per pound showing examples of each food groups was provided to the patient.  - Nutritional counseling repeated at each appointment due to patients tendency to fall back in to old habits.  - The patient admits there is a room for improvement in their diet and drink choices. -  Suggestion is made for the patient to avoid simple carbohydrates from their diet including Cakes, Sweet Desserts / Pastries, Ice Cream, Soda (diet and regular), Sweet Tea, Candies, Chips, Cookies, Sweet Pastries, Store Bought Juices, Alcohol in Excess of 1-2 drinks a day, Artificial Sweeteners, Coffee Creamer, and "Sugar-free" Products. This will help patient to have stable blood glucose profile and potentially avoid unintended weight gain.   - I encouraged the patient to switch to unprocessed or minimally processed complex starch and increased protein intake (animal or plant source), fruits, and vegetables.   - Patient is advised to stick to a routine mealtimes to eat 3 meals a day and avoid unnecessary snacks (to snack only to correct hypoglycemia).  - I have approached him with the following individualized plan to manage his diabetes and patient agrees:   -  He is advised to continue Metformin  500 md XR twice daily with meal and will try switching him to Mounjaro 7.5 mg SQ weekly which tends to have more potent weight loss benefits and may help with his liver issues as well.  I advised him to stop the Glipizide  temporarily.  I asked he send readings in 2-3 weeks to reassess.  We did talk about the potential of adding basal insulin  if we cannot regain control on this new regimen.  -he is encouraged to continue monitoring glucose twice daily, before breakfast and before bed, to call the clinic if he has readings less than 70 or above 200 for 3 tests in a row.  - Adjustment  parameters are given to him for hypo and hyperglycemia in writing.  - his Actos , Parlodel , and Jardiance  was previously discontinued, risk outweighs benefit for this patient.  He does not have significant CKD or CHF.  - Specific targets for  A1c; LDL, HDL, and Triglycerides were discussed with the patient.  2) Blood Pressure /Hypertension:  his blood pressure is controlled to target without the use of antihypertensive medications.     3) Lipids/Hyperlipidemia:    Review of his recent lipid panel from 08/12/22 showed controlled LDL at 54 and elevated triglycerides of 211.  he is advised to continue Simvastatin  40 mg daily at bedtime.  Side effects and precautions discussed with him.  He has annual labs coming up with PCP.  I asked they bring copy of results to the office so we can enter into the system.  4)  Weight/Diet:  his Body mass index is 32.52 kg/m.  -  clearly complicating his diabetes care.   he is a candidate for weight loss. I discussed with him the fact that loss of 5 - 10% of his  current body weight will have the most impact on his diabetes management.  Exercise, and detailed carbohydrates information provided  -  detailed on discharge instructions.  5) Chronic Care/Health Maintenance: -he is not on ACEI/ARB and is on Statin medications and is encouraged to initiate and continue to follow up with Ophthalmology, Dentist, Podiatrist at least yearly or according to recommendations, and advised to stay away from smoking. I have recommended yearly flu vaccine and pneumonia vaccine at least every 5 years; moderate intensity exercise for up to 150 minutes weekly; and sleep for at least 7 hours a day.  - he is advised to maintain close follow up with Lauran Pollard, MD for primary care needs, as well as his other providers for optimal and coordinated care.     I spent  42  minutes in the care of the patient today including review of labs from CMP, Lipids, Thyroid  Function, Hematology  (current and previous including abstractions from other facilities); face-to-face time discussing  his blood glucose readings/logs, discussing hypoglycemia and hyperglycemia episodes and symptoms, medications doses, his options of short and long term treatment based on the latest standards of care / guidelines;  discussion about incorporating lifestyle medicine;  and documenting the encounter. Risk reduction counseling performed per USPSTF guidelines to reduce obesity and cardiovascular risk factors.     Please refer to Patient Instructions for Blood Glucose Monitoring and Insulin /Medications Dosing Guide"  in media tab for additional information. Please  also refer to " Patient Self Inventory" in the Media  tab for reviewed elements of pertinent patient history.  [name] [name] participated in the discussions, expressed understanding, and voiced agreement with the above plans.  All  questions were answered to his satisfaction. he is encouraged to contact clinic should he have any questions or concerns prior to his return visit.     Follow up plan: - Return in about 3 months (around 05/03/2024) for Diabetes F/U with A1c in office, No previsit labs, Bring meter and logs.   Hulon Magic, [facility] [address] [place], [place] [address] Phone: [phone] Fax: [phone]  02/01/2024, 11:44 AM

## 2024-02-02 ENCOUNTER — Ambulatory Visit (HOSPITAL_COMMUNITY)

## 2024-02-02 ENCOUNTER — Ambulatory Visit (HOSPITAL_COMMUNITY)
Admission: RE | Admit: 2024-02-02 | Discharge: 2024-02-02 | Disposition: A | Discharge status: Home / Self Care | Source: Ambulatory Visit | Attending: Internal Medicine | Admitting: Internal Medicine

## 2024-02-02 DIAGNOSIS — Z944 Liver transplant status: Secondary | ICD-10-CM | POA: Diagnosis not present

## 2024-02-02 DIAGNOSIS — K759 Inflammatory liver disease, unspecified: Secondary | ICD-10-CM | POA: Diagnosis not present

## 2024-02-02 DIAGNOSIS — K709 Alcoholic liver disease, unspecified: Secondary | ICD-10-CM | POA: Diagnosis not present

## 2024-02-02 DIAGNOSIS — K760 Fatty (change of) liver, not elsewhere classified: Secondary | ICD-10-CM | POA: Insufficient documentation

## 2024-02-02 DIAGNOSIS — R932 Abnormal findings on diagnostic imaging of liver and biliary tract: Secondary | ICD-10-CM | POA: Diagnosis not present

## 2024-02-02 DIAGNOSIS — R748 Abnormal levels of other serum enzymes: Secondary | ICD-10-CM | POA: Diagnosis not present

## 2024-02-04 ENCOUNTER — Telehealth: Payer: Self-pay | Admitting: Internal Medicine

## 2024-02-04 DIAGNOSIS — R1013 Epigastric pain: Secondary | ICD-10-CM

## 2024-02-04 NOTE — Telephone Encounter (Signed)
 I gave [name] [name] the U/S report and have put in a reminder to contact him in a year to repeat it. He is still having the epigastric pain and wants to proceed with the EGD. He set up an EGD for 02/09/2024 at 9:00AM. He will get his instructions thru [facility] and he will sign  the paperwork the when he comes.

## 2024-02-04 NOTE — Telephone Encounter (Signed)
 Patient is returning [employer] call in regards to US  results.

## 2024-02-08 NOTE — Progress Notes (Unsigned)
 [facility] History and Physical   Primary Care Physician:  Lauran Pollard, MD   Reason for Procedure:   Epigastric pain  Plan:    EGD     HPI: [name] [name] is a 68 y.o. male w/ MAFLD, has epigastric pain, US  w/ fatty liver but no gallstones.   Past Medical History:  Diagnosis Date   Arthritis    Cholelithiasis    Coronary atherosclerosis of native coronary artery    a. s/p DES to RCA in 2005 b. 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   Type 2 diabetes mellitus (HCC)    Urolithiasis     Past Surgical History:  Procedure Laterality Date   Colonoscopy     COLONOSCOPY     CORONARY STENT INTERVENTION N/A 04/16/2022   Procedure: CORONARY STENT INTERVENTION;  Surgeon: Swaziland, Peter M, MD;  Location: [facility] INVASIVE CV LAB;  Service: Cardiovascular;  Laterality: N/A;   CORONARY STENT PLACEMENT  2005   CYSTOSCOPY W/ URETERAL STENT PLACEMENT Right 06/22/2023   Procedure: CYSTOSCOPY WITH RETROGRADE PYELOGRAM/URETERAL STENT PLACEMENT;  Surgeon: Marco Severs, MD;  Location: [facility] OR[facility];  Service: Urology;  Laterality: Right;   LEFT HEART CATH AND CORONARY ANGIOGRAPHY N/A 04/16/2022   Procedure: LEFT HEART CATH AND CORONARY ANGIOGRAPHY;  Surgeon: Swaziland, Peter M, MD;  Location: [facility] INVASIVE CV LAB;  Service: Cardiovascular;  Laterality: N/A;   STONE EXTRACTION WITH BASKET Right 06/22/2023   Procedure: STONE EXTRACTION WITH BASKET;  Surgeon: Marco Severs, MD;  Location: [facility] ORS;  Service: Urology;  Laterality: Right;   Testicular tumor resection     Right side    Prior to Admission medications   Medication Sig Start Date End Date Taking? Authorizing Provider  Accu-Chek Softclix Lancets lancets USE EVERY MORNING AND AT BEDTIME  11/24/23   Wendel Hals, NP  aspirin  EC 81 MG tablet Take 1 tablet (81 mg total) by mouth daily. 05/03/19   Gerard Knight, MD  Blood Glucose Monitoring Suppl [facility] VERIO) w/Device KIT Use to test BG qid. E11.65 10/13/22   Wendel Hals, NP  fish oil-omega-3 fatty acids 1000 MG capsule Take 1 g by mouth 2 (two) times daily.    [provider]  losartan  (COZAAR ) 25 MG tablet Take 1 tablet (25 mg total) by mouth daily. 01/13/24 04/12/24  Gerard Knight, MD  metFORMIN  (GLUCOPHAGE -XR) 500 MG 24 hr tablet Take 1 tablet (500 mg total) by mouth 2 (two) times daily with a meal. 02/01/24   Reardon, Arminda Landmark, NP  nitroGLYCERIN  (NITROSTAT ) 0.4 MG SL tablet DISSOLVE 1 TABLET UNDER THE TONGUE EVERY 5 MINUTES FOR 3 DOSES AS NEEDED. MAY REPEAT EVERY 5 MINUTES FOR UP TO 3 DOSES. 10/13/22   Gerard Knight, MD  NON FORMULARY Omega XL twice daily    [provider]  pantoprazole  (PROTONIX ) 40 MG tablet TAKE 1 TABLET(40 MG) BY MOUTH DAILY 10/26/23   Wendel Hals, NP  simvastatin  (ZOCOR ) 40 MG tablet TAKE 1 TABLET(40 MG) BY MOUTH EVERY EVENING 07/13/23   Gerard Knight, MD  tirzepatide [facility]) 7.5 MG/0.5ML Pen Inject 7.5 mg into the skin once a week. 02/01/24   Wendel Hals, NP    Current Outpatient Medications  Medication Sig Dispense Refill   aspirin  EC 81 MG tablet Take 1 tablet (81 mg total) by mouth daily. 90 tablet 3   Blood Glucose Monitoring Suppl (ONETOUCH VERIO) w/Device KIT Use to test BG qid. E11.65 1 kit 0   fish oil-omega-3 fatty acids 1000 MG capsule Take 1 g by mouth 2 (two) times daily.     losartan  (COZAAR ) 25 MG tablet Take 1 tablet (25 mg total) by mouth daily. 90 tablet 3   metFORMIN  (GLUCOPHAGE -XR) 500 MG 24 hr tablet Take 1 tablet (500 mg total) by mouth 2 (two) times daily with a meal. 180 tablet 0   NON FORMULARY Omega XL twice daily     pantoprazole  (PROTONIX ) 40 MG tablet TAKE 1 TABLET(40 MG) BY MOUTH DAILY 90 tablet 0   simvastatin   (ZOCOR ) 40 MG tablet TAKE 1 TABLET(40 MG) BY MOUTH EVERY EVENING 90 tablet 3   Accu-Chek Softclix Lancets lancets USE EVERY MORNING AND AT BEDTIME 100 each 3   nitroGLYCERIN  (NITROSTAT ) 0.4 MG SL tablet DISSOLVE 1 TABLET UNDER THE TONGUE EVERY 5 MINUTES FOR 3 DOSES AS NEEDED. MAY REPEAT EVERY 5 MINUTES FOR UP TO 3 DOSES. (Patient not taking: Reported on 02/09/2024) 25 tablet 3   tirzepatide (MOUNJARO) 7.5 MG/0.5ML Pen Inject 7.5 mg into the skin once a week. 6 mL 1   Current Facility-Administered Medications  Medication Dose Route Frequency Provider Last Rate Last Admin   0.9 %  sodium chloride  infusion  500 mL Intravenous Once Kenney Peacemaker, MD        Allergies as of 02/09/2024   (No Known Allergies)    Family History  Problem Relation Age of Onset   Heart disease Father    Diabetes Father    Heart attack Father    Colon cancer Neg Hx    Esophageal cancer Neg Hx    Rectal cancer Neg Hx    Stomach cancer Neg Hx    Colon polyps Neg Hx     Social History   Socioeconomic History   Marital status: Married    Spouse name: [name]   Number of children: 0   Years of education: Not on file   Highest education level: Not on file  Occupational History   Occupation: Truck driver  Tobacco Use   Smoking status: Former    Current packs/day: 0.00    Average packs/day: 2.0 packs/day for 43.0 years (86.0 ttl pk-yrs)    Types: Cigarettes    Start date: 04/22/1967    Quit date: 04/21/2010    Years since quitting: 13.8   Smokeless tobacco: Never  Vaping Use   Vaping status: Never Used  Substance and Sexual Activity   Alcohol use: Yes    Comment: occasionally   Drug use: No   Sexual activity: Not on file  Other Topics Concern   Not on file  Social History Narrative   Retired Naval architect   Married   No EtOH or very minimal   No children   Former smoker   No drug use   Social Drivers of Corporate investment banker Strain: Not on file  Food Insecurity: No Food Insecurity  (06/22/2023)   Hunger Vital Sign    Worried About Running Out of Food in the Last Year: Never true    Ran Out of Food in the Last Year: Never true  Transportation Needs: No Transportation Needs (06/22/2023)   PRAPARE - Administrator, Civil Service (Medical): No  Lack of Transportation (Non-Medical): No  Physical Activity: Not on file  Stress: Not on file  Social Connections: Not on file  Intimate Partner Violence: Not At Risk (06/22/2023)   Humiliation, Afraid, Rape, and Kick questionnaire    Fear of Current or Ex-Partner: No    Emotionally Abused: No    Physically Abused: No    Sexually Abused: No    Review of Systems:  All other review of systems negative except as mentioned in the HPI.  Physical Exam: Vital signs BP 134/72   Pulse 61   Temp 98.2 F (36.8 C) (Skin)   Resp 18   Ht 6' (1.829 m)   Wt 236 lb (107 kg)   SpO2 97%   BMI 32.01 kg/m   General:   Alert,  Well-developed, well-nourished, pleasant and cooperative in NAD Lungs:  Clear throughout to auscultation.   Heart:  Regular rate and rhythm; no murmurs, clicks, rubs,  or gallops. Abdomen:  Soft, nontender and nondistended. Normal bowel sounds.   Neuro/Psych:  Alert and cooperative. Normal mood and affect. A and O x 3   @Tiffiny Worthy  Tammie Fall, MD, [facility] [phone] (pager) 02/09/2024 10:17 AM@

## 2024-02-09 ENCOUNTER — Ambulatory Visit: Admitting: Internal Medicine

## 2024-02-09 ENCOUNTER — Encounter: Payer: Self-pay | Admitting: Internal Medicine

## 2024-02-09 VITALS — BP 118/69 | HR 66 | Temp 98.2°F | Resp 15 | Ht 72.0 in | Wt 236.0 lb

## 2024-02-09 DIAGNOSIS — K319 Disease of stomach and duodenum, unspecified: Secondary | ICD-10-CM | POA: Diagnosis not present

## 2024-02-09 DIAGNOSIS — R1013 Epigastric pain: Secondary | ICD-10-CM | POA: Diagnosis not present

## 2024-02-09 DIAGNOSIS — K3189 Other diseases of stomach and duodenum: Secondary | ICD-10-CM

## 2024-02-09 DIAGNOSIS — K31819 Angiodysplasia of stomach and duodenum without bleeding: Secondary | ICD-10-CM | POA: Diagnosis not present

## 2024-02-09 DIAGNOSIS — K760 Fatty (change of) liver, not elsewhere classified: Secondary | ICD-10-CM

## 2024-02-09 MED ORDER — SODIUM CHLORIDE 0.9 % IV SOLN: 500.0000 mL | Freq: Once | INTRAVENOUS | Status: DC

## 2024-02-09 NOTE — Patient Instructions (Addendum)
 There are some color changes in the stomach that suggest gastritis and possible infection. Biopsies taken.  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 If they cause problems (bleeding) can be treated with tools available at hospital.  I doubt your pain is related to possible gastritis - sounds more musculoskeletal.  Will contact after biopsies return.  I appreciate the opportunity to care for you. Kenney Peacemaker, MD, [facility]  Resume previous diet Continue present medications Await pathology results.   YOU HAD AN ENDOSCOPIC PROCEDURE TODAY AT THE [facility]:   Refer to the procedure report that was given to you for any specific questions about what was found during the examination.  If the procedure report does not answer your questions, please call your gastroenterologist to clarify.  If you requested that your care partner not be given the details of your procedure findings, then the procedure report has been included in a sealed envelope for you to review at your convenience later.  YOU SHOULD EXPECT: Some feelings of bloating in the abdomen. Passage of more gas than usual.  Walking can help get rid of the air that was put into your GI tract during the procedure and reduce the bloating. If you had a lower endoscopy (such as a colonoscopy or flexible sigmoidoscopy) you may notice spotting of blood in your stool or on the toilet paper. If you underwent a bowel prep for your procedure, you may not have a normal bowel movement for a few days.  Please Note:  You might notice some irritation and congestion in your nose or some drainage.  This is from the oxygen used during your procedure.  There is no need for concern and it should clear up in a day or so.  SYMPTOMS TO REPORT IMMEDIATELY:  Following upper endoscopy (EGD)  Vomiting of blood or coffee ground material  New chest pain or pain under the shoulder blades  Painful or  persistently difficult swallowing  New shortness of breath  Fever of 100F or higher  Black, tarry-looking stools  For urgent or emergent issues, a gastroenterologist can be reached at any hour by calling [phone]. Do not use MyChart messaging for urgent concerns.   DIET:  We do recommend a small meal at first, but then you may proceed to your regular diet.  Drink plenty of fluids but you should avoid alcoholic beverages for 24 hours.  ACTIVITY:  You should plan to take it easy for the rest of today and you should NOT DRIVE or use heavy machinery until tomorrow (because of the sedation medicines used during the test).    FOLLOW UP: Our staff will call the number listed on your records the next business day following your procedure.  We will call around 7:15- 8:00 am to check on you and address any questions or concerns that you may have regarding the information given to you following your procedure. If we do not reach you, we will leave a message.     If any biopsies were taken you will be contacted by phone or by letter within the next 1-3 weeks.  Please call us  at [phone] if you have not heard about the biopsies in 3 weeks.   SIGNATURES/CONFIDENTIALITY: You and/or your care partner have signed paperwork which will be entered into your electronic medical record.  These signatures attest to the fact that that the information above on your After Visit Summary has been  reviewed and is understood.  Full responsibility of the confidentiality of this discharge information lies with you and/or your care-partner.

## 2024-02-09 NOTE — Progress Notes (Signed)
0950 Robinul 0.1 mg IV given due large amount of secretions upon assessment.  MD made aware, vss  

## 2024-02-09 NOTE — Progress Notes (Signed)
 Pt's states no medical or surgical changes since previsit or office visit.

## 2024-02-09 NOTE — Op Note (Signed)
 [facility] Patient Name: [name] [name] Procedure Date: 02/09/2024 10:13 AM MRN: [id number] Endoscopist: Kenney Peacemaker , MD, [id number] Age: 68 Referring MD:  Date of Birth: 01-Sep-1956 Gender: Male Account #: [id number] Procedure:                Upper GI endoscopy Indications:              Epigastric abdominal pain/lower chest pain Medicines:                Monitored Anesthesia Care Procedure:                Pre-Anesthesia Assessment:                           - Prior to the procedure, a History and Physical                            was performed, and patient medications and                            allergies were reviewed. The patient's tolerance of                            previous anesthesia was also reviewed. The risks                            and benefits of the procedure and the sedation                            options and risks were discussed with the patient.                            All questions were answered, and informed consent                            was obtained. Prior Anticoagulants: The patient has                            taken no anticoagulant or antiplatelet agents. ASA                            Grade Assessment: III - A patient with severe                            systemic disease. After reviewing the risks and                            benefits, the patient was deemed in satisfactory                            condition to undergo the procedure.                           After obtaining informed consent, the endoscope was  passed under direct vision. Throughout the                            procedure, the patient's blood pressure, pulse, and                            oxygen saturations were monitored continuously. The                            Olympus scope [id number] was introduced through the                            mouth, and advanced to the second part of duodenum.                            The  upper GI endoscopy was accomplished without                            difficulty. The patient tolerated the procedure                            well. Scope In: Scope Out: Findings:                 Patchy moderately erythematous mucosa without                            bleeding was found in the gastric antrum. Biopsies                            were taken with a cold forceps for histology.                            Verification of patient identification for the                            specimen was done. Estimated blood loss was minimal.                           Two 1 to 3 mm angiodysplastic lesions without                            bleeding were found in the duodenal bulb.                           The exam was otherwise without abnormality. 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                           The cardia and gastric fundus were otherwise  normal                            on retroflexion. Complications:            No immediate complications. Estimated blood loss:                            Minimal. Estimated Blood Loss:     Estimated blood loss was minimal. Impression:               - Erythematous mucosa in the antrum. Biopsied.                           - Two non-bleeding angiodysplastic lesions in the                            duodenum.                           - The examination was otherwise normal. Recommendation:           - Patient has a contact number available for                            emergencies. The signs and symptoms of potential                            delayed complications were discussed with the                            patient. Return to normal activities tomorrow.                            Written discharge instructions were provided to the                            patient.                           - Resume previous diet.                           -  Continue present medications.                           - Await pathology results. I doubt his symptoms are                            related to any findings here. He has high                            epigastric and lower chest pain that is worse with                            certain positions when sleeping, etc. Suspect at  least in part if not all musculoskeletal. Kenney Peacemaker, MD 02/09/2024 10:41:35 AM This report has been signed electronically.

## 2024-02-09 NOTE — Progress Notes (Signed)
 Report given to PACU, vss

## 2024-02-09 NOTE — Progress Notes (Signed)
 Called to room to assist during endoscopic procedure.  Patient ID and intended procedure confirmed with present staff. Received instructions for my participation in the procedure from the performing physician.

## 2024-02-10 ENCOUNTER — Telehealth: Payer: Self-pay | Admitting: *Deleted

## 2024-02-10 NOTE — Telephone Encounter (Signed)
  Follow up Call-     02/09/2024    9:31 AM 04/07/2023    8:43 AM  Call back number  Post procedure Call Back phone  # [phone]  Permission to leave phone message Yes Yes     Patient questions:  Do you have a fever, pain , or abdominal swelling? No. Pain Score  0 *  Have you tolerated food without any problems? Yes.    Have you been able to return to your normal activities? Yes.    Do you have any questions about your discharge instructions: Diet   No. Medications  No. Follow up visit  No.  Do you have questions or concerns about your Care? No.  Actions: * If pain score is 4 or above: No action needed, pain <4.

## 2024-02-11 ENCOUNTER — Other Ambulatory Visit (HOSPITAL_COMMUNITY): Payer: Self-pay

## 2024-02-11 ENCOUNTER — Telehealth: Payer: Self-pay | Admitting: *Deleted

## 2024-02-11 ENCOUNTER — Telehealth: Payer: Self-pay

## 2024-02-11 NOTE — Telephone Encounter (Signed)
 Pharmacy Patient Advocate Encounter   Received notification from Pt Calls Messages that prior authorization for Mounjaro is required/requested.   Insurance verification completed.   The patient is insured through [facility] .   Per test claim: PA not required. Test claim in rejecting stating "DUPLICATE USE: [id number]".   They are not letting him get it because he picked up a 3 month supply of Ozempic  in April and they don't want him using both.

## 2024-02-11 NOTE — Telephone Encounter (Signed)
 Patient's wife called and states that the insurance will not cover Mounjaro and may need to have a PA. We have not received a PA as of yet that we can see, will send to the PA team to take a look into.

## 2024-02-15 LAB — SURGICAL PATHOLOGY

## 2024-02-15 NOTE — Telephone Encounter (Signed)
 Ok, I guess he can finish up the Ozempic  and we can re-evaluate at that time if he should switch to the Mounjaro.

## 2024-02-15 NOTE — Telephone Encounter (Signed)
 Patient was called and made aware, [name] [name] denied because he just picked up a 3 month supply of the Ozempic  in April , they do not want him duplicating medication. He will complete his 3 month supply of the Ozempic  , then a script be resubmitted for the Mounjaro. Patient denies any issues with the Ozempic .

## 2024-02-15 NOTE — Telephone Encounter (Signed)
 Noted

## 2024-02-16 ENCOUNTER — Ambulatory Visit (HOSPITAL_COMMUNITY)
Admission: RE | Admit: 2024-02-16 | Discharge: 2024-02-16 | Disposition: A | Discharge status: Home / Self Care | Payer: Medicare Other | Source: Ambulatory Visit | Attending: Urology | Admitting: Urology

## 2024-02-16 DIAGNOSIS — N200 Calculus of kidney: Secondary | ICD-10-CM | POA: Diagnosis not present

## 2024-02-16 DIAGNOSIS — N2889 Other specified disorders of kidney and ureter: Secondary | ICD-10-CM | POA: Diagnosis not present

## 2024-02-16 DIAGNOSIS — N281 Cyst of kidney, acquired: Secondary | ICD-10-CM | POA: Diagnosis not present

## 2024-02-16 DIAGNOSIS — N261 Atrophy of kidney (terminal): Secondary | ICD-10-CM | POA: Diagnosis not present

## 2024-02-17 ENCOUNTER — Encounter: Payer: Self-pay | Admitting: Nurse Practitioner

## 2024-02-18 ENCOUNTER — Ambulatory Visit: Payer: Self-pay | Admitting: Internal Medicine

## 2024-02-23 ENCOUNTER — Ambulatory Visit: Admitting: Urology

## 2024-02-23 ENCOUNTER — Encounter: Payer: Self-pay | Admitting: Urology

## 2024-02-23 ENCOUNTER — Ambulatory Visit: Payer: Medicare Other | Admitting: Urology

## 2024-02-23 VITALS — BP 150/75 | HR 83

## 2024-02-23 DIAGNOSIS — N200 Calculus of kidney: Secondary | ICD-10-CM

## 2024-02-23 NOTE — Progress Notes (Signed)
 02/23/2024 8:45 AM   [name] [name] 07-25-1956 [id number]  Referring provider: Leesa Pulling, MD [address] [place],  [place] [address]  Followup nephrolithiasis   HPI: [name] is a 68WU here for followup for nephrolithiasis. No stone events since last visit. Renal SU shows bilateral renal cysts but no stones and no hydronephrosis. He denies any significant LUTS. Urine stream strong. No hematuria or dysuria. He drinks 16oz of water  daily and multiple sodas and tea.    PMH: Past Medical History:  Diagnosis Date   Arthritis    Cholelithiasis    Coronary atherosclerosis of native coronary artery    a. s/p DES to RCA in 2005 b. 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   Type 2 diabetes mellitus (HCC)    Urolithiasis     Surgical History: Past Surgical History:  Procedure Laterality Date   Colonoscopy     COLONOSCOPY     CORONARY STENT INTERVENTION N/A 04/16/2022   Procedure: CORONARY STENT INTERVENTION;  Surgeon: Swaziland, Peter M, MD;  Location: [facility];  Service: Cardiovascular;  Laterality: N/A;   CORONARY STENT PLACEMENT  2005   CYSTOSCOPY W/ URETERAL STENT PLACEMENT Right 06/22/2023   Procedure: CYSTOSCOPY WITH RETROGRADE PYELOGRAM/URETERAL STENT PLACEMENT;  Surgeon: Marco Severs, MD;  Location: [facility];  Service: Urology;  Laterality: Right;   LEFT HEART CATH AND CORONARY ANGIOGRAPHY N/A 04/16/2022   Procedure: LEFT HEART CATH AND CORONARY ANGIOGRAPHY;  Surgeon: Swaziland, Peter M, MD;  Location: [facility] CV [facility];  Service: Cardiovascular;  Laterality: N/A;   STONE EXTRACTION WITH BASKET Right 06/22/2023   Procedure: STONE EXTRACTION WITH BASKET;  Surgeon: Marco Severs, MD;  Location: [facility];  Service: Urology;   Laterality: Right;   Testicular tumor resection     Right side    Home Medications:  Allergies as of 02/23/2024   No Known Allergies      Medication List        Accurate as of Feb 23, 2024  8:45 AM. If you have any questions, ask your nurse or doctor.          Accu-Chek Softclix Lancets lancets USE EVERY MORNING AND AT BEDTIME   aspirin  EC 81 MG tablet Take 1 tablet (81 mg total) by mouth daily.   fish oil-omega-3 fatty acids 1000 MG capsule Take 1 g by mouth 2 (two) times daily.   losartan  25 MG tablet Commonly known as: COZAAR  Take 1 tablet (25 mg total) by mouth daily.   metFORMIN  500 MG 24 hr tablet Commonly known as: GLUCOPHAGE -XR Take 1 tablet (500 mg total) by mouth 2 (two) times daily with a meal.   nitroGLYCERIN  0.4 MG SL tablet Commonly known as: NITROSTAT  DISSOLVE 1 TABLET UNDER THE TONGUE EVERY 5 MINUTES FOR 3 DOSES AS NEEDED. MAY REPEAT EVERY 5 MINUTES FOR UP TO 3 DOSES.   NON FORMULARY Omega XL twice daily   OneTouch Verio w/Device Kit Use to test BG qid. E11.65   pantoprazole  40 MG tablet Commonly known as: PROTONIX  TAKE 1 TABLET(40 MG) BY MOUTH DAILY   simvastatin  40 MG tablet Commonly known as: ZOCOR  TAKE 1 TABLET(40 MG) BY MOUTH EVERY EVENING   tirzepatide 7.5 MG/0.5ML Pen  Commonly known as: MOUNJARO Inject 7.5 mg into the skin once a week.        Allergies: No Known Allergies  Family History: Family History  Problem Relation Age of Onset   Heart disease Father    Diabetes Father    Heart attack Father    Colon cancer Neg Hx    Esophageal cancer Neg Hx    Rectal cancer Neg Hx    Stomach cancer Neg Hx    Colon polyps Neg Hx     Social History:  reports that he quit smoking about 13 years ago. His smoking use included cigarettes. He started smoking about 56 years ago. He has a 86 pack-year smoking history. He has never used smokeless tobacco. He reports current alcohol use. He reports that he does not use  drugs.  ROS: All other review of systems were reviewed and are negative except what is noted above in HPI  Physical Exam: BP (!) 150/75   Pulse 83   Constitutional:  Alert and oriented, No acute distress. HEENT: [place] AT, moist mucus membranes.  Trachea midline, no masses. Cardiovascular: No clubbing, cyanosis, or edema. Respiratory: Normal respiratory effort, no increased work of breathing. GI: Abdomen is soft, nontender, nondistended, no abdominal masses GU: No CVA tenderness.  Lymph: No cervical or inguinal lymphadenopathy. Skin: No rashes, bruises or suspicious lesions. Neurologic: Grossly intact, no focal deficits, moving all 4 extremities. Psychiatric: Normal mood and affect.  Laboratory Data: Lab Results  Component Value Date   WBC 6.8 01/19/2024   HGB 14.9 01/19/2024   HCT 43.5 01/19/2024   MCV 85.9 01/19/2024   PLT 140.0 (L) 01/19/2024    Lab Results  Component Value Date   CREATININE 1.05 01/28/2024    Lab Results  Component Value Date   PSA 0.30 07/18/2021   PSA 0.29 04/01/2018   PSA 0.22 03/05/2017    Lab Results  Component Value Date   TESTOSTERONE  295.05 (L) 12/16/2011    Lab Results  Component Value Date   HGBA1C 8.2 (A) 02/01/2024    Urinalysis    Component Value Date/Time   COLORURINE BROWN (A) 06/22/2023 0346   APPEARANCEUR TURBID (A) 06/22/2023 0346   LABSPEC 1.020 06/22/2023 0346   PHURINE 6.5 06/22/2023 0346   GLUCOSEU 100 (A) 06/22/2023 0346   GLUCOSEU >=1000 (A) 04/01/2018 1516   HGBUR LARGE (A) 06/22/2023 0346   BILIRUBINUR MODERATE (A) 06/22/2023 0346   KETONESUR TRACE (A) 06/22/2023 0346   PROTEINUR 100 (A) 06/22/2023 0346   UROBILINOGEN 0.2 04/01/2018 1516   NITRITE POSITIVE (A) 06/22/2023 0346   LEUKOCYTESUR SMALL (A) 06/22/2023 0346    Lab Results  Component Value Date   MUCUS Presence of 05/19/2013   BACTERIA RARE (A) 06/22/2023    Pertinent Imaging: Renal IS 02/16/24: Images reviewed and discussed with the  patient No results found for this or any previous visit.  No results found for this or any previous visit.  No results found for this or any previous visit.  No results found for this or any previous visit.  Results for orders placed during the hospital encounter of 08/16/23  Ultrasound renal complete  Narrative : PROCEDURE: US  RENAL  HISTORY: Patient is a 68 y/o M with nephrolithiasis. Right cystoscopy with ureteral stent placement 05/2023.  COMPARISON: CT abdomen/pelvis 06/21/2023, 03/24/2023.  TECHNIQUE: Two-dimensional grayscale and color Doppler ultrasound of the kidneys was performed.  FINDINGS: The urinary bladder demonstrates normal anechoic echogenicity without wall thickening.  The right kidney measures 11.7  cm. Renal cortical echotexture is normal. There is no hydronephrosis. There are no stones. There are no cysts.  The left kidney measures 10.0 cm. Renal cortical echotexture is increased. There is no hydronephrosis. There are no stones. There are multiple anechoic parapelvic cysts, largest measuring 3.7 cm.  IMPRESSION: 1. Increased echogenicity of the left renal cortex with multiple cysts, suggestive of medical renal disease.  2.  No sonographic evidence of nephrolithiasis.  Thank you for allowing us  to assist in the care of this patient.   Electronically Signed By: Beula Brunswick M.D. On: 08/26/2023 14:30  No results found for this or any previous visit.  No results found for this or any previous visit.  Results for orders placed during the hospital encounter of 06/21/23  CT RENAL STONE STUDY  Narrative CLINICAL DATA:  Right flank pain and vomiting.  EXAM: CT ABDOMEN AND PELVIS WITHOUT CONTRAST  TECHNIQUE: Multidetector CT imaging of the abdomen and pelvis was performed following the standard protocol without IV contrast.  RADIATION DOSE REDUCTION: This exam was performed according to the departmental dose-optimization program which  includes automated exposure control, adjustment of the mA and/or kV according to patient size and/or use of iterative reconstruction technique.  COMPARISON:  March 24, 2023 and Feb 26, 2015  FINDINGS: Lower chest: Stable, benign 2 mm and 3 mm noncalcified lung nodules are seen along the periphery of the left lung base.  Hepatobiliary: No focal liver abnormality is seen. No gallstones, gallbladder wall thickening, or biliary dilatation.  Pancreas: Unremarkable. No pancreatic ductal dilatation or surrounding inflammatory changes.  Spleen: Normal in size without focal abnormality.  Adrenals/Urinary Tract: Adrenal glands are unremarkable. The left kidney is atrophic in size. Compensatory hypertrophy of the right kidney is noted. A 3.1 cm cyst is seen along the posterior aspect of the mid left kidney. A 2 mm obstructing calculus is seen within the distal right ureter, with mild right-sided hydronephrosis, hydroureter and perinephric inflammatory fat stranding. There is mild diffuse urinary bladder wall thickening.  Stomach/Bowel: Stomach is within normal limits. The appendix is not identified. No evidence of bowel wall thickening, distention, or inflammatory changes.  Vascular/Lymphatic: Aortic atherosclerosis with stable 2.7 cm diameter dilatation of the distal abdominal aorta. No enlarged abdominal or pelvic lymph nodes.  Reproductive: Prostate is unremarkable.  Other: No abdominal wall hernia or abnormality. No abdominopelvic ascites.  Musculoskeletal: No acute or significant osseous findings.  IMPRESSION: 1. 2 mm obstructing calculus within the distal right ureter. 2. Atrophic left kidney with compensatory hypertrophy of the right kidney. 3. 3.1 cm left renal cyst. No follow-up imaging is recommended. This recommendation follows ACR consensus guidelines: Management of the Incidental Renal Mass on CT: A White Paper of the ACR Incidental Findings Committee. J Am Coll  Radiol [phone]. 4. Aortic atherosclerosis with stable 2.7 cm diameter dilatation of the distal abdominal aorta. Recommend follow-up ultrasound every 5 years.  Aortic Atherosclerosis (ICD10-I70.0).   Electronically Signed By: Virgle Grime M.D. On: 06/22/2023 01:47   Assessment & Plan:    1. Nephrolithiasis (Primary) Increase water  intake Dietary handout taken  - Urinalysis, Routine w reflex microscopic   No follow-ups on file.  [name] Nailer, MD  [facility] [place]

## 2024-02-23 NOTE — Patient Instructions (Signed)

## 2024-03-14 ENCOUNTER — Other Ambulatory Visit: Payer: Self-pay | Admitting: Nurse Practitioner

## 2024-04-09 ENCOUNTER — Other Ambulatory Visit: Payer: Self-pay | Admitting: Nurse Practitioner

## 2024-05-09 ENCOUNTER — Ambulatory Visit: Admitting: Nurse Practitioner

## 2024-05-09 ENCOUNTER — Encounter: Payer: Self-pay | Admitting: Nurse Practitioner

## 2024-05-09 VITALS — BP 116/72 | HR 92 | Ht 72.0 in | Wt 236.0 lb

## 2024-05-09 DIAGNOSIS — Z7984 Long term (current) use of oral hypoglycemic drugs: Secondary | ICD-10-CM | POA: Diagnosis not present

## 2024-05-09 DIAGNOSIS — I10 Essential (primary) hypertension: Secondary | ICD-10-CM | POA: Diagnosis not present

## 2024-05-09 DIAGNOSIS — E1159 Type 2 diabetes mellitus with other circulatory complications: Secondary | ICD-10-CM | POA: Diagnosis not present

## 2024-05-09 DIAGNOSIS — E782 Mixed hyperlipidemia: Secondary | ICD-10-CM

## 2024-05-09 DIAGNOSIS — E119 Type 2 diabetes mellitus without complications: Secondary | ICD-10-CM | POA: Diagnosis not present

## 2024-05-09 DIAGNOSIS — Z1321 Encounter for screening for nutritional disorder: Secondary | ICD-10-CM | POA: Diagnosis not present

## 2024-05-09 DIAGNOSIS — E1151 Type 2 diabetes mellitus with diabetic peripheral angiopathy without gangrene: Secondary | ICD-10-CM

## 2024-05-09 DIAGNOSIS — Z7985 Long-term (current) use of injectable non-insulin antidiabetic drugs: Secondary | ICD-10-CM | POA: Diagnosis not present

## 2024-05-09 DIAGNOSIS — K760 Fatty (change of) liver, not elsewhere classified: Secondary | ICD-10-CM | POA: Diagnosis not present

## 2024-05-09 DIAGNOSIS — R5383 Other fatigue: Secondary | ICD-10-CM | POA: Diagnosis not present

## 2024-05-09 DIAGNOSIS — E7849 Other hyperlipidemia: Secondary | ICD-10-CM | POA: Diagnosis not present

## 2024-05-09 DIAGNOSIS — Z1329 Encounter for screening for other suspected endocrine disorder: Secondary | ICD-10-CM | POA: Diagnosis not present

## 2024-05-09 DIAGNOSIS — D559 Anemia due to enzyme disorder, unspecified: Secondary | ICD-10-CM | POA: Diagnosis not present

## 2024-05-09 LAB — LAB REPORT - SCANNED
A1c: 8.2
EGFR: 63.8
TSH: 1.66 (ref 0.41–5.90)

## 2024-05-09 MED ORDER — TIRZEPATIDE 7.5 MG/0.5ML ~~LOC~~ SOAJ
7.5000 mg | SUBCUTANEOUS | 1 refills | Status: DC
Start: 2024-05-09 — End: 2024-08-15

## 2024-05-09 MED ORDER — METFORMIN HCL ER 500 MG PO TB24: 500.0000 mg | ORAL_TABLET | Freq: Two times a day (BID) | ORAL | 0 refills | Status: DC

## 2024-05-09 MED ORDER — FREESTYLE LIBRE 3 PLUS SENSOR MISC: 3 refills | Status: AC

## 2024-05-09 NOTE — Progress Notes (Signed)
 Endocrinology Follow Up Note       05/09/2024, 11:40 AM   Subjective:    Patient ID: [name] [name], male    DOB: 16-Feb-1956.  [name] [name] is being seen in follow up after being seen in consultation for management of currently uncontrolled symptomatic diabetes requested by  Trudy Vaughn FALCON, MD.   Past Medical History:  Diagnosis Date   Arthritis    Cholelithiasis    Coronary atherosclerosis of native coronary artery    a. s/p DES to RCA in 2005 b. 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   Type 2 diabetes mellitus (HCC)    Urolithiasis     Past Surgical History:  Procedure Laterality Date   Colonoscopy     COLONOSCOPY     CORONARY STENT INTERVENTION N/A 04/16/2022   Procedure: CORONARY STENT INTERVENTION;  Surgeon: Swaziland, Peter M, MD;  Location: [facility] INVASIVE CV LAB;  Service: Cardiovascular;  Laterality: N/A;   CORONARY STENT PLACEMENT  2005   CYSTOSCOPY W/ URETERAL STENT PLACEMENT Right 06/22/2023   Procedure: CYSTOSCOPY WITH RETROGRADE PYELOGRAM/URETERAL STENT PLACEMENT;  Surgeon: Sherrilee Belvie CROME, MD;  Location: [facility];  Service: Urology;  Laterality: Right;   LEFT HEART CATH AND CORONARY ANGIOGRAPHY N/A 04/16/2022   Procedure: LEFT HEART CATH AND CORONARY ANGIOGRAPHY;  Surgeon: Swaziland, Peter M, MD;  Location: [facility] INVASIVE CV LAB;  Service: Cardiovascular;  Laterality: N/A;   STONE EXTRACTION WITH BASKET Right 06/22/2023   Procedure: STONE EXTRACTION WITH BASKET;  Surgeon: Sherrilee Belvie CROME, MD;  Location: [facility];  Service: Urology;  Laterality: Right;   Testicular tumor resection     Right side    Social History   Socioeconomic History   Marital status:  Married    Spouse name: [name]   Number of children: 0   Years of education: Not on file   Highest education level: Not on file  Occupational History   Occupation: Truck driver  Tobacco Use   Smoking status: Former    Current packs/day: 0.00    Average packs/day: 2.0 packs/day for 43.0 years (86.0 ttl pk-yrs)    Types: Cigarettes    Start date: 04/22/1967    Quit date: 04/21/2010    Years since quitting: 14.0   Smokeless tobacco: Never  Vaping Use   Vaping status: Never Used  Substance and Sexual Activity   Alcohol use: Yes    Comment: occasionally   Drug use: No   Sexual activity: Not on file  Other Topics Concern   Not on file  Social History Narrative   Retired Naval architect   Married   No EtOH or very minimal   No children   Former smoker   No drug use   Social Drivers of Corporate investment banker Strain: Not on file  Food Insecurity: No Food Insecurity (06/22/2023)   Hunger Vital Sign    Worried About Running Out of Food  in the Last Year: Never true    Ran Out of Food in the Last Year: Never true  Transportation Needs: No Transportation Needs (06/22/2023)   PRAPARE - Administrator, Civil Service (Medical): No    Lack of Transportation (Non-Medical): No  Physical Activity: Not on file  Stress: Not on file  Social Connections: Not on file    Family History  Problem Relation Age of Onset   Heart disease Father    Diabetes Father    Heart attack Father    Colon cancer Neg Hx    Esophageal cancer Neg Hx    Rectal cancer Neg Hx    Stomach cancer Neg Hx    Colon polyps Neg Hx     Outpatient Encounter Medications as of 05/09/2024  Medication Sig   Accu-Chek Softclix Lancets lancets USE EVERY MORNING AND AT BEDTIME   aspirin  EC 81 MG tablet Take 1 tablet (81 mg total) by mouth daily.   Blood Glucose Monitoring Suppl (ONETOUCH VERIO) w/Device KIT Use to test BG qid. E11.65   Continuous Glucose Sensor (FREESTYLE LIBRE 3 PLUS SENSOR) MISC Change  sensor every 15 days.   fish oil-omega-3 fatty acids 1000 MG capsule Take 1 g by mouth 2 (two) times daily.   losartan  (COZAAR ) 25 MG tablet Take 1 tablet (25 mg total) by mouth daily.   nitroGLYCERIN  (NITROSTAT ) 0.4 MG SL tablet DISSOLVE 1 TABLET UNDER THE TONGUE EVERY 5 MINUTES FOR 3 DOSES AS NEEDED. MAY REPEAT EVERY 5 MINUTES FOR UP TO 3 DOSES.   NON FORMULARY Omega XL twice daily   pantoprazole  (PROTONIX ) 40 MG tablet TAKE 1 TABLET(40 MG) BY MOUTH DAILY   simvastatin  (ZOCOR ) 40 MG tablet TAKE 1 TABLET(40 MG) BY MOUTH EVERY EVENING   [DISCONTINUED] glipiZIDE  (GLUCOTROL  XL) 5 MG 24 hr tablet TAKE 1 TABLET(5 MG) BY MOUTH DAILY WITH BREAKFAST   [DISCONTINUED] metFORMIN  (GLUCOPHAGE -XR) 500 MG 24 hr tablet Take 1 tablet (500 mg total) by mouth 2 (two) times daily with a meal.   [DISCONTINUED] tirzepatide  (MOUNJARO ) 7.5 MG/0.5ML Pen Inject 7.5 mg into the skin once a week.   metFORMIN  (GLUCOPHAGE -XR) 500 MG 24 hr tablet Take 1 tablet (500 mg total) by mouth 2 (two) times daily with a meal.   tirzepatide  (MOUNJARO ) 7.5 MG/0.5ML Pen Inject 7.5 mg into the skin once a week.   No facility-administered encounter medications on file as of 05/09/2024.    ALLERGIES: No Known Allergies  VACCINATION STATUS: Immunization History  Administered Date(s) Administered   Pneumococcal Polysaccharide-23 07/15/2009   Tdap 03/05/2017   Zoster, Live 02/10/2016    Diabetes He presents for his follow-up diabetic visit. He has type 2 diabetes mellitus. Onset time: diagnosed at approx age of 60. His disease course has been stable. There are no hypoglycemic associated symptoms. There are no hypoglycemic complications. Diabetic complications include a CVA and heart disease (CAD). Risk factors for coronary artery disease include diabetes mellitus, dyslipidemia, family history, obesity, male sex, hypertension and sedentary lifestyle. Current diabetic treatment includes oral agent (monotherapy) and diet (Ozempic ). He is  compliant with treatment most of the time. His weight is fluctuating minimally. He is following a generally healthy diet. When asked about meal planning, he reported none. He has not had a previous visit with a dietitian. He rarely participates in exercise. His breakfast blood glucose range is generally 180-200 mg/dl. (He presents today, accompanied by his wife, with his logs showing above target fasting glycemic profile.  His POCT A1c  today is 8.1%, essentially unchanged from last visit.  He just transitioned to the Mounjaro about 3 weeks ago due to insurance issues.  He reports he is tolerating it well.  He denies any s/s of hypoglycemia.) An ACE inhibitor/angiotensin II receptor blocker is not being taken. He sees a podiatrist.Eye exam is current.    Review of systems  Constitutional: +stable body weight,  current Body mass index is 32.01 kg/m. , no fatigue, no subjective hyperthermia, no subjective hypothermia Eyes: no blurry vision, no xerophthalmia ENT: no sore throat, no nodules palpated in throat, no dysphagia/odynophagia, no hoarseness Cardiovascular: no chest pain, no shortness of breath, no palpitations, no leg swelling Respiratory: no cough, no shortness of breath Gastrointestinal: no nausea/vomiting/diarrhea Musculoskeletal: no muscle/joint aches Skin: no rashes, no hyperemia Neurological: no tremors, no numbness, no tingling, no dizziness Psychiatric: no depression, no anxiety  Objective:     BP 116/72 (BP Location: Left Arm, Patient Position: Sitting, Cuff Size: Large)   Pulse 92   Ht 6' (1.829 m)   Wt 236 lb (107 kg)   BMI 32.01 kg/m   Wt Readings from Last 3 Encounters:  05/09/24 236 lb (107 kg)  02/09/24 236 lb (107 kg)  02/01/24 239 lb 12.8 oz (108.8 kg)     BP Readings from Last 3 Encounters:  05/09/24 116/72  02/23/24 (!) 150/75  02/09/24 118/69      Physical Exam- Limited  Constitutional:  Body mass index is 32.01 kg/m. , not in acute distress, normal  state of mind Eyes:  EOMI, no exophthalmos Musculoskeletal: no gross deformities, strength intact in all four extremities, no gross restriction of joint movements Skin:  no rashes, no hyperemia Neurological: no tremor with outstretched hands   Diabetic Foot Exam - Simple   No data filed     CMP ( most recent) CMP     Component Value Date/Time   NA 135 01/28/2024 0915   NA 140 07/21/2023 1354   K 4.7 01/28/2024 0915   CL 104 01/28/2024 0915   CO2 23 01/28/2024 0915   GLUCOSE 201 (H) 01/28/2024 0915   GLUCOSE 107 (H) 07/09/2006 1049   BUN 16 01/28/2024 0915   BUN 15 07/21/2023 1354   CREATININE 1.05 01/28/2024 0915   CALCIUM 9.3 01/28/2024 0915   PROT 7.1 01/19/2024 1050   PROT 6.6 07/21/2023 1354   ALBUMIN 4.4 01/19/2024 1050   ALBUMIN 4.2 07/21/2023 1354   AST 34 01/19/2024 1050   ALT 38 01/19/2024 1050   ALKPHOS 59 01/19/2024 1050   BILITOT 0.8 01/19/2024 1050   BILITOT 0.6 07/21/2023 1354   GFRNONAA >60 01/28/2024 0915   GFRAA >60 10/26/2015 1936     Diabetic Labs (most recent): Lab Results  Component Value Date   HGBA1C 8.2 (A) 02/01/2024   HGBA1C 8.2 (A) 11/04/2023   HGBA1C 7.8 (A) 07/27/2023   MICROALBUR 30 mg/L 04/20/2023   MICROALBUR 1.3 06/13/2009   MICROALBUR 0.2 07/05/2008     Lipid Panel ( most recent) Lipid Panel     Component Value Date/Time   CHOL 151 08/12/2022 1502   TRIG 211 (H) 08/12/2022 1502   TRIG 96 07/09/2006 1049   HDL 55 08/12/2022 1502   CHOLHDL 2.7 08/12/2022 1502   VLDL 42 (H) 08/12/2022 1502   LDLCALC 54 08/12/2022 1502   LDLDIRECT 57.0 04/01/2018 1408      Lab Results  Component Value Date   TSH 2.100 06/17/2023   TSH 1.18 07/18/2021   TSH 1.27 01/26/2020  TSH 2.34 04/01/2018   TSH 2.14 03/05/2017   TSH 1.27 02/10/2016   TSH 1.35 11/02/2014   TSH 1.64 05/19/2013   TSH 1.21 12/16/2011   TSH 1.27 08/01/2010   FREET4 0.70 07/18/2021           Assessment & Plan:   1) Type 2 Diabetes mellitus without  complication without long-term current use of insulin   He presents today, accompanied by his wife, with his logs showing above target fasting glycemic profile.  His POCT A1c today is 8.1%, essentially unchanged from last visit.  He just transitioned to the Mounjaro  about 3 weeks ago due to insurance issues.  He reports he is tolerating it well.  He denies any s/s of hypoglycemia.  [name] [name] has currently uncontrolled symptomatic type 2 DM since 68 years of age.   -Recent labs reviewed.   - I had a long discussion with him about the progressive nature of diabetes and the pathology behind its complications. -his diabetes is complicated by CAD with stents, CVA and he remains at a high risk for more acute and chronic complications which include CAD, CVA, CKD, retinopathy, and neuropathy. These are all discussed in detail with him.  The following Lifestyle Medicine recommendations according to American College of Lifestyle Medicine [facility]) were discussed and offered to patient and he agrees to start the journey:  A. Whole Foods, Plant-based plate comprising of fruits and vegetables, plant-based proteins, whole-grain carbohydrates was discussed in detail with the patient.   A list for source of those nutrients were also provided to the patient.  Patient will use only water  or unsweetened tea for hydration. B.  The need to stay away from risky substances including alcohol, smoking; obtaining 7 to 9 hours of restorative sleep, at least 150 minutes of moderate intensity exercise weekly, the importance of healthy social connections,  and stress reduction techniques were discussed. C.  A full color page of  Calorie density of various food groups per pound showing examples of each food groups was provided to the patient.  - Nutritional counseling repeated at each appointment due to patients tendency to fall back in to old habits.  - The patient admits there is a room for improvement in their diet and  drink choices. -  Suggestion is made for the patient to avoid simple carbohydrates from their diet including Cakes, Sweet Desserts / Pastries, Ice Cream, Soda (diet and regular), Sweet Tea, Candies, Chips, Cookies, Sweet Pastries, Store Bought Juices, Alcohol in Excess of 1-2 drinks a day, Artificial Sweeteners, Coffee Creamer, and Sugar-free Products. This will help patient to have stable blood glucose profile and potentially avoid unintended weight gain.   - I encouraged the patient to switch to unprocessed or minimally processed complex starch and increased protein intake (animal or plant source), fruits, and vegetables.   - Patient is advised to stick to a routine mealtimes to eat 3 meals a day and avoid unnecessary snacks (to snack only to correct hypoglycemia).  - I have approached him with the following individualized plan to manage his diabetes and patient agrees:   -He is advised to continue Metformin  500 md XR twice daily with meal and Mounjaro  7.5 mg SQ weekly which tends to have more potent weight loss benefits and may help with his liver issues as well.  I advised him to stop the Glipizide  today.   -he is encouraged to continue monitoring glucose twice daily, before breakfast and before bed, to call the clinic if  he has readings less than 70 or above 200 for 3 tests in a row.  - Adjustment parameters are given to him for hypo and hyperglycemia in writing.  - his Actos , Parlodel , and Jardiance  was previously discontinued, risk outweighs benefit for this patient.  He does not have significant CKD or CHF.  - Specific targets for  A1c; LDL, HDL, and Triglycerides were discussed with the patient.  2) Blood Pressure /Hypertension:  his blood pressure is controlled to target without the use of antihypertensive medications.     3) Lipids/Hyperlipidemia:    Review of his recent lipid panel from 08/12/22 showed controlled LDL at 54 and elevated triglycerides of 211.  he is advised to  continue Simvastatin  40 mg daily at bedtime.  Side effects and precautions discussed with him.  He has annual labs coming up with PCP.  I asked they bring copy of results to the office so we can enter into the system.  4)  Weight/Diet:  his Body mass index is 32.01 kg/m.  -  clearly complicating his diabetes care.   he is a candidate for weight loss. I discussed with him the fact that loss of 5 - 10% of his  current body weight will have the most impact on his diabetes management.  Exercise, and detailed carbohydrates information provided  -  detailed on discharge instructions.  5) Chronic Care/Health Maintenance: -he is not on ACEI/ARB and is on Statin medications and is encouraged to initiate and continue to follow up with Ophthalmology, Dentist, Podiatrist at least yearly or according to recommendations, and advised to stay away from smoking. I have recommended yearly flu vaccine and pneumonia vaccine at least every 5 years; moderate intensity exercise for up to 150 minutes weekly; and sleep for at least 7 hours a day.  - he is advised to maintain close follow up with Trudy Vaughn FALCON, MD for primary care needs, as well as his other providers for optimal and coordinated care.     I spent  30  minutes in the care of the patient today including review of labs from CMP, Lipids, Thyroid  Function, Hematology (current and previous including abstractions from other facilities); face-to-face time discussing  his blood glucose readings/logs, discussing hypoglycemia and hyperglycemia episodes and symptoms, medications doses, his options of short and long term treatment based on the latest standards of care / guidelines;  discussion about incorporating lifestyle medicine;  and documenting the encounter. Risk reduction counseling performed per USPSTF guidelines to reduce obesity and cardiovascular risk factors.     Please refer to Patient Instructions for Blood Glucose Monitoring and Insulin /Medications  Dosing Guide  in media tab for additional information. Please  also refer to  Patient Self Inventory in the Media  tab for reviewed elements of pertinent patient history.  [name] [name] participated in the discussions, expressed understanding, and voiced agreement with the above plans.  All questions were answered to his satisfaction. he is encouraged to contact clinic should he have any questions or concerns prior to his return visit.     Follow up plan: - Return in about 3 months (around 08/09/2024) for Diabetes F/U with A1c in office, No previsit labs, Bring meter and logs.   Benton Rio, [facility] [address] [place], [place] [address] Phone: [phone] Fax: [phone]  05/09/2024, 11:40 AM

## 2024-05-16 DIAGNOSIS — E119 Type 2 diabetes mellitus without complications: Secondary | ICD-10-CM | POA: Diagnosis not present

## 2024-05-16 DIAGNOSIS — E7800 Pure hypercholesterolemia, unspecified: Secondary | ICD-10-CM | POA: Diagnosis not present

## 2024-05-16 DIAGNOSIS — E7801 Familial hypercholesterolemia: Secondary | ICD-10-CM | POA: Diagnosis not present

## 2024-05-16 DIAGNOSIS — I2510 Atherosclerotic heart disease of native coronary artery without angina pectoris: Secondary | ICD-10-CM | POA: Diagnosis not present

## 2024-05-16 DIAGNOSIS — Z6831 Body mass index (BMI) 31.0-31.9, adult: Secondary | ICD-10-CM | POA: Diagnosis not present

## 2024-05-16 DIAGNOSIS — R251 Tremor, unspecified: Secondary | ICD-10-CM | POA: Diagnosis not present

## 2024-05-16 DIAGNOSIS — Z0000 Encounter for general adult medical examination without abnormal findings: Secondary | ICD-10-CM | POA: Diagnosis not present

## 2024-06-13 ENCOUNTER — Telehealth: Payer: Self-pay | Admitting: *Deleted

## 2024-06-13 DIAGNOSIS — H52223 Regular astigmatism, bilateral: Secondary | ICD-10-CM | POA: Diagnosis not present

## 2024-06-13 NOTE — Telephone Encounter (Signed)
 Patient's wife left a message that patient's blood sugars were running high, he has also been on the Mounjaro  but the blood glucose readings are still running high. Checked and the patient has the free style libre reader. I have called and just left a message bout this. Seeing if she could bring his CGM in so that we can download it. Patient is injecting the Mounjaro  7.5 weekly, takes the Metformin  500 24 hour tablet 2 times a day. Patient is wanting to be seen before his appointment in November,

## 2024-06-14 NOTE — Telephone Encounter (Signed)
 Noted

## 2024-06-20 ENCOUNTER — Other Ambulatory Visit: Payer: Self-pay | Admitting: Nurse Practitioner

## 2024-07-11 ENCOUNTER — Ambulatory Visit (HOSPITAL_COMMUNITY)
Admission: RE | Admit: 2024-07-11 | Discharge: 2024-07-11 | Disposition: A | Discharge status: Home / Self Care | Source: Ambulatory Visit | Attending: Internal Medicine | Admitting: Internal Medicine

## 2024-07-11 DIAGNOSIS — Z122 Encounter for screening for malignant neoplasm of respiratory organs: Secondary | ICD-10-CM | POA: Insufficient documentation

## 2024-07-11 DIAGNOSIS — Z87891 Personal history of nicotine dependence: Secondary | ICD-10-CM | POA: Diagnosis not present

## 2024-07-14 ENCOUNTER — Other Ambulatory Visit: Payer: Self-pay

## 2024-07-14 DIAGNOSIS — Z87891 Personal history of nicotine dependence: Secondary | ICD-10-CM

## 2024-07-14 DIAGNOSIS — Z122 Encounter for screening for malignant neoplasm of respiratory organs: Secondary | ICD-10-CM

## 2024-07-21 ENCOUNTER — Ambulatory Visit: Attending: Cardiology | Admitting: Cardiology

## 2024-07-21 ENCOUNTER — Encounter: Payer: Self-pay | Admitting: Cardiology

## 2024-07-21 VITALS — BP 132/78 | HR 72 | Ht 72.0 in | Wt 238.0 lb

## 2024-07-21 DIAGNOSIS — I25119 Atherosclerotic heart disease of native coronary artery with unspecified angina pectoris: Secondary | ICD-10-CM

## 2024-07-21 DIAGNOSIS — I10 Essential (primary) hypertension: Secondary | ICD-10-CM

## 2024-07-21 DIAGNOSIS — E782 Mixed hyperlipidemia: Secondary | ICD-10-CM

## 2024-07-21 NOTE — Progress Notes (Signed)
    Cardiology Office Note  Date: 07/21/2024   ID: RUTH TULLY, DOB [birth year], MRN [id number]  History of Present Illness: [name] [name] is a 68 y.o. male last seen in April.  He is here with his wife for a follow-up visit.  Reports no angina or interval nitroglycerin  use.  Staying busy with projects and yard work.  No increasing dyspnea on exertion.  He continues to follow with Dr. Trudy at [facility].  I went over his medications which are overall stable from a cardiac perspective.  He is now on Mounjaro  most recently for treatment of diabetes with continued follow-up by endocrinology.  I reviewed his lipid panel from August which looked good.  I reviewed his ECG today which shows normal sinus rhythm.  Physical Exam: VS:  BP 132/78 (BP Location: Right Arm)   Pulse 72   Ht 6' (1.829 m)   Wt 238 lb (108 kg)   BMI 32.28 kg/m , BMI Body mass index is 32.28 kg/m.  Wt Readings from Last 3 Encounters:  07/21/24 238 lb (108 kg)  05/09/24 236 lb (107 kg)  02/09/24 236 lb (107 kg)    General: Patient appears comfortable at rest. HEENT: Conjunctiva and lids normal. Neck: Supple, no elevated JVP or carotid bruits. Lungs: Clear to auscultation, nonlabored breathing at rest. Cardiac: Regular rate and rhythm, no S3 or significant systolic murmur. Extremities: No pitting edema.  ECG:  An ECG dated 07/06/2023 was personally reviewed today and demonstrated:  Sinus rhythm.  Labwork: 01/19/2024: ALT 38; AST 34; Hemoglobin 14.9; Platelets 140.0 01/28/2024: BUN 16; Creatinine, Ser 1.05; Potassium 4.7; Sodium 135 05/09/2024: TSH 1.66  August 2025: Hemoglobin 15.1, platelets 148, potassium 5.1, BUN 21, creatinine 1.22, GFR 64, AST 30, ALT 48, hemoglobin A1c 8.2%, cholesterol 120, triglycerides 106, HDL 59, LDL 39, TSH 1.66  Other Studies Reviewed Today:  No interval cardiac testing for review today.  Assessment and Plan:  1.  CAD status post DES to the RCA in 2005 and DES to the RCA in  July 2023.  He remains symptomatically stable, no interval angina or nitroglycerin  use.  ECG reviewed.  Continue aspirin  81 mg daily, Zocor  40 mg daily, Zetia 10 mg daily, and as needed nitroglycerin .   2.  Mixed hyperlipidemia.  LDL 39 and HDL 59 in August.  Continue Zocor  40 mg daily and Zetia 10 mg daily.   3.  Primary hypertension.  Continue with current regimen including Cozaar  25 mg daily.   4.  Type 2 diabetes mellitus following with [facility].  Currently on Glucotrol  XL 5 mg daily, Glucophage  XR 500 mg twice daily, and Mounjaro  7.5 mg weekly.  Hemoglobin A1c was 8.2% in August.  Disposition:  Follow up 6 months.  Signed, Jayson JUDITHANN Sierras, M.D., F.A.C.C. [facility]

## 2024-07-21 NOTE — Patient Instructions (Signed)
 Medication Instructions:  Your physician recommends that you continue on your current medications as directed. Please refer to the Current Medication list given to you today.   Labwork: None today  Testing/Procedures: None today  Follow-Up: 6 months  Any Other Special Instructions Will Be Listed Below (If Applicable).  If you need a refill on your cardiac medications before your next appointment, please call your pharmacy.

## 2024-07-30 ENCOUNTER — Other Ambulatory Visit: Payer: Self-pay | Admitting: Cardiology

## 2024-08-09 ENCOUNTER — Ambulatory Visit

## 2024-08-09 VITALS — BP 155/70 | HR 72 | Resp 20 | Ht 72.0 in | Wt 236.0 lb

## 2024-08-09 DIAGNOSIS — I7121 Aneurysm of the ascending aorta, without rupture: Secondary | ICD-10-CM | POA: Diagnosis not present

## 2024-08-09 DIAGNOSIS — R911 Solitary pulmonary nodule: Secondary | ICD-10-CM | POA: Diagnosis not present

## 2024-08-09 NOTE — Patient Instructions (Signed)

## 2024-08-09 NOTE — Progress Notes (Signed)
 [address] [place] [address]             [phone]            ALA CAPRI [id number] 1955/11/01   History of Present Illness:  [name] [name] is a 68 year old man with medical history of hypertension, CAD, pulmonary nodule, GERD, type 2 diabetes, essential tremor, former smoker and hyperlipidemia who presents for continued follow up pulmonary nodules and ascending thoracic aortic aneurysm. Aneurysm has stayed stable in size and measured 4.0 cm on recent CT scan of chest. Pulmonary nodules have also stayed similar in size and no new nodules.  He presents to the clinic today with his wife.  His blood pressure is slightly elevated at today's visit but reports that it is usually under 140s/90s.  He does lift heavier items for his job. He experiences shortness of breath with exertion and this has not worsened.  He denies chest pain and lower leg edema.    Current Outpatient Medications on File Prior to Visit  Medication Sig Dispense Refill   Accu-Chek Softclix Lancets lancets USE EVERY MORNING AND AT BEDTIME 100 each 3   aspirin  EC 81 MG tablet Take 1 tablet (81 mg total) by mouth daily. 90 tablet 3   Blood Glucose Monitoring Suppl (ONETOUCH VERIO) w/Device KIT Use to test BG qid. E11.65 1 kit 0   Continuous Glucose Sensor (FREESTYLE LIBRE 3 PLUS SENSOR) MISC Change sensor every 15 days. 6 each 3   ezetimibe (ZETIA) 10 MG tablet Take 10 mg by mouth daily.     fish oil-omega-3 fatty acids 1000 MG capsule Take 1 g by mouth 2 (two) times daily.     glipiZIDE  (GLUCOTROL  XL) 5 MG 24 hr tablet Take 5 mg by mouth daily.     losartan  (COZAAR ) 25 MG tablet Take 1 tablet (25 mg total) by mouth daily. 90 tablet 3   metFORMIN  (GLUCOPHAGE -XR) 500 MG 24 hr tablet Take 1 tablet (500 mg total) by mouth 2 (two) times daily with a meal. 180 tablet 0   nitroGLYCERIN  (NITROSTAT ) 0.4 MG SL tablet DISSOLVE 1 TABLET UNDER THE TONGUE EVERY 5 MINUTES FOR 3 DOSES AS  NEEDED. MAY REPEAT EVERY 5 MINUTES FOR UP TO 3 DOSES. 25 tablet 3   NON FORMULARY Omega XL twice daily     pantoprazole  (PROTONIX ) 40 MG tablet TAKE 1 TABLET(40 MG) BY MOUTH DAILY 90 tablet 0   simvastatin  (ZOCOR ) 40 MG tablet TAKE 1 TABLET(40 MG) BY MOUTH EVERY EVENING 90 tablet 1   tirzepatide  (MOUNJARO ) 7.5 MG/0.5ML Pen Inject 7.5 mg into the skin once a week. 6 mL 1   No current facility-administered medications on file prior to visit.     ROS: Review of Systems  Constitutional:  Negative for malaise/fatigue.  Respiratory:  Negative for cough and shortness of breath.        Shortness of breath on exertion  Cardiovascular:  Negative for chest pain and leg swelling.     BP (!) 155/70   Pulse 72   Resp 20   Ht 6' (1.829 m)   Wt 236 lb (107 kg)   SpO2 97% Comment: RA  BMI 32.01 kg/m   Physical Exam Constitutional:      Appearance: Normal appearance.  HENT:     Head: Normocephalic and atraumatic.  Cardiovascular:     Rate and Rhythm: Normal rate and regular rhythm.  Heart sounds: Normal heart sounds, S1 normal and S2 normal.  Pulmonary:     Effort: Pulmonary effort is normal.     Breath sounds: Normal breath sounds.  Skin:    General: Skin is warm and dry.  Neurological:     General: No focal deficit present.     Mental Status: He is alert and oriented to person, place, and time.      Imaging: CLINICAL DATA:  Are former 84 pack-year smoker, quit 14 years ago.   EXAM: CT CHEST WITHOUT CONTRAST LOW-DOSE FOR LUNG CANCER SCREENING   TECHNIQUE: Multidetector CT imaging of the chest was performed following the standard protocol without IV contrast.   RADIATION DOSE REDUCTION: This exam was performed according to the departmental dose-optimization program which includes automated exposure control, adjustment of the mA and/or kV according to patient size and/or use of iterative reconstruction technique.   COMPARISON:  07/07/2023.   FINDINGS: Cardiovascular:  Atherosclerotic calcification of the aorta, aortic valve and coronary arteries. Heart size normal. No pericardial effusion. Ascending aorta measures 4.0 cm (coronal image 145), stable.   Mediastinum/Nodes: No pathologically enlarged mediastinal or axillary lymph nodes. Hilar regions are difficult to definitively evaluate without IV contrast. Esophagus is grossly unremarkable.   Lungs/Pleura: Centrilobular and paraseptal emphysema. Scattered pulmonary parenchymal scarring. Pulmonary nodules measure 4.6 mm or less in size, as before. No new pulmonary nodules. Airway is unremarkable.   Upper Abdomen: Visualized portions of the liver, gallbladder, adrenal glands, spleen, pancreas, stomach and bowel are grossly unremarkable. No upper abdominal adenopathy.   Musculoskeletal: Degenerative changes in the spine.   IMPRESSION: 1. Lung-RADS 2, benign appearance or behavior. Continue annual screening with low-dose chest CT without contrast in 12 months. 2. 4.0 cm ascending aortic aneurysm, stable. This can be reassessed on annual low-dose lung cancer screening CT. 3. Aortic atherosclerosis (ICD10-I70.0). Coronary artery calcification. 4.  Emphysema (ICD10-J43.9).     Electronically Signed   By: Newell Eke M.D.   On: 07/14/2024 11:03     A/P:  Aneurysm of ascending aorta without rupture -4.0 cm ascending thoracic aortic aneurysm on CT of chest. We discussed the natural history and and risk factors for growth of ascending aortic aneurysms. Discussed recommendations to minimize the risk of further expansion or dissection including careful blood pressure control, avoidance of contact sports and heavy lifting, attention to lipid management.  We covered the importance of continued smoking cessation.  The patient does not yet meet surgical criteria of >5.5cm. The patient is aware of signs and symptoms of aortic dissection and when to present to the emergency department   Pulmonary  nodule -4.6 mm or less in size pulmonary nodules which are stable in size.  Continue with yearly CT scan  -Follow up in one year with lose dose CT scan for continue surveillance of aneurysm and pulmonary nodules.  Virtual visit to discuss results   Risk Modification:  Statin:  simvastatin   Smoking cessation instruction/counseling given:  commended patient for quitting and reviewed strategies for preventing relapses  Patient was counseled on importance of Blood Pressure Control  They are instructed to contact their Primary Care Physician if they start to have blood pressure readings over 130s/90s. Do not ever stop blood pressure medications on your own, unless instructed by healthcare professional.  Please avoid use of Fluoroquinolones as this can potentially increase your risk of Aortic Rupture and/or Dissection  Patient educated on signs and symptoms of Aortic Dissection, handout also provided in AVS  Manuelita CHRISTELLA Rough,  PA-C 08/09/24

## 2024-08-15 ENCOUNTER — Ambulatory Visit: Admitting: Nurse Practitioner

## 2024-08-15 ENCOUNTER — Encounter: Payer: Self-pay | Admitting: Nurse Practitioner

## 2024-08-15 VITALS — BP 114/70 | HR 66 | Ht 72.0 in | Wt 232.4 lb

## 2024-08-15 DIAGNOSIS — Z1331 Encounter for screening for depression: Secondary | ICD-10-CM | POA: Diagnosis not present

## 2024-08-15 DIAGNOSIS — E782 Mixed hyperlipidemia: Secondary | ICD-10-CM | POA: Diagnosis not present

## 2024-08-15 DIAGNOSIS — Z7984 Long term (current) use of oral hypoglycemic drugs: Secondary | ICD-10-CM

## 2024-08-15 DIAGNOSIS — Z7985 Long-term (current) use of injectable non-insulin antidiabetic drugs: Secondary | ICD-10-CM | POA: Diagnosis not present

## 2024-08-15 DIAGNOSIS — Z0001 Encounter for general adult medical examination with abnormal findings: Secondary | ICD-10-CM | POA: Diagnosis not present

## 2024-08-15 DIAGNOSIS — E119 Type 2 diabetes mellitus without complications: Secondary | ICD-10-CM | POA: Diagnosis not present

## 2024-08-15 DIAGNOSIS — Z6831 Body mass index (BMI) 31.0-31.9, adult: Secondary | ICD-10-CM | POA: Diagnosis not present

## 2024-08-15 DIAGNOSIS — Z1389 Encounter for screening for other disorder: Secondary | ICD-10-CM | POA: Diagnosis not present

## 2024-08-15 LAB — POCT GLYCOSYLATED HEMOGLOBIN (HGB A1C): Hemoglobin A1C: 8.8 % — AB (ref 4.0–5.6)

## 2024-08-15 MED ORDER — TIRZEPATIDE 10 MG/0.5ML ~~LOC~~ SOAJ: 10.0000 mg | SUBCUTANEOUS | 1 refills | Status: AC

## 2024-08-15 NOTE — Progress Notes (Signed)
 Endocrinology Follow Up Note       08/15/2024, 2:48 PM   Subjective:    Patient ID: [name] [name], male    DOB: 13-Nov-1955.  [name] [name] is being seen in follow up after being seen in consultation for management of currently uncontrolled symptomatic diabetes requested by  Trudy Vaughn FALCON, MD.   Past Medical History:  Diagnosis Date   Arthritis    Cholelithiasis    Coronary atherosclerosis of native coronary artery    a. s/p DES to RCA in 2005 b. 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   Type 2 diabetes mellitus (HCC)    Urolithiasis     Past Surgical History:  Procedure Laterality Date   Colonoscopy     COLONOSCOPY     CORONARY STENT INTERVENTION N/A 04/16/2022   Procedure: CORONARY STENT INTERVENTION;  Surgeon: Jordan, Peter M, MD;  Location: [facility] INVASIVE CV LAB;  Service: Cardiovascular;  Laterality: N/A;   CORONARY STENT PLACEMENT  2005   CYSTOSCOPY W/ URETERAL STENT PLACEMENT Right 06/22/2023   Procedure: CYSTOSCOPY WITH RETROGRADE PYELOGRAM/URETERAL STENT PLACEMENT;  Surgeon: Sherrilee Belvie CROME, MD;  Location: [facility];  Service: Urology;  Laterality: Right;   LEFT HEART CATH AND CORONARY ANGIOGRAPHY N/A 04/16/2022   Procedure: LEFT HEART CATH AND CORONARY ANGIOGRAPHY;  Surgeon: Jordan, Peter M, MD;  Location: [facility] INVASIVE CV LAB;  Service: Cardiovascular;  Laterality: N/A;   STONE EXTRACTION WITH BASKET Right 06/22/2023   Procedure: STONE EXTRACTION WITH BASKET;  Surgeon: Sherrilee Belvie CROME, MD;  Location: [facility];  Service: Urology;  Laterality: Right;   Testicular tumor resection     Right side    Social History   Socioeconomic History   Marital status:  Married    Spouse name: [name]   Number of children: 0   Years of education: Not on file   Highest education level: Not on file  Occupational History   Occupation: Truck driver  Tobacco Use   Smoking status: Former    Current packs/day: 0.00    Average packs/day: 2.0 packs/day for 43.0 years (86.0 ttl pk-yrs)    Types: Cigarettes    Start date: 04/22/1967    Quit date: 04/21/2010    Years since quitting: 14.3   Smokeless tobacco: Never  Vaping Use   Vaping status: Never Used  Substance and Sexual Activity   Alcohol use: Yes    Comment: occasionally   Drug use: No   Sexual activity: Not on file  Other Topics Concern   Not on file  Social History Narrative   Retired naval architect   Married   No EtOH or very minimal   No children   Former smoker   No drug use   Social Drivers of Corporate Investment Banker Strain: Not on file  Food Insecurity: No Food Insecurity (06/22/2023)   Hunger Vital Sign    Worried About Running Out of Food  in the Last Year: Never true    Ran Out of Food in the Last Year: Never true  Transportation Needs: No Transportation Needs (06/22/2023)   PRAPARE - Administrator, Civil Service (Medical): No    Lack of Transportation (Non-Medical): No  Physical Activity: Not on file  Stress: Not on file  Social Connections: Not on file    Family History  Problem Relation Age of Onset   Heart disease Father    Diabetes Father    Heart attack Father    Colon cancer Neg Hx    Esophageal cancer Neg Hx    Rectal cancer Neg Hx    Stomach cancer Neg Hx    Colon polyps Neg Hx     Outpatient Encounter Medications as of 08/15/2024  Medication Sig   Accu-Chek Softclix Lancets lancets USE EVERY MORNING AND AT BEDTIME   aspirin  EC 81 MG tablet Take 1 tablet (81 mg total) by mouth daily.   Blood Glucose Monitoring Suppl (ONETOUCH VERIO) w/Device KIT Use to test BG qid. E11.65   Continuous Glucose Sensor (FREESTYLE LIBRE 3 PLUS SENSOR) MISC Change  sensor every 15 days.   ezetimibe (ZETIA) 10 MG tablet Take 10 mg by mouth daily.   fish oil-omega-3 fatty acids 1000 MG capsule Take 1 g by mouth 2 (two) times daily.   losartan  (COZAAR ) 25 MG tablet Take 1 tablet (25 mg total) by mouth daily.   metFORMIN  (GLUCOPHAGE -XR) 500 MG 24 hr tablet Take 1 tablet (500 mg total) by mouth 2 (two) times daily with a meal.   nitroGLYCERIN  (NITROSTAT ) 0.4 MG SL tablet DISSOLVE 1 TABLET UNDER THE TONGUE EVERY 5 MINUTES FOR 3 DOSES AS NEEDED. MAY REPEAT EVERY 5 MINUTES FOR UP TO 3 DOSES.   NON FORMULARY Omega XL twice daily   pantoprazole  (PROTONIX ) 40 MG tablet TAKE 1 TABLET(40 MG) BY MOUTH DAILY   simvastatin  (ZOCOR ) 40 MG tablet TAKE 1 TABLET(40 MG) BY MOUTH EVERY EVENING   tirzepatide  (MOUNJARO ) 10 MG/0.5ML Pen Inject 10 mg into the skin once a week.   [DISCONTINUED] tirzepatide  (MOUNJARO ) 7.5 MG/0.5ML Pen Inject 7.5 mg into the skin once a week.   glipiZIDE  (GLUCOTROL  XL) 5 MG 24 hr tablet Take 5 mg by mouth daily. (Patient not taking: Reported on 08/15/2024)   No facility-administered encounter medications on file as of 08/15/2024.    ALLERGIES: Allergies  Allergen Reactions   Quinolones Other (See Comments)    Ascending thoracic aortic aneurysm, use with caution    VACCINATION STATUS: Immunization History  Administered Date(s) Administered   Pneumococcal Polysaccharide-23 07/15/2009   Tdap 03/05/2017   Zoster, Live 02/10/2016    Diabetes He presents for his follow-up diabetic visit. He has type 2 diabetes mellitus. Onset time: diagnosed at approx age of 50. His disease course has been stable. There are no hypoglycemic associated symptoms. There are no hypoglycemic complications. Diabetic complications include a CVA and heart disease (CAD). Risk factors for coronary artery disease include diabetes mellitus, dyslipidemia, family history, obesity, male sex, hypertension and sedentary lifestyle. Current diabetic treatment includes oral agent  (monotherapy) and diet (Mounjaro ). He is compliant with treatment most of the time. His weight is fluctuating minimally. He is following a generally healthy diet. When asked about meal planning, he reported none. He has not had a previous visit with a dietitian. He rarely participates in exercise. His home blood glucose trend is increasing steadily. His overall blood glucose range is >200 mg/dl. (He presents today, accompanied  by his wife, with his logs showing above target fasting glycemic profile.  His POCT A1c today is 8.8%, increasing from last visit of 8.1%.  He continues to eat what he wants but has noticed he doesn't eat as much at each sitting.  Analysis of his CGM shows TIR 13%, TAR 87%, TBR 0% with a GMI of 8.7%.) An ACE inhibitor/angiotensin II receptor blocker is not being taken. He sees a podiatrist.Eye exam is current.    Review of systems  Constitutional: +decreasing body weight,  current Body mass index is 31.52 kg/m. , no fatigue, no subjective hyperthermia, no subjective hypothermia Eyes: no blurry vision, no xerophthalmia ENT: no sore throat, no nodules palpated in throat, no dysphagia/odynophagia, no hoarseness Cardiovascular: no chest pain, no shortness of breath, no palpitations, no leg swelling Respiratory: no cough, no shortness of breath Gastrointestinal: no nausea/vomiting/diarrhea Musculoskeletal: no muscle/joint aches Skin: no rashes, no hyperemia Neurological: no tremors, no numbness, no tingling, no dizziness Psychiatric: no depression, no anxiety  Objective:     BP 114/70 (BP Location: Left Arm, Patient Position: Sitting, Cuff Size: Large)   Pulse 66   Ht 6' (1.829 m)   Wt 232 lb 6.4 oz (105.4 kg)   BMI 31.52 kg/m   Wt Readings from Last 3 Encounters:  08/15/24 232 lb 6.4 oz (105.4 kg)  08/09/24 236 lb (107 kg)  07/21/24 238 lb (108 kg)     BP Readings from Last 3 Encounters:  08/15/24 114/70  08/09/24 (!) 155/70  07/21/24 132/78      Physical  Exam- Limited  Constitutional:  Body mass index is 31.52 kg/m. , not in acute distress, normal state of mind Eyes:  EOMI, no exophthalmos Musculoskeletal: no gross deformities, strength intact in all four extremities, no gross restriction of joint movements Skin:  no rashes, no hyperemia Neurological: no tremor with outstretched hands   Diabetic Foot Exam - Simple   No data filed     CMP ( most recent) CMP     Component Value Date/Time   NA 135 01/28/2024 0915   NA 140 07/21/2023 1354   K 4.7 01/28/2024 0915   CL 104 01/28/2024 0915   CO2 23 01/28/2024 0915   GLUCOSE 201 (H) 01/28/2024 0915   GLUCOSE 107 (H) 07/09/2006 1049   BUN 16 01/28/2024 0915   BUN 15 07/21/2023 1354   CREATININE 1.05 01/28/2024 0915   CALCIUM 9.3 01/28/2024 0915   PROT 7.1 01/19/2024 1050   PROT 6.6 07/21/2023 1354   ALBUMIN 4.4 01/19/2024 1050   ALBUMIN 4.2 07/21/2023 1354   AST 34 01/19/2024 1050   ALT 38 01/19/2024 1050   ALKPHOS 59 01/19/2024 1050   BILITOT 0.8 01/19/2024 1050   BILITOT 0.6 07/21/2023 1354   GFRNONAA >60 01/28/2024 0915   GFRAA >60 10/26/2015 1936     Diabetic Labs (most recent): Lab Results  Component Value Date   HGBA1C 8.8 (A) 08/15/2024   HGBA1C 8.2 (A) 02/01/2024   HGBA1C 8.2 (A) 11/04/2023   MICROALBUR 30 mg/L 04/20/2023   MICROALBUR 1.3 06/13/2009   MICROALBUR 0.2 07/05/2008     Lipid Panel ( most recent) Lipid Panel     Component Value Date/Time   CHOL 151 08/12/2022 1502   TRIG 211 (H) 08/12/2022 1502   TRIG 96 07/09/2006 1049   HDL 55 08/12/2022 1502   CHOLHDL 2.7 08/12/2022 1502   VLDL 42 (H) 08/12/2022 1502   LDLCALC 54 08/12/2022 1502   LDLDIRECT 57.0 04/01/2018 1408  Lab Results  Component Value Date   TSH 1.66 05/09/2024   TSH 2.100 06/17/2023   TSH 1.18 07/18/2021   TSH 1.27 01/26/2020   TSH 2.34 04/01/2018   TSH 2.14 03/05/2017   TSH 1.27 02/10/2016   TSH 1.35 11/02/2014   TSH 1.64 05/19/2013   TSH 1.21 12/16/2011    FREET4 0.70 07/18/2021           Assessment & Plan:   1) Type 2 Diabetes mellitus without complication without long-term current use of insulin   He presents today, accompanied by his wife, with his logs showing above target fasting glycemic profile.  His POCT A1c today is 8.8%, increasing from last visit of 8.1%.  He continues to eat what he wants but has noticed he doesn't eat as much at each sitting.  Analysis of his CGM shows TIR 13%, TAR 87%, TBR 0% with a GMI of 8.7%.  - EDUIN FRIEDEL has currently uncontrolled symptomatic type 2 DM since 68 years of age.   -Recent labs reviewed.   - I had a long discussion with him about the progressive nature of diabetes and the pathology behind its complications. -his diabetes is complicated by CAD with stents, CVA and he remains at a high risk for more acute and chronic complications which include CAD, CVA, CKD, retinopathy, and neuropathy. These are all discussed in detail with him.  The following Lifestyle Medicine recommendations according to American College of Lifestyle Medicine [facility]) were discussed and offered to patient and he agrees to start the journey:  A. Whole Foods, Plant-based plate comprising of fruits and vegetables, plant-based proteins, whole-grain carbohydrates was discussed in detail with the patient.   A list for source of those nutrients were also provided to the patient.  Patient will use only water  or unsweetened tea for hydration. B.  The need to stay away from risky substances including alcohol, smoking; obtaining 7 to 9 hours of restorative sleep, at least 150 minutes of moderate intensity exercise weekly, the importance of healthy social connections,  and stress reduction techniques were discussed. C.  A full color page of  Calorie density of various food groups per pound showing examples of each food groups was provided to the patient.  - Nutritional counseling repeated/built upon at each appointment.  - The patient  admits there is a room for improvement in their diet and drink choices. -  Suggestion is made for the patient to avoid simple carbohydrates from their diet including Cakes, Sweet Desserts / Pastries, Ice Cream, Soda (diet and regular), Sweet Tea, Candies, Chips, Cookies, Sweet Pastries, Store Bought Juices, Alcohol in Excess of 1-2 drinks a day, Artificial Sweeteners, Coffee Creamer, and Sugar-free Products. This will help patient to have stable blood glucose profile and potentially avoid unintended weight gain.   - I encouraged the patient to switch to unprocessed or minimally processed complex starch and increased protein intake (animal or plant source), fruits, and vegetables.   - Patient is advised to stick to a routine mealtimes to eat 3 meals a day and avoid unnecessary snacks (to snack only to correct hypoglycemia).  - I have approached him with the following individualized plan to manage his diabetes and patient agrees:   -He is advised to continue Metformin  500 md XR twice daily with meal and will increase his Mounjaro  to 10 mg SQ weekly.  He can alternate between the 7.5 mg dose and the 10 mg dose until he depletes his current supply (that way he gets  the benefit of higher dose starting now).  He can stay off Glipizide  for now but is to let me know if he cannot get the higher dose of Mounjaro  right away as we may need to reinitiate temporarily.  -he is encouraged to continue monitoring glucose twice daily, before breakfast and before bed, to call the clinic if he has readings less than 70 or above 200 for 3 tests in a row.  - Adjustment parameters are given to him for hypo and hyperglycemia in writing.  - his Actos , Parlodel , and Jardiance  was previously discontinued, risk outweighs benefit for this patient.  He does not have significant CKD or CHF.  - Specific targets for  A1c; LDL, HDL, and Triglycerides were discussed with the patient.  2) Blood Pressure /Hypertension:  his blood  pressure is controlled to target without the use of antihypertensive medications.     3) Lipids/Hyperlipidemia:    Review of his recent lipid panel from 08/12/22 showed controlled LDL at 54 and elevated triglycerides of 211.  he is advised to continue Simvastatin  40 mg daily at bedtime.  Side effects and precautions discussed with him.   4)  Weight/Diet:  his Body mass index is 31.52 kg/m.  -  clearly complicating his diabetes care.   he is a candidate for weight loss. I discussed with him the fact that loss of 5 - 10% of his  current body weight will have the most impact on his diabetes management.  Exercise, and detailed carbohydrates information provided  -  detailed on discharge instructions.  5) Chronic Care/Health Maintenance: -he is not on ACEI/ARB and is on Statin medications and is encouraged to initiate and continue to follow up with Ophthalmology, Dentist, Podiatrist at least yearly or according to recommendations, and advised to stay away from smoking. I have recommended yearly flu vaccine and pneumonia vaccine at least every 5 years; moderate intensity exercise for up to 150 minutes weekly; and sleep for at least 7 hours a day.  - he is advised to maintain close follow up with Trudy Vaughn FALCON, MD for primary care needs, as well as his other providers for optimal and coordinated care.     I spent  41  minutes in the care of the patient today including review of labs from CMP, Lipids, Thyroid  Function, Hematology (current and previous including abstractions from other facilities); face-to-face time discussing  his blood glucose readings/logs, discussing hypoglycemia and hyperglycemia episodes and symptoms, medications doses, his options of short and long term treatment based on the latest standards of care / guidelines;  discussion about incorporating lifestyle medicine;  and documenting the encounter. Risk reduction counseling performed per USPSTF guidelines to reduce obesity and  cardiovascular risk factors.     Please refer to Patient Instructions for Blood Glucose Monitoring and Insulin /Medications Dosing Guide  in media tab for additional information. Please  also refer to  Patient Self Inventory in the Media  tab for reviewed elements of pertinent patient history.  [name] [name] participated in the discussions, expressed understanding, and voiced agreement with the above plans.  All questions were answered to his satisfaction. he is encouraged to contact clinic should he have any questions or concerns prior to his return visit.     Follow up plan: - Return in about 4 months (around 12/13/2024) for Diabetes F/U with A1c in office, No previsit labs, Bring meter and logs.   Benton Rio, [facility] [address] [place], [place] [address] Phone:  [phone] Fax: [phone]  08/15/2024, 2:48 PM

## 2024-09-01 DIAGNOSIS — E1159 Type 2 diabetes mellitus with other circulatory complications: Secondary | ICD-10-CM | POA: Diagnosis not present

## 2024-09-01 DIAGNOSIS — R748 Abnormal levels of other serum enzymes: Secondary | ICD-10-CM | POA: Diagnosis not present

## 2024-09-01 DIAGNOSIS — K760 Fatty (change of) liver, not elsewhere classified: Secondary | ICD-10-CM | POA: Diagnosis not present

## 2024-09-01 NOTE — Progress Notes (Signed)
 KIENAN DOUBLIN                                          MRN: [id number]   09/01/2024   The [facility] Quality Team Specialist reviewed this patient medical record for the purposes of chart review for care gap closure. The following were reviewed: chart review for care gap closure-kidney health evaluation for diabetes:eGFR  and uACR.    [facility] Quality Team

## 2024-09-04 ENCOUNTER — Telehealth: Payer: Self-pay | Admitting: *Deleted

## 2024-09-04 NOTE — Telephone Encounter (Signed)
 Patient's wife has left a second message. She on  states that it is about her husbands medication. In a previous message on August 29, 2024, she stated that the Mounjaro  7.5 mg had been increased to 10 mg. Insurance want fill, as insurance told her that we need to  give authorization.  Called the [facility] in [place], spoke with Ty. She states that it wasn't but 10 days between the time the patient was seen in the office and he got his refill. Patient was given a 3 month supply. She recommends that the patient use the 7.5 mg dose until January , then they should be able to fill the new prescription.  Called and talked with Willy , patient's wife. They will wait until January and get the new prescription.

## 2024-09-17 ENCOUNTER — Other Ambulatory Visit: Payer: Self-pay | Admitting: Nurse Practitioner

## 2024-09-30 ENCOUNTER — Other Ambulatory Visit: Payer: Self-pay | Admitting: Nurse Practitioner

## 2024-10-03 ENCOUNTER — Other Ambulatory Visit: Payer: Self-pay | Admitting: Cardiology

## 2024-10-03 DIAGNOSIS — E1151 Type 2 diabetes mellitus with diabetic peripheral angiopathy without gangrene: Secondary | ICD-10-CM

## 2024-12-14 ENCOUNTER — Ambulatory Visit: Admitting: Nurse Practitioner

## 2025-02-13 ENCOUNTER — Other Ambulatory Visit (HOSPITAL_COMMUNITY)

## 2025-02-28 ENCOUNTER — Ambulatory Visit: Admitting: Urology
